# Patient Record
Sex: Male | Born: 1935 | Race: Black or African American | Hispanic: No | Marital: Married | Smoking: Former smoker
Health system: Southern US, Community
[De-identification: ages and names within clinical notes are randomized; demographics above are authoritative.]

## PROBLEM LIST (undated history)

## (undated) DIAGNOSIS — I10 Essential (primary) hypertension: Secondary | ICD-10-CM

## (undated) DIAGNOSIS — M1990 Unspecified osteoarthritis, unspecified site: Secondary | ICD-10-CM

## (undated) DIAGNOSIS — I2510 Atherosclerotic heart disease of native coronary artery without angina pectoris: Secondary | ICD-10-CM

## (undated) DIAGNOSIS — F32A Depression, unspecified: Secondary | ICD-10-CM

## (undated) HISTORY — PX: CARDIAC CATHETERIZATION: SHX172

## (undated) HISTORY — DX: Depression, unspecified: F32.A

---

## 2014-08-31 DIAGNOSIS — I10 Essential (primary) hypertension: Secondary | ICD-10-CM | POA: Insufficient documentation

## 2014-08-31 DIAGNOSIS — E785 Hyperlipidemia, unspecified: Secondary | ICD-10-CM | POA: Insufficient documentation

## 2015-01-14 DIAGNOSIS — H401112 Primary open-angle glaucoma, right eye, moderate stage: Secondary | ICD-10-CM | POA: Diagnosis not present

## 2015-01-14 DIAGNOSIS — H5203 Hypermetropia, bilateral: Secondary | ICD-10-CM | POA: Diagnosis not present

## 2015-02-25 DIAGNOSIS — I10 Essential (primary) hypertension: Secondary | ICD-10-CM | POA: Diagnosis not present

## 2015-02-25 DIAGNOSIS — I25119 Atherosclerotic heart disease of native coronary artery with unspecified angina pectoris: Secondary | ICD-10-CM | POA: Diagnosis not present

## 2015-02-25 DIAGNOSIS — E785 Hyperlipidemia, unspecified: Secondary | ICD-10-CM | POA: Diagnosis not present

## 2015-03-01 DIAGNOSIS — I2510 Atherosclerotic heart disease of native coronary artery without angina pectoris: Secondary | ICD-10-CM | POA: Diagnosis not present

## 2015-03-01 DIAGNOSIS — E785 Hyperlipidemia, unspecified: Secondary | ICD-10-CM | POA: Diagnosis not present

## 2015-03-01 DIAGNOSIS — I10 Essential (primary) hypertension: Secondary | ICD-10-CM | POA: Diagnosis not present

## 2015-03-15 DIAGNOSIS — I159 Secondary hypertension, unspecified: Secondary | ICD-10-CM | POA: Diagnosis not present

## 2015-04-20 DIAGNOSIS — H25019 Cortical age-related cataract, unspecified eye: Secondary | ICD-10-CM | POA: Diagnosis not present

## 2015-04-20 DIAGNOSIS — H401112 Primary open-angle glaucoma, right eye, moderate stage: Secondary | ICD-10-CM | POA: Diagnosis not present

## 2015-07-28 DIAGNOSIS — H401112 Primary open-angle glaucoma, right eye, moderate stage: Secondary | ICD-10-CM | POA: Diagnosis not present

## 2015-07-28 DIAGNOSIS — H2513 Age-related nuclear cataract, bilateral: Secondary | ICD-10-CM | POA: Diagnosis not present

## 2015-08-30 DIAGNOSIS — E785 Hyperlipidemia, unspecified: Secondary | ICD-10-CM | POA: Diagnosis not present

## 2015-08-30 DIAGNOSIS — I2510 Atherosclerotic heart disease of native coronary artery without angina pectoris: Secondary | ICD-10-CM | POA: Diagnosis not present

## 2015-08-30 DIAGNOSIS — I10 Essential (primary) hypertension: Secondary | ICD-10-CM | POA: Diagnosis not present

## 2015-09-28 DIAGNOSIS — Z23 Encounter for immunization: Secondary | ICD-10-CM | POA: Diagnosis not present

## 2015-09-28 DIAGNOSIS — Z86010 Personal history of colonic polyps: Secondary | ICD-10-CM | POA: Diagnosis not present

## 2015-09-28 DIAGNOSIS — I10 Essential (primary) hypertension: Secondary | ICD-10-CM | POA: Diagnosis not present

## 2015-09-28 DIAGNOSIS — K219 Gastro-esophageal reflux disease without esophagitis: Secondary | ICD-10-CM | POA: Diagnosis not present

## 2015-09-28 DIAGNOSIS — N400 Enlarged prostate without lower urinary tract symptoms: Secondary | ICD-10-CM | POA: Diagnosis not present

## 2015-11-09 DIAGNOSIS — H401112 Primary open-angle glaucoma, right eye, moderate stage: Secondary | ICD-10-CM | POA: Diagnosis not present

## 2015-11-09 DIAGNOSIS — H25019 Cortical age-related cataract, unspecified eye: Secondary | ICD-10-CM | POA: Diagnosis not present

## 2015-11-09 DIAGNOSIS — H2513 Age-related nuclear cataract, bilateral: Secondary | ICD-10-CM | POA: Diagnosis not present

## 2015-12-14 DIAGNOSIS — Z1211 Encounter for screening for malignant neoplasm of colon: Secondary | ICD-10-CM | POA: Diagnosis not present

## 2015-12-14 DIAGNOSIS — Z86010 Personal history of colonic polyps: Secondary | ICD-10-CM | POA: Diagnosis not present

## 2016-01-19 DIAGNOSIS — I2510 Atherosclerotic heart disease of native coronary artery without angina pectoris: Secondary | ICD-10-CM | POA: Diagnosis not present

## 2016-01-19 DIAGNOSIS — Z7902 Long term (current) use of antithrombotics/antiplatelets: Secondary | ICD-10-CM | POA: Diagnosis not present

## 2016-01-19 DIAGNOSIS — I10 Essential (primary) hypertension: Secondary | ICD-10-CM | POA: Diagnosis not present

## 2016-01-19 DIAGNOSIS — K5730 Diverticulosis of large intestine without perforation or abscess without bleeding: Secondary | ICD-10-CM | POA: Diagnosis not present

## 2016-01-19 DIAGNOSIS — H409 Unspecified glaucoma: Secondary | ICD-10-CM | POA: Diagnosis not present

## 2016-01-19 DIAGNOSIS — Z87891 Personal history of nicotine dependence: Secondary | ICD-10-CM | POA: Diagnosis not present

## 2016-01-19 DIAGNOSIS — Z79899 Other long term (current) drug therapy: Secondary | ICD-10-CM | POA: Diagnosis not present

## 2016-01-19 DIAGNOSIS — Z7982 Long term (current) use of aspirin: Secondary | ICD-10-CM | POA: Diagnosis not present

## 2016-01-19 DIAGNOSIS — Z1211 Encounter for screening for malignant neoplasm of colon: Secondary | ICD-10-CM | POA: Diagnosis not present

## 2016-01-19 DIAGNOSIS — Z86010 Personal history of colonic polyps: Secondary | ICD-10-CM | POA: Diagnosis not present

## 2016-01-19 DIAGNOSIS — K5790 Diverticulosis of intestine, part unspecified, without perforation or abscess without bleeding: Secondary | ICD-10-CM | POA: Diagnosis not present

## 2016-02-11 DIAGNOSIS — H2513 Age-related nuclear cataract, bilateral: Secondary | ICD-10-CM | POA: Diagnosis not present

## 2016-02-11 DIAGNOSIS — H5203 Hypermetropia, bilateral: Secondary | ICD-10-CM | POA: Diagnosis not present

## 2016-02-11 DIAGNOSIS — H401112 Primary open-angle glaucoma, right eye, moderate stage: Secondary | ICD-10-CM | POA: Diagnosis not present

## 2016-03-06 DIAGNOSIS — E785 Hyperlipidemia, unspecified: Secondary | ICD-10-CM | POA: Diagnosis not present

## 2016-03-06 DIAGNOSIS — I10 Essential (primary) hypertension: Secondary | ICD-10-CM | POA: Diagnosis not present

## 2016-03-06 DIAGNOSIS — I2510 Atherosclerotic heart disease of native coronary artery without angina pectoris: Secondary | ICD-10-CM | POA: Diagnosis not present

## 2016-04-28 DIAGNOSIS — Z1389 Encounter for screening for other disorder: Secondary | ICD-10-CM | POA: Diagnosis not present

## 2016-04-28 DIAGNOSIS — Z79899 Other long term (current) drug therapy: Secondary | ICD-10-CM | POA: Diagnosis not present

## 2016-04-28 DIAGNOSIS — N400 Enlarged prostate without lower urinary tract symptoms: Secondary | ICD-10-CM | POA: Diagnosis not present

## 2016-04-28 DIAGNOSIS — Z0000 Encounter for general adult medical examination without abnormal findings: Secondary | ICD-10-CM | POA: Diagnosis not present

## 2016-04-28 DIAGNOSIS — I10 Essential (primary) hypertension: Secondary | ICD-10-CM | POA: Diagnosis not present

## 2016-04-28 DIAGNOSIS — Z6826 Body mass index (BMI) 26.0-26.9, adult: Secondary | ICD-10-CM | POA: Diagnosis not present

## 2016-04-28 DIAGNOSIS — E785 Hyperlipidemia, unspecified: Secondary | ICD-10-CM | POA: Diagnosis not present

## 2016-04-28 DIAGNOSIS — M79605 Pain in left leg: Secondary | ICD-10-CM | POA: Diagnosis not present

## 2016-04-28 DIAGNOSIS — Z9181 History of falling: Secondary | ICD-10-CM | POA: Diagnosis not present

## 2016-05-30 DIAGNOSIS — M25552 Pain in left hip: Secondary | ICD-10-CM | POA: Diagnosis not present

## 2016-05-30 DIAGNOSIS — R3989 Other symptoms and signs involving the genitourinary system: Secondary | ICD-10-CM | POA: Diagnosis not present

## 2016-05-30 DIAGNOSIS — Z6826 Body mass index (BMI) 26.0-26.9, adult: Secondary | ICD-10-CM | POA: Diagnosis not present

## 2016-06-20 DIAGNOSIS — H25019 Cortical age-related cataract, unspecified eye: Secondary | ICD-10-CM | POA: Diagnosis not present

## 2016-06-20 DIAGNOSIS — H401112 Primary open-angle glaucoma, right eye, moderate stage: Secondary | ICD-10-CM | POA: Diagnosis not present

## 2016-06-20 DIAGNOSIS — H2513 Age-related nuclear cataract, bilateral: Secondary | ICD-10-CM | POA: Diagnosis not present

## 2016-08-31 DIAGNOSIS — I2510 Atherosclerotic heart disease of native coronary artery without angina pectoris: Secondary | ICD-10-CM | POA: Diagnosis not present

## 2016-08-31 DIAGNOSIS — I10 Essential (primary) hypertension: Secondary | ICD-10-CM | POA: Diagnosis not present

## 2016-08-31 DIAGNOSIS — M1610 Unilateral primary osteoarthritis, unspecified hip: Secondary | ICD-10-CM | POA: Diagnosis not present

## 2016-08-31 DIAGNOSIS — Z7982 Long term (current) use of aspirin: Secondary | ICD-10-CM | POA: Diagnosis not present

## 2016-08-31 DIAGNOSIS — K219 Gastro-esophageal reflux disease without esophagitis: Secondary | ICD-10-CM | POA: Diagnosis not present

## 2016-08-31 DIAGNOSIS — Z955 Presence of coronary angioplasty implant and graft: Secondary | ICD-10-CM | POA: Insufficient documentation

## 2016-08-31 DIAGNOSIS — E785 Hyperlipidemia, unspecified: Secondary | ICD-10-CM | POA: Diagnosis not present

## 2016-09-13 DIAGNOSIS — Z7982 Long term (current) use of aspirin: Secondary | ICD-10-CM | POA: Diagnosis not present

## 2016-09-13 DIAGNOSIS — Z955 Presence of coronary angioplasty implant and graft: Secondary | ICD-10-CM | POA: Diagnosis not present

## 2016-09-13 DIAGNOSIS — R110 Nausea: Secondary | ICD-10-CM | POA: Diagnosis not present

## 2016-09-13 DIAGNOSIS — R55 Syncope and collapse: Secondary | ICD-10-CM | POA: Diagnosis not present

## 2016-09-13 DIAGNOSIS — I10 Essential (primary) hypertension: Secondary | ICD-10-CM | POA: Diagnosis not present

## 2016-09-13 DIAGNOSIS — I2510 Atherosclerotic heart disease of native coronary artery without angina pectoris: Secondary | ICD-10-CM | POA: Diagnosis not present

## 2016-09-13 DIAGNOSIS — Z79899 Other long term (current) drug therapy: Secondary | ICD-10-CM | POA: Diagnosis not present

## 2016-09-13 DIAGNOSIS — Z87891 Personal history of nicotine dependence: Secondary | ICD-10-CM | POA: Diagnosis not present

## 2016-09-13 DIAGNOSIS — R404 Transient alteration of awareness: Secondary | ICD-10-CM | POA: Diagnosis not present

## 2016-09-19 ENCOUNTER — Emergency Department (HOSPITAL_COMMUNITY): Payer: PPO

## 2016-09-19 ENCOUNTER — Emergency Department (HOSPITAL_COMMUNITY)
Admission: EM | Admit: 2016-09-19 | Discharge: 2016-09-19 | Disposition: A | Discharge status: Home / Self Care | Payer: PPO | Attending: Emergency Medicine | Admitting: Emergency Medicine

## 2016-09-19 ENCOUNTER — Other Ambulatory Visit: Payer: Self-pay

## 2016-09-19 ENCOUNTER — Encounter (HOSPITAL_COMMUNITY): Payer: Self-pay

## 2016-09-19 DIAGNOSIS — Z87891 Personal history of nicotine dependence: Secondary | ICD-10-CM | POA: Diagnosis not present

## 2016-09-19 DIAGNOSIS — R55 Syncope and collapse: Secondary | ICD-10-CM

## 2016-09-19 DIAGNOSIS — I10 Essential (primary) hypertension: Secondary | ICD-10-CM | POA: Insufficient documentation

## 2016-09-19 DIAGNOSIS — R4020 Unspecified coma: Secondary | ICD-10-CM | POA: Diagnosis not present

## 2016-09-19 DIAGNOSIS — Z7982 Long term (current) use of aspirin: Secondary | ICD-10-CM | POA: Diagnosis not present

## 2016-09-19 DIAGNOSIS — Z79899 Other long term (current) drug therapy: Secondary | ICD-10-CM | POA: Insufficient documentation

## 2016-09-19 DIAGNOSIS — R404 Transient alteration of awareness: Secondary | ICD-10-CM | POA: Diagnosis not present

## 2016-09-19 DIAGNOSIS — R112 Nausea with vomiting, unspecified: Secondary | ICD-10-CM | POA: Diagnosis not present

## 2016-09-19 DIAGNOSIS — R42 Dizziness and giddiness: Secondary | ICD-10-CM | POA: Diagnosis not present

## 2016-09-19 DIAGNOSIS — I2510 Atherosclerotic heart disease of native coronary artery without angina pectoris: Secondary | ICD-10-CM | POA: Insufficient documentation

## 2016-09-19 HISTORY — DX: Unspecified osteoarthritis, unspecified site: M19.90

## 2016-09-19 HISTORY — DX: Atherosclerotic heart disease of native coronary artery without angina pectoris: I25.10

## 2016-09-19 HISTORY — DX: Essential (primary) hypertension: I10

## 2016-09-19 LAB — CBC
HCT: 43.7 % (ref 39.0–52.0)
Hemoglobin: 14.2 g/dL (ref 13.0–17.0)
MCH: 27 pg (ref 26.0–34.0)
MCHC: 32.5 g/dL (ref 30.0–36.0)
MCV: 83.2 fL (ref 78.0–100.0)
Platelets: 210 10*3/uL (ref 150–400)
RBC: 5.25 MIL/uL (ref 4.22–5.81)
RDW: 14.2 % (ref 11.5–15.5)
WBC: 10.5 10*3/uL (ref 4.0–10.5)

## 2016-09-19 LAB — URINALYSIS, ROUTINE W REFLEX MICROSCOPIC
BILIRUBIN URINE: NEGATIVE
Glucose, UA: NEGATIVE mg/dL
HGB URINE DIPSTICK: NEGATIVE
Ketones, ur: 5 mg/dL — AB
Leukocytes, UA: NEGATIVE
NITRITE: NEGATIVE
PROTEIN: NEGATIVE mg/dL
Specific Gravity, Urine: 1.012 (ref 1.005–1.030)
pH: 5 (ref 5.0–8.0)

## 2016-09-19 LAB — BASIC METABOLIC PANEL
Anion gap: 8 (ref 5–15)
BUN: 13 mg/dL (ref 6–20)
CALCIUM: 9.6 mg/dL (ref 8.9–10.3)
CO2: 27 mmol/L (ref 22–32)
Chloride: 104 mmol/L (ref 101–111)
Creatinine, Ser: 0.97 mg/dL (ref 0.61–1.24)
GFR calc Af Amer: >60 mL/min (ref >60)
GLUCOSE: 114 mg/dL — AB (ref 65–99)
Potassium: 4 mmol/L (ref 3.5–5.1)
Sodium: 139 mmol/L (ref 135–145)

## 2016-09-19 LAB — CBG MONITORING, ED: GLUCOSE-CAPILLARY: 109 mg/dL — AB (ref 65–99)

## 2016-09-19 LAB — TROPONIN I: Troponin I: <0.03 ng/mL (ref <0.03)

## 2016-09-19 NOTE — ED Notes (Signed)
Patient transported to X-ray 

## 2016-09-19 NOTE — ED Provider Notes (Signed)
Emergency Department Provider Note   I have reviewed the triage vital signs and the nursing notes.   HISTORY  Chief Complaint Loss of Consciousness   HPI Joel Peck is a 81 y.o. male with a past history of coronary artery disease, hypertension and arthritis presents to the emergency department today for a second episode of syncope. Patient states that last week on Tuesday he walked to the back of his yard and then came back up again felt a weird sensation in his body. He sat down and subsequently syncopized for approximately minute and did have some urinary incontinence with it. He woke up with Dr. normal quickly in about 5 minutes later he had a bowel movement. He went to [facility] where he reportedly has CT, EKG and labs done which were all unremarkable per the patient's report. Even taking it easy for the last few days with concern over what had happened and then today he was talking to a neighbor and he had a feeling in his head and the urge to defecate and went set on the porch and syncopized again once again this only lasted about a minute and he came back to normal quickly. He did urinate on himself again this time. No post ictal state with other one. No chest pain, short of breath, back pain, abdominal pain or headache with either one. Never had episodes like this before. Has been eating and drinking normally. No recent illnesses.   Past Medical History:  Diagnosis Date  . Arthritis   . Coronary artery disease   . Hypertension     There are no active problems to display for this patient.   History reviewed. No pertinent surgical history.  Current Outpatient Rx  . Order #: [id number] Class: Historical Med  . Order #: [id number] Class: Historical Med  . Order #: [id number] Class: Historical Med  . Order #: [id number] Class: Historical Med  . Order #: [id number] Class: Historical Med  . Order #: [id number] Class: Historical Med  . Order #: [id number] Class: Historical Med  . Order  #: [id number] Class: Historical Med  . Order #: [id number] Class: Historical Med  . Order #: [id number] Class: Historical Med  . Order #: [id number] Class: Historical Med    Allergies Patient has no known allergies.  No family history on file.  Social History Social History  Substance Use Topics  . Smoking status: Former Research scientist (life sciences)  . Smokeless tobacco: Never Used  . Alcohol use No    Review of Systems  All other systems negative except as documented in the HPI. All pertinent positives and negatives as reviewed in the HPI. ____________________________________________   PHYSICAL EXAM:  VITAL SIGNS: ED Triage Vitals  Enc Vitals Group     BP 09/19/16 1306 (!) 154/64     Pulse Rate 09/19/16 1306 64     Resp 09/19/16 1306 18     Temp 09/19/16 1306 97.8 F (36.6 C)     Temp Source 09/19/16 1306 Oral     SpO2 09/19/16 1306 99 %     Weight 09/19/16 1307 169 lb 5 oz (76.8 kg)     Height 09/19/16 1307 5\' 7"  (1.702 m)     Head Circumference --      Peak Flow --      Pain Score --      Pain Loc --      Pain Edu? --      Excl. in [place]? --     Constitutional: Alert and oriented. Well appearing and in no acute distress. Eyes:  Conjunctivae are normal. PERRL. EOMI. Head: Atraumatic. Nose: No congestion/rhinnorhea. Mouth/Throat: Mucous membranes are moist.  Oropharynx non-erythematous. Neck: No stridor.  No meningeal signs.   Cardiovascular: Normal rate, regular rhythm. Good peripheral circulation.  I/VI heart murmur Respiratory: Normal respiratory effort.  No retractions. Lungs CTAB. Gastrointestinal: Soft and nontender. No distention.  Musculoskeletal: No lower extremity tenderness nor edema. No gross deformities of extremities. Neurologic:  Normal speech and language. No gross focal neurologic deficits are appreciated. Ambulates normally Skin:  Skin is warm, dry and intact. No rash noted.   ____________________________________________   LABS (all labs ordered are listed, but only  abnormal results are displayed)  Labs Reviewed  BASIC METABOLIC PANEL - Abnormal; Notable for the following:       Result Value   Glucose, Bld 114 (*)    All other components within normal limits  URINALYSIS, ROUTINE W REFLEX MICROSCOPIC - Abnormal; Notable for the following:    Ketones, ur 5 (*)    All other components within normal limits  CBG MONITORING, ED - Abnormal; Notable for the following:    Glucose-Capillary 109 (*)    All other components within normal limits  CBC  TROPONIN I   ____________________________________________  EKG   EKG Interpretation  Date/Time:  Tuesday September 19 2016 13:10:45 EDT Ventricular Rate:  66 PR Interval:  158 QRS Duration: 88 QT Interval:  384 QTC Calculation: 402 R Axis:   37 Text Interpretation:  Poor data quality, interpretation may be adversely affected Normal sinus rhythm Cannot rule out Inferior infarct , age undetermined Cannot rule out Anterior infarct , age undetermined Abnormal ECG No old tracing to compare Confirmed by Merrily Pew [phone]) on 09/19/2016 4:24:55 PM       ____________________________________________  RADIOLOGY  Dg Chest 2 View  Result Date: 09/19/2016 CLINICAL DATA:  Lightheaded, nausea and vomiting. EXAM: CHEST  2 VIEW COMPARISON:  None. FINDINGS: Cardiomediastinal silhouette is normal in size and configuration. Lungs are clear. Lung volumes are normal. No evidence of pneumonia. No pleural effusion. No pneumothorax seen. Osseous and soft tissue structures about the chest are unremarkable. Mild degenerative spurring noted within the thoracic spine. IMPRESSION: No active cardiopulmonary disease. No evidence of pneumonia or pulmonary edema. Electronically Signed   By: Franki Cabot M.D.   On: 09/19/2016 19:06   Ct Head Wo Contrast  Result Date: 09/19/2016 CLINICAL DATA:  Altered level of consciousness. EXAM: CT HEAD WITHOUT CONTRAST TECHNIQUE: Contiguous axial images were obtained from the base of the skull  through the vertex without intravenous contrast. COMPARISON:  None. FINDINGS: Brain: Mild generalized age related parenchymal atrophy with commensurate dilatation of the ventricles and sulci. No mass, hemorrhage, edema or other evidence of acute parenchymal abnormality. No extra-axial hemorrhage. Vascular: There are chronic calcified atherosclerotic changes of the large vessels at the skull base. No unexpected hyperdense vessel. Skull: Normal. Negative for fracture or focal lesion. Sinuses/Orbits: No acute finding. Other: None. IMPRESSION: No acute findings.  No intracranial mass, hemorrhage or edema. Electronically Signed   By: Franki Cabot M.D.   On: 09/19/2016 18:39    ____________________________________________   PROCEDURES  Procedure(s) performed:   Procedures   ____________________________________________   INITIAL IMPRESSION / ASSESSMENT AND PLAN / ED COURSE  Pertinent labs & imaging results that were available during my care of the patient were reviewed by me and considered in my medical decision making (see chart for details).  81 yo M presented with syncope lasting approximately one minute and associated with feeling of  BM. Pertinent physical exam findings include none and pertinent labs and imaging negative. This is most consistent with vasovagal syncopal episode. Also on the differential diagnosis was seizure, TIA/Stroke, hypoglycemia, narcolepsy. Doubt the patient had a seizure without a history of seizure, no post-ictal state or incontinence. history not consistent with stroke or TIA. Hypoglycemia unlikely as patient improved without intervention and had normal glucose. For syncope, there are many possible causes however based on history and physical I feel like cardiac causes are unlikely with normal ECG (as read above), no new murmurs and no evidence of arrhythmias while on prolonged (8 hours) continuous monitoring here. Patient not hypotensive here and upon review of the  records there are no drugs/medical problems that would point toward this as a cause. Unsure of actual cause of syncope, however I think it is most consistent with a vasovagal type of syncope and emergent causes are unlikely at this time and they can continue workup as an outpatient with cardiologist.    ____________________________________________  FINAL CLINICAL IMPRESSION(S) / ED DIAGNOSES  Final diagnoses:  Syncope and collapse     MEDICATIONS GIVEN DURING THIS VISIT:  Medications - No data to display   NEW OUTPATIENT MEDICATIONS STARTED DURING THIS VISIT:  New Prescriptions   No medications on file    Note:  This document was prepared using Dragon voice recognition software and may include unintentional dictation errors.   Merrily Pew, MD 09/19/16 2059

## 2016-09-19 NOTE — ED Triage Notes (Signed)
Pt. Was in his yard talking with his neighbor and began to feel lightheaded.  He sat down on the porch and the next thing he remembers is his wife by his side.  He became nauseated and vomited. He arrived via [place] and denies any chest pain, sob, n/v.  He is alert and oriented X4.  Skin is warm and dry

## 2016-09-19 NOTE — ED Notes (Signed)
Pt. Reports that he had a syncopal epidsode 1 week ago and he went to [facility].  They did testing all him CT head, ECG and labs everything came back normal.

## 2016-09-21 DIAGNOSIS — R55 Syncope and collapse: Secondary | ICD-10-CM | POA: Diagnosis not present

## 2016-09-21 DIAGNOSIS — R42 Dizziness and giddiness: Secondary | ICD-10-CM | POA: Diagnosis not present

## 2016-09-21 DIAGNOSIS — I10 Essential (primary) hypertension: Secondary | ICD-10-CM | POA: Diagnosis not present

## 2016-09-21 DIAGNOSIS — I2510 Atherosclerotic heart disease of native coronary artery without angina pectoris: Secondary | ICD-10-CM | POA: Diagnosis not present

## 2016-09-22 DIAGNOSIS — R42 Dizziness and giddiness: Secondary | ICD-10-CM | POA: Diagnosis not present

## 2016-09-22 DIAGNOSIS — R55 Syncope and collapse: Secondary | ICD-10-CM | POA: Diagnosis not present

## 2016-09-25 DIAGNOSIS — Z6825 Body mass index (BMI) 25.0-25.9, adult: Secondary | ICD-10-CM | POA: Diagnosis not present

## 2016-09-25 DIAGNOSIS — R55 Syncope and collapse: Secondary | ICD-10-CM | POA: Diagnosis not present

## 2016-10-18 DIAGNOSIS — R42 Dizziness and giddiness: Secondary | ICD-10-CM | POA: Diagnosis not present

## 2016-10-18 DIAGNOSIS — R55 Syncope and collapse: Secondary | ICD-10-CM | POA: Diagnosis not present

## 2016-11-03 DIAGNOSIS — I6529 Occlusion and stenosis of unspecified carotid artery: Secondary | ICD-10-CM | POA: Insufficient documentation

## 2016-11-06 DIAGNOSIS — Z955 Presence of coronary angioplasty implant and graft: Secondary | ICD-10-CM | POA: Diagnosis not present

## 2016-11-06 DIAGNOSIS — I2510 Atherosclerotic heart disease of native coronary artery without angina pectoris: Secondary | ICD-10-CM | POA: Diagnosis not present

## 2016-11-06 DIAGNOSIS — I10 Essential (primary) hypertension: Secondary | ICD-10-CM | POA: Diagnosis not present

## 2016-11-06 DIAGNOSIS — I6529 Occlusion and stenosis of unspecified carotid artery: Secondary | ICD-10-CM | POA: Diagnosis not present

## 2016-11-15 DIAGNOSIS — H25019 Cortical age-related cataract, unspecified eye: Secondary | ICD-10-CM | POA: Diagnosis not present

## 2016-11-15 DIAGNOSIS — H2513 Age-related nuclear cataract, bilateral: Secondary | ICD-10-CM | POA: Diagnosis not present

## 2016-11-15 DIAGNOSIS — H401112 Primary open-angle glaucoma, right eye, moderate stage: Secondary | ICD-10-CM | POA: Diagnosis not present

## 2016-12-15 DIAGNOSIS — H3561 Retinal hemorrhage, right eye: Secondary | ICD-10-CM | POA: Diagnosis not present

## 2017-02-22 DIAGNOSIS — H1132 Conjunctival hemorrhage, left eye: Secondary | ICD-10-CM | POA: Diagnosis not present

## 2017-03-15 DIAGNOSIS — H401112 Primary open-angle glaucoma, right eye, moderate stage: Secondary | ICD-10-CM | POA: Diagnosis not present

## 2017-03-22 DIAGNOSIS — E785 Hyperlipidemia, unspecified: Secondary | ICD-10-CM | POA: Diagnosis not present

## 2017-03-22 DIAGNOSIS — M1990 Unspecified osteoarthritis, unspecified site: Secondary | ICD-10-CM | POA: Diagnosis not present

## 2017-03-22 DIAGNOSIS — Z79899 Other long term (current) drug therapy: Secondary | ICD-10-CM | POA: Diagnosis not present

## 2017-03-22 DIAGNOSIS — H6122 Impacted cerumen, left ear: Secondary | ICD-10-CM | POA: Diagnosis not present

## 2017-03-22 DIAGNOSIS — Z6826 Body mass index (BMI) 26.0-26.9, adult: Secondary | ICD-10-CM | POA: Diagnosis not present

## 2017-03-22 DIAGNOSIS — I10 Essential (primary) hypertension: Secondary | ICD-10-CM | POA: Diagnosis not present

## 2017-03-22 DIAGNOSIS — Z23 Encounter for immunization: Secondary | ICD-10-CM | POA: Diagnosis not present

## 2017-03-22 DIAGNOSIS — Z1339 Encounter for screening examination for other mental health and behavioral disorders: Secondary | ICD-10-CM | POA: Diagnosis not present

## 2017-03-22 DIAGNOSIS — N400 Enlarged prostate without lower urinary tract symptoms: Secondary | ICD-10-CM | POA: Diagnosis not present

## 2017-03-30 DIAGNOSIS — R972 Elevated prostate specific antigen [PSA]: Secondary | ICD-10-CM | POA: Diagnosis not present

## 2017-03-30 DIAGNOSIS — N401 Enlarged prostate with lower urinary tract symptoms: Secondary | ICD-10-CM | POA: Diagnosis not present

## 2017-04-27 DIAGNOSIS — R972 Elevated prostate specific antigen [PSA]: Secondary | ICD-10-CM | POA: Diagnosis not present

## 2017-04-27 DIAGNOSIS — N401 Enlarged prostate with lower urinary tract symptoms: Secondary | ICD-10-CM | POA: Diagnosis not present

## 2017-05-09 DIAGNOSIS — I2510 Atherosclerotic heart disease of native coronary artery without angina pectoris: Secondary | ICD-10-CM | POA: Diagnosis not present

## 2017-05-09 DIAGNOSIS — I6529 Occlusion and stenosis of unspecified carotid artery: Secondary | ICD-10-CM | POA: Diagnosis not present

## 2017-05-09 DIAGNOSIS — E785 Hyperlipidemia, unspecified: Secondary | ICD-10-CM | POA: Diagnosis not present

## 2017-05-09 DIAGNOSIS — I10 Essential (primary) hypertension: Secondary | ICD-10-CM | POA: Diagnosis not present

## 2017-06-20 DIAGNOSIS — M25552 Pain in left hip: Secondary | ICD-10-CM | POA: Diagnosis not present

## 2017-06-20 DIAGNOSIS — Z6826 Body mass index (BMI) 26.0-26.9, adult: Secondary | ICD-10-CM | POA: Diagnosis not present

## 2017-07-12 DIAGNOSIS — H401112 Primary open-angle glaucoma, right eye, moderate stage: Secondary | ICD-10-CM | POA: Diagnosis not present

## 2017-08-01 DIAGNOSIS — I10 Essential (primary) hypertension: Secondary | ICD-10-CM | POA: Diagnosis not present

## 2017-08-01 DIAGNOSIS — E785 Hyperlipidemia, unspecified: Secondary | ICD-10-CM | POA: Diagnosis not present

## 2017-08-07 DIAGNOSIS — N401 Enlarged prostate with lower urinary tract symptoms: Secondary | ICD-10-CM | POA: Diagnosis not present

## 2017-08-07 DIAGNOSIS — R351 Nocturia: Secondary | ICD-10-CM | POA: Diagnosis not present

## 2017-08-07 DIAGNOSIS — Z7689 Persons encountering health services in other specified circumstances: Secondary | ICD-10-CM | POA: Diagnosis not present

## 2017-09-12 DIAGNOSIS — H401112 Primary open-angle glaucoma, right eye, moderate stage: Secondary | ICD-10-CM | POA: Diagnosis not present

## 2017-09-12 DIAGNOSIS — H401122 Primary open-angle glaucoma, left eye, moderate stage: Secondary | ICD-10-CM | POA: Diagnosis not present

## 2017-10-03 DIAGNOSIS — H401112 Primary open-angle glaucoma, right eye, moderate stage: Secondary | ICD-10-CM | POA: Diagnosis not present

## 2017-10-03 DIAGNOSIS — H401122 Primary open-angle glaucoma, left eye, moderate stage: Secondary | ICD-10-CM | POA: Diagnosis not present

## 2017-11-07 DIAGNOSIS — R351 Nocturia: Secondary | ICD-10-CM | POA: Diagnosis not present

## 2017-11-07 DIAGNOSIS — N401 Enlarged prostate with lower urinary tract symptoms: Secondary | ICD-10-CM | POA: Diagnosis not present

## 2017-11-09 DIAGNOSIS — I10 Essential (primary) hypertension: Secondary | ICD-10-CM | POA: Diagnosis not present

## 2017-11-09 DIAGNOSIS — I2510 Atherosclerotic heart disease of native coronary artery without angina pectoris: Secondary | ICD-10-CM | POA: Diagnosis not present

## 2017-11-09 DIAGNOSIS — I6529 Occlusion and stenosis of unspecified carotid artery: Secondary | ICD-10-CM | POA: Diagnosis not present

## 2017-11-09 DIAGNOSIS — E785 Hyperlipidemia, unspecified: Secondary | ICD-10-CM | POA: Diagnosis not present

## 2017-11-13 DIAGNOSIS — Z1331 Encounter for screening for depression: Secondary | ICD-10-CM | POA: Diagnosis not present

## 2017-11-13 DIAGNOSIS — Z1339 Encounter for screening examination for other mental health and behavioral disorders: Secondary | ICD-10-CM | POA: Diagnosis not present

## 2017-11-13 DIAGNOSIS — I10 Essential (primary) hypertension: Secondary | ICD-10-CM | POA: Diagnosis not present

## 2017-11-13 DIAGNOSIS — E7800 Pure hypercholesterolemia, unspecified: Secondary | ICD-10-CM | POA: Diagnosis not present

## 2017-11-13 DIAGNOSIS — E785 Hyperlipidemia, unspecified: Secondary | ICD-10-CM | POA: Diagnosis not present

## 2017-11-13 DIAGNOSIS — M25552 Pain in left hip: Secondary | ICD-10-CM | POA: Diagnosis not present

## 2017-11-13 DIAGNOSIS — Z79899 Other long term (current) drug therapy: Secondary | ICD-10-CM | POA: Diagnosis not present

## 2017-11-13 DIAGNOSIS — Z6826 Body mass index (BMI) 26.0-26.9, adult: Secondary | ICD-10-CM | POA: Diagnosis not present

## 2017-11-13 DIAGNOSIS — Z0000 Encounter for general adult medical examination without abnormal findings: Secondary | ICD-10-CM | POA: Diagnosis not present

## 2017-11-13 DIAGNOSIS — R42 Dizziness and giddiness: Secondary | ICD-10-CM | POA: Diagnosis not present

## 2017-11-28 DIAGNOSIS — Z23 Encounter for immunization: Secondary | ICD-10-CM | POA: Diagnosis not present

## 2018-01-08 DIAGNOSIS — Z79899 Other long term (current) drug therapy: Secondary | ICD-10-CM | POA: Diagnosis not present

## 2018-01-08 DIAGNOSIS — E785 Hyperlipidemia, unspecified: Secondary | ICD-10-CM | POA: Diagnosis not present

## 2018-01-08 DIAGNOSIS — I6523 Occlusion and stenosis of bilateral carotid arteries: Secondary | ICD-10-CM | POA: Diagnosis not present

## 2018-01-08 DIAGNOSIS — I2510 Atherosclerotic heart disease of native coronary artery without angina pectoris: Secondary | ICD-10-CM | POA: Diagnosis not present

## 2018-01-08 DIAGNOSIS — Z7982 Long term (current) use of aspirin: Secondary | ICD-10-CM | POA: Diagnosis not present

## 2018-01-08 DIAGNOSIS — Z955 Presence of coronary angioplasty implant and graft: Secondary | ICD-10-CM | POA: Diagnosis not present

## 2018-01-08 DIAGNOSIS — I10 Essential (primary) hypertension: Secondary | ICD-10-CM | POA: Diagnosis not present

## 2018-01-28 DIAGNOSIS — J209 Acute bronchitis, unspecified: Secondary | ICD-10-CM | POA: Diagnosis not present

## 2018-03-05 DIAGNOSIS — H401112 Primary open-angle glaucoma, right eye, moderate stage: Secondary | ICD-10-CM | POA: Diagnosis not present

## 2018-03-05 DIAGNOSIS — H401122 Primary open-angle glaucoma, left eye, moderate stage: Secondary | ICD-10-CM | POA: Diagnosis not present

## 2018-03-19 DIAGNOSIS — N400 Enlarged prostate without lower urinary tract symptoms: Secondary | ICD-10-CM | POA: Diagnosis not present

## 2018-03-19 DIAGNOSIS — Z6826 Body mass index (BMI) 26.0-26.9, adult: Secondary | ICD-10-CM | POA: Diagnosis not present

## 2018-03-19 DIAGNOSIS — M25552 Pain in left hip: Secondary | ICD-10-CM | POA: Diagnosis not present

## 2018-03-19 DIAGNOSIS — K219 Gastro-esophageal reflux disease without esophagitis: Secondary | ICD-10-CM | POA: Diagnosis not present

## 2018-03-19 DIAGNOSIS — Z9181 History of falling: Secondary | ICD-10-CM | POA: Diagnosis not present

## 2018-04-03 DIAGNOSIS — M25552 Pain in left hip: Secondary | ICD-10-CM | POA: Insufficient documentation

## 2018-04-04 DIAGNOSIS — J301 Allergic rhinitis due to pollen: Secondary | ICD-10-CM | POA: Diagnosis not present

## 2018-04-04 DIAGNOSIS — F330 Major depressive disorder, recurrent, mild: Secondary | ICD-10-CM | POA: Diagnosis not present

## 2018-04-04 DIAGNOSIS — N529 Male erectile dysfunction, unspecified: Secondary | ICD-10-CM | POA: Diagnosis not present

## 2018-04-04 DIAGNOSIS — J3081 Allergic rhinitis due to animal (cat) (dog) hair and dander: Secondary | ICD-10-CM | POA: Diagnosis not present

## 2018-04-04 DIAGNOSIS — M1612 Unilateral primary osteoarthritis, left hip: Secondary | ICD-10-CM | POA: Diagnosis not present

## 2018-04-04 DIAGNOSIS — J3089 Other allergic rhinitis: Secondary | ICD-10-CM | POA: Diagnosis not present

## 2018-04-04 DIAGNOSIS — Z0000 Encounter for general adult medical examination without abnormal findings: Secondary | ICD-10-CM | POA: Diagnosis not present

## 2018-04-04 DIAGNOSIS — Z23 Encounter for immunization: Secondary | ICD-10-CM | POA: Diagnosis not present

## 2018-04-04 DIAGNOSIS — F988 Other specified behavioral and emotional disorders with onset usually occurring in childhood and adolescence: Secondary | ICD-10-CM | POA: Diagnosis not present

## 2018-04-04 DIAGNOSIS — M25552 Pain in left hip: Secondary | ICD-10-CM | POA: Diagnosis not present

## 2018-04-04 DIAGNOSIS — I10 Essential (primary) hypertension: Secondary | ICD-10-CM | POA: Diagnosis not present

## 2018-04-04 DIAGNOSIS — D649 Anemia, unspecified: Secondary | ICD-10-CM | POA: Diagnosis not present

## 2018-04-04 DIAGNOSIS — F17220 Nicotine dependence, chewing tobacco, uncomplicated: Secondary | ICD-10-CM | POA: Diagnosis not present

## 2018-04-10 ENCOUNTER — Telehealth: Payer: Self-pay | Admitting: *Deleted

## 2018-04-10 NOTE — Telephone Encounter (Signed)
   [facility] Pre-operative Risk Assessment    Request for surgical clearance:  1. What type of surgery is being performed? Left Total Hip Arthroplasty  2. When is this surgery scheduled? Date pending clearance  3. What type of clearance is required (medical clearance vs. Pharmacy clearance to hold med vs. Both)? Both Pending Provider Advisement   4. Are there any medications that need to be held prior to surgery and how long? Pending Provider Advisement   5. Practice name and name of physician performing surgery? [facility], Dr. Lyla Glassing  6. What is your office phone number [phone], [phone] Santiago Bur   7.   What is your office fax number [phone], Attn Santiago Bur  8.   Anesthesia type (None, local, MAC, general) ? Spinal Anesthesia    Marlis Edelson 04/10/2018, 10:38 AM  _________________________________________________________________   (provider comments below)

## 2018-04-10 NOTE — Telephone Encounter (Signed)
   Primary Cardiologist: No primary care provider on file.  Chart reviewed as part of pre-operative protocol coverage. Patient was contacted 04/10/2018.    This patient does NOT follow in our practice. He follows with Talbert Cage at [facility], who has already requested he undergo a stress test prior to surgical clearance.    Pre-op covering staff: - Please contact requesting surgeon's office via preferred method (i.e, phone, fax) to inform them of the above.    Tami Lin Duke, PA 04/10/2018, 12:27 PM

## 2018-04-16 NOTE — Telephone Encounter (Signed)
LM with surgical coordinator for [facility] at the number listed in clearance below. Informed that pt is followed by provider with [facility] and has already requested stress test prior to clearance. Asked to call our office with any questions.

## 2018-04-30 DIAGNOSIS — Z01818 Encounter for other preprocedural examination: Secondary | ICD-10-CM | POA: Diagnosis not present

## 2018-04-30 DIAGNOSIS — Z01810 Encounter for preprocedural cardiovascular examination: Secondary | ICD-10-CM | POA: Diagnosis not present

## 2018-05-10 DIAGNOSIS — I10 Essential (primary) hypertension: Secondary | ICD-10-CM | POA: Diagnosis not present

## 2018-05-10 DIAGNOSIS — I6529 Occlusion and stenosis of unspecified carotid artery: Secondary | ICD-10-CM | POA: Diagnosis not present

## 2018-05-10 DIAGNOSIS — E785 Hyperlipidemia, unspecified: Secondary | ICD-10-CM | POA: Diagnosis not present

## 2018-05-10 DIAGNOSIS — I2510 Atherosclerotic heart disease of native coronary artery without angina pectoris: Secondary | ICD-10-CM | POA: Diagnosis not present

## 2018-06-04 DIAGNOSIS — N401 Enlarged prostate with lower urinary tract symptoms: Secondary | ICD-10-CM | POA: Diagnosis not present

## 2018-06-04 DIAGNOSIS — R972 Elevated prostate specific antigen [PSA]: Secondary | ICD-10-CM | POA: Diagnosis not present

## 2018-06-24 ENCOUNTER — Ambulatory Visit: Payer: Self-pay | Admitting: Orthopedic Surgery

## 2018-06-24 NOTE — H&P (Signed)
TOTAL HIP ADMISSION H&P  Patient is admitted for left total hip arthroplasty.  Subjective:  Chief Complaint: left hip pain  HPI: Joel Peck, 83 y.o. male, has a history of pain and functional disability in the left hip(s) due to arthritis and patient has failed non-surgical conservative treatments for greater than 12 weeks to include NSAID's and/or analgesics, flexibility and strengthening excercises, use of assistive devices, weight reduction as appropriate and activity modification.  Onset of symptoms was gradual starting 4 years ago with gradually worsening course since that time.The patient noted no past surgery on the left hip(s).  Patient currently rates pain in the left hip at 10 out of 10 with activity. Patient has night pain, worsening of pain with activity and weight bearing, pain that interfers with activities of daily living and pain with passive range of motion. Patient has evidence of subchondral cysts, subchondral sclerosis, periarticular osteophytes and joint space narrowing by imaging studies. This condition presents safety issues increasing the risk of falls.There is no current active infection.  There are no active problems to display for this patient.  Past Medical History:  Diagnosis Date  . Arthritis   . Coronary artery disease   . Hypertension     No past surgical history on file.  Current Outpatient Medications  Medication Sig Dispense Refill Last Dose  . acetaminophen (TYLENOL) 325 MG tablet Take 325-650 mg by mouth every 6 (six) hours as needed (for muscle soreness or pain).   PRN at PRN  . aspirin (BAYER LOW DOSE) 81 MG EC tablet Take 81 mg by mouth daily. Swallow whole.   09/19/2016 at 0930  . benazepril (LOTENSIN) 20 MG tablet Take 40 mg by mouth daily.   09/18/2016 at am  . celecoxib (CELEBREX) 200 MG capsule Take 200 mg by mouth 2 (two) times daily.   09/18/2016 at pm  . hydrALAZINE (APRESOLINE) 25 MG tablet Take 25 mg by mouth 3 (three) times daily.    09/19/2016 at am  . latanoprost (XALATAN) 0.005 % ophthalmic solution Place 1 drop into both eyes at bedtime.   09/18/2016 at pm  . metoprolol tartrate (LOPRESSOR) 25 MG tablet Take 25 mg by mouth 2 (two) times daily.   09/18/2016 at 1800  . omeprazole (PRILOSEC) 20 MG capsule Take 20 mg by mouth at bedtime.   09/18/2016 at pm  . polycarbophil (FIBERCON) 625 MG tablet Take 625 mg by mouth daily as needed for mild constipation.   PRN at PRN  . rosuvastatin (CRESTOR) 20 MG tablet Take 20 mg by mouth at bedtime.   09/18/2016 at pm  . tamsulosin (FLOMAX) 0.4 MG CAPS capsule Take 0.4 mg by mouth daily.   09/18/2016 at pm   No current facility-administered medications for this visit.    No Known Allergies  Social History   Tobacco Use  . Smoking status: Former Research scientist (life sciences)  . Smokeless tobacco: Never Used  Substance Use Topics  . Alcohol use: No    No family history on file.   Review of Systems  Constitutional: Negative.   HENT: Negative.   Eyes: Negative.   Respiratory: Negative.   Cardiovascular: Negative.   Gastrointestinal: Negative.   Genitourinary: Negative.  Negative for dysuria.  Musculoskeletal: Positive for joint pain.  Skin: Negative.   Neurological: Positive for dizziness.  Endo/Heme/Allergies: Negative.   Psychiatric/Behavioral: Negative.     Objective:  Physical Exam  Vitals reviewed. Constitutional: He is oriented to person, place, and time. He appears well-developed and well-nourished.  HENT:  Head: Normocephalic and atraumatic.  Eyes: Pupils are equal, round, and reactive to light. Conjunctivae and EOM are normal.  Neck: Normal range of motion. Neck supple.  Cardiovascular: Normal rate, regular rhythm and intact distal pulses.  Respiratory: Effort normal. No respiratory distress.  GI: Soft. He exhibits no distension.  Genitourinary:    Genitourinary Comments: deferred   Musculoskeletal:     Left hip: He exhibits decreased range of motion and bony tenderness.   Neurological: He is alert and oriented to person, place, and time. He has normal reflexes.  Skin: Skin is warm and dry.  Psychiatric: He has a normal mood and affect. His behavior is normal. Judgment and thought content normal.    Vital signs in last 24 hours: @VSRANGES @  Labs:   Estimated body mass index is 26.52 kg/m as calculated from the following:   Height as of 09/19/16: 5\' 7"  (1.702 m).   Weight as of 09/19/16: 76.8 kg.   Imaging Review Plain radiographs demonstrate severe degenerative joint disease of the left hip(s). The bone quality appears to be adequate for age and reported activity level.      Assessment/Plan:  End stage arthritis, left hip(s)  The patient history, physical examination, clinical judgement of the provider and imaging studies are consistent with end stage degenerative joint disease of the left hip(s) and total hip arthroplasty is deemed medically necessary. The treatment options including medical management, injection therapy, arthroscopy and arthroplasty were discussed at length. The risks and benefits of total hip arthroplasty were presented and reviewed. The risks due to aseptic loosening, infection, stiffness, dislocation/subluxation,  thromboembolic complications and other imponderables were discussed.  The patient acknowledged the explanation, agreed to proceed with the plan and consent was signed. Patient is being admitted for inpatient treatment for surgery, pain control, PT, OT, prophylactic antibiotics, VTE prophylaxis, progressive ambulation and ADL's and discharge planning.The patient is planning to be discharged home with HEP   Anticipated LOS equal to or greater than 2 midnights due to - Age 69 and older with one or more of the following:  - Obesity  - Expected need for hospital services (PT, OT, Nursing) required for safe  discharge  - Anticipated need for postoperative skilled nursing care or inpatient rehab  - Active co-morbidities:  Coronary Artery Disease OR   - Unanticipated findings during/Post Surgery: None  - Patient is a high risk of re-admission due to: None

## 2018-06-24 NOTE — Patient Instructions (Addendum)
Joel Peck  06/24/2018   Your procedure is scheduled on: Wednesday 07/03/2018  Report to [facility] Main  Entrance             Report to admitting at  0855  AM               YOU NEED TO HAVE A COVID 19 TEST ON 06-29-18 @ 11:30 AM, THIS TEST MUST BE DONE BEFORE SURGERY, COME TO [place] ENTRANCE.    ONCE YOUR COVID TEST IS COMPLETED, PLEASE BEGIN THE QUARANTINE INSTRUCTIONS AS OUTLINED IN YOUR HANDOUT.   Call this number if you have problems the morning of surgery [phone]    Remember: Do not eat food or drink liquids :After Midnight.                Take these medicines the morning of surgery with A SIP OF WATER: Hydralazine (Apresoline), Metoprolol tartrate (Lopressor), Tamulosin (Flomax)    BRUSH YOUR TEETH MORNING OF SURGERY AND RINSE YOUR MOUTH OUT, NO CHEWING GUM CANDY OR MINTS.                                You may not have any metal on your body including hair pins and              piercings     Do not wear jewelry, cologen, lotions, powders or deodorant                          Men may shave face and neck.   Do not bring valuables to the hospital. [place].  Contacts, dentures or bridgework may not be worn into surgery.                   Please read over the following fact sheets you were given: _____________________________________________________________________             [facility] - Preparing for Surgery Before surgery, you can play an important role.  Because skin is not sterile, your skin needs to be as free of germs as possible.  You can reduce the number of germs on your skin by washing with CHG (chlorahexidine gluconate) soap before surgery.  CHG is an antiseptic cleaner which kills germs and bonds with the skin to continue killing germs even after washing. Please DO NOT use if you have an allergy to CHG or antibacterial soaps.  If your skin  becomes reddened/irritated stop using the CHG and inform your nurse when you arrive at Short Stay. Do not shave (including legs and underarms) for at least 48 hours prior to the first CHG shower.  You may shave your face/neck. Please follow these instructions carefully:  1.  Shower with CHG Soap the night before surgery and the  morning of Surgery.  2.  If you choose to wash your hair, wash your hair first as usual with your  normal  shampoo.  3.  After you shampoo, rinse your hair and body thoroughly to remove the  shampoo.  4.  Use CHG as you would any other liquid soap.  You can apply chg directly  to the skin and wash                       Gently with a scrungie or clean washcloth.  5.  Apply the CHG Soap to your body ONLY FROM THE NECK DOWN.   Do not use on face/ open                           Wound or open sores. Avoid contact with eyes, ears mouth and genitals (private parts).                       Wash face,  Genitals (private parts) with your normal soap.             6.  Wash thoroughly, paying special attention to the area where your surgery  will be performed.  7.  Thoroughly rinse your body with warm water from the neck down.  8.  DO NOT shower/wash with your normal soap after using and rinsing off  the CHG Soap.                9.  Pat yourself dry with a clean towel.            10.  Wear clean pajamas.            11.  Place clean sheets on your bed the night of your first shower and do not  sleep with pets. Day of Surgery : Do not apply any lotions/deodorants the morning of surgery.  Please wear clean clothes to the hospital/surgery center.  FAILURE TO FOLLOW THESE INSTRUCTIONS MAY RESULT IN THE CANCELLATION OF YOUR SURGERY PATIENT SIGNATURE_________________________________  NURSE SIGNATURE__________________________________

## 2018-06-24 NOTE — H&P (View-Only) (Signed)
TOTAL HIP ADMISSION H&P  Patient is admitted for left total hip arthroplasty.  Subjective:  Chief Complaint: left hip pain  HPI: Joel Peck, 83 y.o. male, has a history of pain and functional disability in the left hip(s) due to arthritis and patient has failed non-surgical conservative treatments for greater than 12 weeks to include NSAID's and/or analgesics, flexibility and strengthening excercises, use of assistive devices, weight reduction as appropriate and activity modification.  Onset of symptoms was gradual starting 4 years ago with gradually worsening course since that time.The patient noted no past surgery on the left hip(s).  Patient currently rates pain in the left hip at 10 out of 10 with activity. Patient has night pain, worsening of pain with activity and weight bearing, pain that interfers with activities of daily living and pain with passive range of motion. Patient has evidence of subchondral cysts, subchondral sclerosis, periarticular osteophytes and joint space narrowing by imaging studies. This condition presents safety issues increasing the risk of falls.There is no current active infection.  There are no active problems to display for this patient.  Past Medical History:  Diagnosis Date  . Arthritis   . Coronary artery disease   . Hypertension     No past surgical history on file.  Current Outpatient Medications  Medication Sig Dispense Refill Last Dose  . acetaminophen (TYLENOL) 325 MG tablet Take 325-650 mg by mouth every 6 (six) hours as needed (for muscle soreness or pain).   PRN at PRN  . aspirin (BAYER LOW DOSE) 81 MG EC tablet Take 81 mg by mouth daily. Swallow whole.   09/19/2016 at 0930  . benazepril (LOTENSIN) 20 MG tablet Take 40 mg by mouth daily.   09/18/2016 at am  . celecoxib (CELEBREX) 200 MG capsule Take 200 mg by mouth 2 (two) times daily.   09/18/2016 at pm  . hydrALAZINE (APRESOLINE) 25 MG tablet Take 25 mg by mouth 3 (three) times daily.    09/19/2016 at am  . latanoprost (XALATAN) 0.005 % ophthalmic solution Place 1 drop into both eyes at bedtime.   09/18/2016 at pm  . metoprolol tartrate (LOPRESSOR) 25 MG tablet Take 25 mg by mouth 2 (two) times daily.   09/18/2016 at 1800  . omeprazole (PRILOSEC) 20 MG capsule Take 20 mg by mouth at bedtime.   09/18/2016 at pm  . polycarbophil (FIBERCON) 625 MG tablet Take 625 mg by mouth daily as needed for mild constipation.   PRN at PRN  . rosuvastatin (CRESTOR) 20 MG tablet Take 20 mg by mouth at bedtime.   09/18/2016 at pm  . tamsulosin (FLOMAX) 0.4 MG CAPS capsule Take 0.4 mg by mouth daily.   09/18/2016 at pm   No current facility-administered medications for this visit.    No Known Allergies  Social History   Tobacco Use  . Smoking status: Former Research scientist (life sciences)  . Smokeless tobacco: Never Used  Substance Use Topics  . Alcohol use: No    No family history on file.   Review of Systems  Constitutional: Negative.   HENT: Negative.   Eyes: Negative.   Respiratory: Negative.   Cardiovascular: Negative.   Gastrointestinal: Negative.   Genitourinary: Negative.  Negative for dysuria.  Musculoskeletal: Positive for joint pain.  Skin: Negative.   Neurological: Positive for dizziness.  Endo/Heme/Allergies: Negative.   Psychiatric/Behavioral: Negative.     Objective:  Physical Exam  Vitals reviewed. Constitutional: He is oriented to person, place, and time. He appears well-developed and well-nourished.  HENT:  Head: Normocephalic and atraumatic.  Eyes: Pupils are equal, round, and reactive to light. Conjunctivae and EOM are normal.  Neck: Normal range of motion. Neck supple.  Cardiovascular: Normal rate, regular rhythm and intact distal pulses.  Respiratory: Effort normal. No respiratory distress.  GI: Soft. He exhibits no distension.  Genitourinary:    Genitourinary Comments: deferred   Musculoskeletal:     Left hip: He exhibits decreased range of motion and bony tenderness.   Neurological: He is alert and oriented to person, place, and time. He has normal reflexes.  Skin: Skin is warm and dry.  Psychiatric: He has a normal mood and affect. His behavior is normal. Judgment and thought content normal.    Vital signs in last 24 hours: @VSRANGES @  Labs:   Estimated body mass index is 26.52 kg/m as calculated from the following:   Height as of 09/19/16: 5\' 7"  (1.702 m).   Weight as of 09/19/16: 76.8 kg.   Imaging Review Plain radiographs demonstrate severe degenerative joint disease of the left hip(s). The bone quality appears to be adequate for age and reported activity level.      Assessment/Plan:  End stage arthritis, left hip(s)  The patient history, physical examination, clinical judgement of the provider and imaging studies are consistent with end stage degenerative joint disease of the left hip(s) and total hip arthroplasty is deemed medically necessary. The treatment options including medical management, injection therapy, arthroscopy and arthroplasty were discussed at length. The risks and benefits of total hip arthroplasty were presented and reviewed. The risks due to aseptic loosening, infection, stiffness, dislocation/subluxation,  thromboembolic complications and other imponderables were discussed.  The patient acknowledged the explanation, agreed to proceed with the plan and consent was signed. Patient is being admitted for inpatient treatment for surgery, pain control, PT, OT, prophylactic antibiotics, VTE prophylaxis, progressive ambulation and ADL's and discharge planning.The patient is planning to be discharged home with HEP   Anticipated LOS equal to or greater than 2 midnights due to - Age 41 and older with one or more of the following:  - Obesity  - Expected need for hospital services (PT, OT, Nursing) required for safe  discharge  - Anticipated need for postoperative skilled nursing care or inpatient rehab  - Active co-morbidities:  Coronary Artery Disease OR   - Unanticipated findings during/Post Surgery: None  - Patient is a high risk of re-admission due to: None

## 2018-06-25 ENCOUNTER — Encounter (HOSPITAL_COMMUNITY)
Admission: RE | Admit: 2018-06-25 | Discharge: 2018-06-25 | Disposition: A | Discharge status: Home / Self Care | Payer: PPO | Source: Ambulatory Visit | Attending: Orthopedic Surgery | Admitting: Orthopedic Surgery

## 2018-06-25 ENCOUNTER — Other Ambulatory Visit: Payer: Self-pay

## 2018-06-25 ENCOUNTER — Encounter (HOSPITAL_COMMUNITY): Payer: Self-pay

## 2018-06-25 ENCOUNTER — Ambulatory Visit: Payer: Self-pay | Admitting: Orthopedic Surgery

## 2018-06-25 DIAGNOSIS — Z01818 Encounter for other preprocedural examination: Secondary | ICD-10-CM | POA: Diagnosis not present

## 2018-06-25 LAB — CBC
HCT: 43.2 % (ref 39.0–52.0)
Hemoglobin: 13.3 g/dL (ref 13.0–17.0)
MCH: 25.9 pg — ABNORMAL LOW (ref 26.0–34.0)
MCHC: 30.8 g/dL (ref 30.0–36.0)
MCV: 84.2 fL (ref 80.0–100.0)
Platelets: 234 10*3/uL (ref 150–400)
RBC: 5.13 MIL/uL (ref 4.22–5.81)
RDW: 14 % (ref 11.5–15.5)
WBC: 6.2 10*3/uL (ref 4.0–10.5)
nRBC: 0 % (ref 0.0–0.2)

## 2018-06-25 LAB — BASIC METABOLIC PANEL
Anion gap: 12 (ref 5–15)
BUN: 11 mg/dL (ref 8–23)
CO2: 23 mmol/L (ref 22–32)
Calcium: 9.2 mg/dL (ref 8.9–10.3)
Chloride: 105 mmol/L (ref 98–111)
Creatinine, Ser: 0.88 mg/dL (ref 0.61–1.24)
GFR calc Af Amer: >60 mL/min (ref >60)
GFR calc non Af Amer: >60 mL/min (ref >60)
Glucose, Bld: 111 mg/dL — ABNORMAL HIGH (ref 70–99)
Potassium: 3.9 mmol/L (ref 3.5–5.1)
Sodium: 140 mmol/L (ref 135–145)

## 2018-06-25 LAB — SURGICAL PCR SCREEN
MRSA, PCR: NEGATIVE
Staphylococcus aureus: NEGATIVE

## 2018-06-25 NOTE — Progress Notes (Signed)
05-10-18 (Epic) LOV w/Cardiology. F/u 8 months

## 2018-06-27 NOTE — Progress Notes (Signed)
Anesthesia Chart Review   Case: [id number] Date/Time: 07/03/18 1050   Procedure: TOTAL HIP ARTHROPLASTY ANTERIOR APPROACH (Left )   Anesthesia type: Spinal   Pre-op diagnosis: Degenerative joint disease left hip   Location: [facility] ROOM 08 / [facility]   Surgeon: Rod Can, MD      DISCUSSION:83 y.o. former smoker with h/o CAD (stent to RCA 2014), carotid artery stenosis (asymptomatic followed by vascular surgeon, last seen 01/08/2018), HTN, DJD left hip scheduled for above procedure 07/03/2018 with Dr. Rod Can.   Last seen by cardiology 05/10/2018.  Seen by Talbert Cage, NP.  Per OV note stable. Lexiscan 04/30/2018 negative for ischemia.  8 month follow up recommended.   Anticipate pt can proceed with planned procedure barring acute status change.   VS: BP (!) 187/76 (BP Location: Left Arm) Comment: Notified RN  Pulse 62   Temp 37.1 C (Oral)   Resp 18   Ht 5\' 7"  (1.702 m)   Wt 75.5 kg   SpO2 99%   BMI 26.06 kg/m   PROVIDERS: Ronita Hipps, MD is PCP   Beatris Ship, MD is Cardiologist  LABS: Labs reviewed: Acceptable for surgery. (all labs ordered are listed, but only abnormal results are displayed)  Labs Reviewed  BASIC METABOLIC PANEL - Abnormal; Notable for the following components:      Result Value   Glucose, Bld 111 (*)    All other components within normal limits  CBC - Abnormal; Notable for the following components:   MCH 25.9 (*)    All other components within normal limits  SURGICAL PCR SCREEN     IMAGES:   EKG: 06/25/2018 Rate 65 bpm Normal sinus rhythm   CV:  Past Medical History:  Diagnosis Date  . Arthritis   . Coronary artery disease   . Hypertension     Past Surgical History:  Procedure Laterality Date  . CARDIAC CATHETERIZATION      MEDICATIONS: . acetaminophen (TYLENOL) 325 MG tablet  . aspirin (BAYER LOW DOSE) 81 MG EC tablet  . benazepril (LOTENSIN) 20 MG tablet  . bimatoprost (LUMIGAN) 0.01 % SOLN  . celecoxib (CELEBREX)  200 MG capsule  . hydrALAZINE (APRESOLINE) 25 MG tablet  . hydrochlorothiazide (MICROZIDE) 12.5 MG capsule  . metoprolol tartrate (LOPRESSOR) 25 MG tablet  . omeprazole (PRILOSEC) 20 MG capsule  . polycarbophil (FIBERCON) 625 MG tablet  . rosuvastatin (CRESTOR) 20 MG tablet  . tamsulosin (FLOMAX) 0.4 MG CAPS capsule   No current facility-administered medications for this encounter.      Maia Plan [facility] Pre-Surgical Testing [phone] 06/27/18  4:07 PM

## 2018-06-27 NOTE — Anesthesia Preprocedure Evaluation (Addendum)
Anesthesia Evaluation  Patient identified by MRN, date of birth, ID band Patient awake    Reviewed: Allergy & Precautions, NPO status , Patient's Chart, lab work & pertinent test results  Airway Mallampati: II  TM Distance: >3 FB Neck ROM: Full    Dental no notable dental hx.    Pulmonary neg pulmonary ROS, former smoker,    Pulmonary exam normal breath sounds clear to auscultation       Cardiovascular hypertension, + CAD and + Cardiac Stents  Normal cardiovascular exam Rhythm:Regular Rate:Normal     Neuro/Psych negative neurological ROS  negative psych ROS   GI/Hepatic negative GI ROS, Neg liver ROS,   Endo/Other  negative endocrine ROS  Renal/GU negative Renal ROS  negative genitourinary   Musculoskeletal negative musculoskeletal ROS (+)   Abdominal   Peds negative pediatric ROS (+)  Hematology negative hematology ROS (+)   Anesthesia Other Findings   Reproductive/Obstetrics negative OB ROS                            Anesthesia Physical Anesthesia Plan  ASA: II  Anesthesia Plan: Spinal   Post-op Pain Management:    Induction:   PONV Risk Score and Plan: 1 and Treatment may vary due to age or medical condition  Airway Management Planned: Simple Face Mask  Additional Equipment:   Intra-op Plan:   Post-operative Plan:   Informed Consent: I have reviewed the patients History and Physical, chart, labs and discussed the procedure including the risks, benefits and alternatives for the proposed anesthesia with the patient or authorized representative who has indicated his/her understanding and acceptance.     Dental advisory given  Plan Discussed with:   Anesthesia Plan Comments: (See PAT note 06/25/2018, Konrad Felix, PA-C)       Anesthesia Quick Evaluation

## 2018-06-29 ENCOUNTER — Other Ambulatory Visit (HOSPITAL_COMMUNITY)
Admission: RE | Admit: 2018-06-29 | Discharge: 2018-06-29 | Disposition: A | Discharge status: Home / Self Care | Payer: PPO | Source: Ambulatory Visit | Attending: Orthopedic Surgery | Admitting: Orthopedic Surgery

## 2018-06-29 DIAGNOSIS — Z1159 Encounter for screening for other viral diseases: Secondary | ICD-10-CM | POA: Insufficient documentation

## 2018-06-29 LAB — SARS CORONAVIRUS 2 (TAT 6-24 HRS): SARS Coronavirus 2: NEGATIVE

## 2018-07-03 ENCOUNTER — Encounter (HOSPITAL_COMMUNITY): Payer: Self-pay

## 2018-07-03 ENCOUNTER — Inpatient Hospital Stay (HOSPITAL_COMMUNITY): Payer: PPO

## 2018-07-03 ENCOUNTER — Other Ambulatory Visit: Payer: Self-pay

## 2018-07-03 ENCOUNTER — Inpatient Hospital Stay (HOSPITAL_COMMUNITY)
Admission: RE | Admit: 2018-07-03 | Discharge: 2018-07-04 | DRG: 470 | Disposition: A | Discharge status: Home / Self Care | Payer: PPO | Attending: Orthopedic Surgery | Admitting: Orthopedic Surgery

## 2018-07-03 ENCOUNTER — Inpatient Hospital Stay (HOSPITAL_COMMUNITY): Payer: PPO | Admitting: Certified Registered Nurse Anesthetist

## 2018-07-03 ENCOUNTER — Encounter (HOSPITAL_COMMUNITY)
Admission: RE | Disposition: A | Discharge status: Home / Self Care | Payer: Self-pay | Source: Home / Self Care | Attending: Orthopedic Surgery

## 2018-07-03 ENCOUNTER — Inpatient Hospital Stay (HOSPITAL_COMMUNITY): Payer: PPO | Admitting: Physician Assistant

## 2018-07-03 DIAGNOSIS — I2510 Atherosclerotic heart disease of native coronary artery without angina pectoris: Secondary | ICD-10-CM | POA: Diagnosis present

## 2018-07-03 DIAGNOSIS — Z419 Encounter for procedure for purposes other than remedying health state, unspecified: Secondary | ICD-10-CM

## 2018-07-03 DIAGNOSIS — Z6826 Body mass index (BMI) 26.0-26.9, adult: Secondary | ICD-10-CM | POA: Diagnosis not present

## 2018-07-03 DIAGNOSIS — I10 Essential (primary) hypertension: Secondary | ICD-10-CM | POA: Diagnosis present

## 2018-07-03 DIAGNOSIS — E669 Obesity, unspecified: Secondary | ICD-10-CM | POA: Diagnosis present

## 2018-07-03 DIAGNOSIS — M1612 Unilateral primary osteoarthritis, left hip: Secondary | ICD-10-CM | POA: Diagnosis not present

## 2018-07-03 DIAGNOSIS — Z79899 Other long term (current) drug therapy: Secondary | ICD-10-CM | POA: Diagnosis not present

## 2018-07-03 DIAGNOSIS — Z7982 Long term (current) use of aspirin: Secondary | ICD-10-CM

## 2018-07-03 DIAGNOSIS — Z96642 Presence of left artificial hip joint: Secondary | ICD-10-CM | POA: Diagnosis not present

## 2018-07-03 DIAGNOSIS — Z471 Aftercare following joint replacement surgery: Secondary | ICD-10-CM | POA: Diagnosis not present

## 2018-07-03 DIAGNOSIS — Z87891 Personal history of nicotine dependence: Secondary | ICD-10-CM | POA: Diagnosis not present

## 2018-07-03 DIAGNOSIS — Z09 Encounter for follow-up examination after completed treatment for conditions other than malignant neoplasm: Secondary | ICD-10-CM

## 2018-07-03 DIAGNOSIS — M25752 Osteophyte, left hip: Secondary | ICD-10-CM | POA: Diagnosis present

## 2018-07-03 HISTORY — PX: TOTAL HIP ARTHROPLASTY: SHX124

## 2018-07-03 LAB — TYPE AND SCREEN
ABO/RH(D): A POS
Antibody Screen: NEGATIVE

## 2018-07-03 LAB — ABO/RH: ABO/RH(D): A POS

## 2018-07-03 SURGERY — ARTHROPLASTY, HIP, TOTAL, ANTERIOR APPROACH
Anesthesia: Spinal | Laterality: Left

## 2018-07-03 MED ORDER — ONDANSETRON HCL 4 MG/2ML IJ SOLN: 4.0000 mg | Freq: Four times a day (QID) | INTRAMUSCULAR | Status: DC | PRN

## 2018-07-03 MED ORDER — DEXAMETHASONE SODIUM PHOSPHATE 10 MG/ML IJ SOLN
INTRAMUSCULAR | Status: AC
  Filled 2018-07-03: qty 1

## 2018-07-03 MED ORDER — METOPROLOL TARTRATE 25 MG PO TABS
25.0000 mg | ORAL_TABLET | Freq: Two times a day (BID) | ORAL | Status: DC
  Administered 2018-07-03 – 2018-07-04 (×2): 25 mg via ORAL
  Filled 2018-07-03 (×2): qty 1

## 2018-07-03 MED ORDER — SENNA 8.6 MG PO TABS
1.0000 | ORAL_TABLET | Freq: Two times a day (BID) | ORAL | Status: DC
  Administered 2018-07-03 – 2018-07-04 (×2): 8.6 mg via ORAL
  Filled 2018-07-03 (×2): qty 1

## 2018-07-03 MED ORDER — HYDRALAZINE HCL 25 MG PO TABS
25.0000 mg | ORAL_TABLET | Freq: Three times a day (TID) | ORAL | Status: DC
  Administered 2018-07-03 – 2018-07-04 (×3): 25 mg via ORAL
  Filled 2018-07-03 (×3): qty 1

## 2018-07-03 MED ORDER — BUPIVACAINE-EPINEPHRINE 0.25% -1:200000 IJ SOLN
INTRAMUSCULAR | Status: DC | PRN
  Administered 2018-07-03: 30 mL

## 2018-07-03 MED ORDER — HYDROCODONE-ACETAMINOPHEN 7.5-325 MG PO TABS
1.0000 | ORAL_TABLET | ORAL | Status: DC | PRN
  Filled 2018-07-03: qty 2

## 2018-07-03 MED ORDER — ALBUMIN HUMAN 5 % IV SOLN
INTRAVENOUS | Status: DC | PRN
  Administered 2018-07-03: 13:00:00 via INTRAVENOUS

## 2018-07-03 MED ORDER — ALBUMIN HUMAN 5 % IV SOLN
INTRAVENOUS | Status: AC
  Filled 2018-07-03: qty 500

## 2018-07-03 MED ORDER — FENTANYL CITRATE (PF) 100 MCG/2ML IJ SOLN
INTRAMUSCULAR | Status: DC | PRN
  Administered 2018-07-03 (×2): 12.5 ug via INTRAVENOUS

## 2018-07-03 MED ORDER — ONDANSETRON HCL 4 MG PO TABS: 4.0000 mg | ORAL_TABLET | Freq: Four times a day (QID) | ORAL | Status: DC | PRN

## 2018-07-03 MED ORDER — EPHEDRINE 5 MG/ML INJ
INTRAVENOUS | Status: AC
  Filled 2018-07-03: qty 10

## 2018-07-03 MED ORDER — PROPOFOL 10 MG/ML IV BOLUS
INTRAVENOUS | Status: AC
  Filled 2018-07-03: qty 40

## 2018-07-03 MED ORDER — MORPHINE SULFATE (PF) 2 MG/ML IV SOLN: 0.5000 mg | INTRAVENOUS | Status: DC | PRN

## 2018-07-03 MED ORDER — CEFAZOLIN SODIUM-DEXTROSE 2-4 GM/100ML-% IV SOLN
2.0000 g | INTRAVENOUS | Status: AC
  Administered 2018-07-03: 11:00:00 2 g via INTRAVENOUS
  Filled 2018-07-03: qty 100

## 2018-07-03 MED ORDER — SODIUM CHLORIDE (PF) 0.9 % IJ SOLN
INTRAMUSCULAR | Status: AC
  Filled 2018-07-03: qty 50

## 2018-07-03 MED ORDER — HYDROCODONE-ACETAMINOPHEN 5-325 MG PO TABS: 1.0000 | ORAL_TABLET | ORAL | Status: DC | PRN

## 2018-07-03 MED ORDER — SODIUM CHLORIDE (PF) 0.9 % IJ SOLN
INTRAMUSCULAR | Status: DC | PRN
  Administered 2018-07-03: 30 mL

## 2018-07-03 MED ORDER — DOCUSATE SODIUM 100 MG PO CAPS
100.0000 mg | ORAL_CAPSULE | Freq: Two times a day (BID) | ORAL | Status: DC
  Administered 2018-07-03 – 2018-07-04 (×2): 100 mg via ORAL
  Filled 2018-07-03 (×2): qty 1

## 2018-07-03 MED ORDER — ACETAMINOPHEN 325 MG PO TABS: 325.0000 mg | ORAL_TABLET | Freq: Four times a day (QID) | ORAL | Status: DC | PRN

## 2018-07-03 MED ORDER — ISOPROPYL ALCOHOL 70 % SOLN
Status: DC | PRN
  Administered 2018-07-03: 1 via TOPICAL

## 2018-07-03 MED ORDER — PHENYLEPHRINE 40 MCG/ML (10ML) SYRINGE FOR IV PUSH (FOR BLOOD PRESSURE SUPPORT)
PREFILLED_SYRINGE | INTRAVENOUS | Status: DC | PRN
  Administered 2018-07-03 (×5): 80 ug via INTRAVENOUS

## 2018-07-03 MED ORDER — SODIUM CHLORIDE 0.9 % IR SOLN
Status: DC | PRN
  Administered 2018-07-03: 1000 mL

## 2018-07-03 MED ORDER — SODIUM CHLORIDE 0.9 % IV SOLN
INTRAVENOUS | Status: DC
  Administered 2018-07-03: 16:00:00 via INTRAVENOUS

## 2018-07-03 MED ORDER — ASPIRIN 81 MG PO CHEW
81.0000 mg | CHEWABLE_TABLET | Freq: Two times a day (BID) | ORAL | Status: DC
  Administered 2018-07-03 – 2018-07-04 (×2): 81 mg via ORAL
  Filled 2018-07-03 (×2): qty 1

## 2018-07-03 MED ORDER — CHLORHEXIDINE GLUCONATE 4 % EX LIQD: 60.0000 mL | Freq: Once | CUTANEOUS | Status: DC

## 2018-07-03 MED ORDER — SODIUM CHLORIDE 0.9 % IR SOLN
Status: DC | PRN
  Administered 2018-07-03: 1000 mL
  Administered 2018-07-03: 3000 mL

## 2018-07-03 MED ORDER — BENAZEPRIL HCL 20 MG PO TABS
40.0000 mg | ORAL_TABLET | Freq: Every day | ORAL | Status: DC
  Administered 2018-07-04: 40 mg via ORAL
  Filled 2018-07-03: qty 2

## 2018-07-03 MED ORDER — CEFAZOLIN SODIUM-DEXTROSE 2-4 GM/100ML-% IV SOLN
2.0000 g | Freq: Four times a day (QID) | INTRAVENOUS | Status: AC
  Administered 2018-07-03 (×2): 2 g via INTRAVENOUS
  Filled 2018-07-03 (×2): qty 100

## 2018-07-03 MED ORDER — MEPERIDINE HCL 50 MG/ML IJ SOLN: 6.2500 mg | INTRAMUSCULAR | Status: DC | PRN

## 2018-07-03 MED ORDER — WATER FOR IRRIGATION, STERILE IR SOLN
Status: DC | PRN
  Administered 2018-07-03: 3000 mL

## 2018-07-03 MED ORDER — BUPIVACAINE-EPINEPHRINE (PF) 0.25% -1:200000 IJ SOLN
INTRAMUSCULAR | Status: AC
  Filled 2018-07-03: qty 30

## 2018-07-03 MED ORDER — ONDANSETRON HCL 4 MG/2ML IJ SOLN
INTRAMUSCULAR | Status: AC
  Filled 2018-07-03: qty 2

## 2018-07-03 MED ORDER — PROPOFOL 10 MG/ML IV BOLUS
INTRAVENOUS | Status: AC
  Filled 2018-07-03: qty 20

## 2018-07-03 MED ORDER — DEXAMETHASONE SODIUM PHOSPHATE 10 MG/ML IJ SOLN
10.0000 mg | Freq: Once | INTRAMUSCULAR | Status: AC
  Administered 2018-07-04: 10 mg via INTRAVENOUS
  Filled 2018-07-03: qty 1

## 2018-07-03 MED ORDER — FENTANYL CITRATE (PF) 100 MCG/2ML IJ SOLN
INTRAMUSCULAR | Status: AC
  Filled 2018-07-03: qty 2

## 2018-07-03 MED ORDER — CALCIUM POLYCARBOPHIL 625 MG PO TABS
625.0000 mg | ORAL_TABLET | Freq: Every day | ORAL | Status: DC | PRN
  Filled 2018-07-03: qty 1

## 2018-07-03 MED ORDER — PHENYLEPHRINE 40 MCG/ML (10ML) SYRINGE FOR IV PUSH (FOR BLOOD PRESSURE SUPPORT)
PREFILLED_SYRINGE | INTRAVENOUS | Status: AC
  Filled 2018-07-03: qty 20

## 2018-07-03 MED ORDER — METOCLOPRAMIDE HCL 5 MG/ML IJ SOLN: 5.0000 mg | Freq: Three times a day (TID) | INTRAMUSCULAR | Status: DC | PRN

## 2018-07-03 MED ORDER — METOCLOPRAMIDE HCL 5 MG/ML IJ SOLN: 10.0000 mg | Freq: Once | INTRAMUSCULAR | Status: DC | PRN

## 2018-07-03 MED ORDER — POVIDONE-IODINE 10 % EX SWAB: 2.0000 "application " | Freq: Once | CUTANEOUS | Status: DC

## 2018-07-03 MED ORDER — SODIUM CHLORIDE 0.9 % IV SOLN
INTRAVENOUS | Status: DC | PRN
  Administered 2018-07-03: 11:00:00 80 ug/min via INTRAVENOUS

## 2018-07-03 MED ORDER — POVIDONE-IODINE 10 % EX SWAB
2.0000 "application " | Freq: Once | CUTANEOUS | Status: AC
  Administered 2018-07-03: 2 via TOPICAL

## 2018-07-03 MED ORDER — METHOCARBAMOL 500 MG IVPB - SIMPLE MED
500.0000 mg | Freq: Four times a day (QID) | INTRAVENOUS | Status: DC | PRN
  Filled 2018-07-03: qty 50

## 2018-07-03 MED ORDER — BUPIVACAINE IN DEXTROSE 0.75-8.25 % IT SOLN
INTRATHECAL | Status: DC | PRN
  Administered 2018-07-03: 2 mL via INTRATHECAL

## 2018-07-03 MED ORDER — ISOPROPYL ALCOHOL 70 % SOLN
Status: AC
  Filled 2018-07-03: qty 480

## 2018-07-03 MED ORDER — METOCLOPRAMIDE HCL 5 MG PO TABS: 5.0000 mg | ORAL_TABLET | Freq: Three times a day (TID) | ORAL | Status: DC | PRN

## 2018-07-03 MED ORDER — SODIUM CHLORIDE 0.9 % IV SOLN
INTRAVENOUS | Status: DC
  Administered 2018-07-03: 09:00:00 via INTRAVENOUS

## 2018-07-03 MED ORDER — TRANEXAMIC ACID-NACL 1000-0.7 MG/100ML-% IV SOLN
1000.0000 mg | INTRAVENOUS | Status: AC
  Administered 2018-07-03: 1000 mg via INTRAVENOUS
  Filled 2018-07-03: qty 100

## 2018-07-03 MED ORDER — POLYETHYLENE GLYCOL 3350 17 G PO PACK: 17.0000 g | PACK | Freq: Every day | ORAL | Status: DC | PRN

## 2018-07-03 MED ORDER — PHENOL 1.4 % MT LIQD: 1.0000 | OROMUCOSAL | Status: DC | PRN

## 2018-07-03 MED ORDER — ACETAMINOPHEN 10 MG/ML IV SOLN
1000.0000 mg | INTRAVENOUS | Status: AC
  Administered 2018-07-03: 13:00:00 1000 mg via INTRAVENOUS
  Filled 2018-07-03: qty 100

## 2018-07-03 MED ORDER — MENTHOL 3 MG MT LOZG: 1.0000 | LOZENGE | OROMUCOSAL | Status: DC | PRN

## 2018-07-03 MED ORDER — DIPHENHYDRAMINE HCL 12.5 MG/5ML PO ELIX: 12.5000 mg | ORAL_SOLUTION | ORAL | Status: DC | PRN

## 2018-07-03 MED ORDER — ROSUVASTATIN CALCIUM 20 MG PO TABS
20.0000 mg | ORAL_TABLET | Freq: Every day | ORAL | Status: DC
  Administered 2018-07-03: 20 mg via ORAL
  Filled 2018-07-03: qty 1

## 2018-07-03 MED ORDER — ALUM & MAG HYDROXIDE-SIMETH 200-200-20 MG/5ML PO SUSP: 30.0000 mL | ORAL | Status: DC | PRN

## 2018-07-03 MED ORDER — HYDROCHLOROTHIAZIDE 12.5 MG PO CAPS
12.5000 mg | ORAL_CAPSULE | Freq: Every day | ORAL | Status: DC
  Administered 2018-07-03 – 2018-07-04 (×2): 12.5 mg via ORAL
  Filled 2018-07-03 (×2): qty 1

## 2018-07-03 MED ORDER — LATANOPROST 0.005 % OP SOLN
1.0000 [drp] | Freq: Every day | OPHTHALMIC | Status: DC
  Filled 2018-07-03: qty 2.5

## 2018-07-03 MED ORDER — KETOROLAC TROMETHAMINE 30 MG/ML IJ SOLN
INTRAMUSCULAR | Status: AC
  Filled 2018-07-03: qty 1

## 2018-07-03 MED ORDER — FENTANYL CITRATE (PF) 100 MCG/2ML IJ SOLN: 25.0000 ug | INTRAMUSCULAR | Status: DC | PRN

## 2018-07-03 MED ORDER — EPHEDRINE SULFATE-NACL 50-0.9 MG/10ML-% IV SOSY
PREFILLED_SYRINGE | INTRAVENOUS | Status: DC | PRN
  Administered 2018-07-03 (×5): 10 mg via INTRAVENOUS

## 2018-07-03 MED ORDER — PROPOFOL 500 MG/50ML IV EMUL
INTRAVENOUS | Status: DC | PRN
  Administered 2018-07-03: 40 ug/kg/min via INTRAVENOUS

## 2018-07-03 MED ORDER — ONDANSETRON HCL 4 MG/2ML IJ SOLN
INTRAMUSCULAR | Status: DC | PRN
  Administered 2018-07-03: 4 mg via INTRAVENOUS

## 2018-07-03 MED ORDER — KETOROLAC TROMETHAMINE 30 MG/ML IJ SOLN
INTRAMUSCULAR | Status: DC | PRN
  Administered 2018-07-03: 30 mg

## 2018-07-03 MED ORDER — METHOCARBAMOL 500 MG PO TABS
500.0000 mg | ORAL_TABLET | Freq: Four times a day (QID) | ORAL | Status: DC | PRN
  Filled 2018-07-03: qty 1

## 2018-07-03 MED ORDER — LACTATED RINGERS IV SOLN
INTRAVENOUS | Status: DC | PRN
  Administered 2018-07-03 (×2): via INTRAVENOUS

## 2018-07-03 MED ORDER — PANTOPRAZOLE SODIUM 40 MG PO TBEC
40.0000 mg | DELAYED_RELEASE_TABLET | Freq: Every day | ORAL | Status: DC
  Administered 2018-07-03 – 2018-07-04 (×2): 40 mg via ORAL
  Filled 2018-07-03 (×2): qty 1

## 2018-07-03 MED ORDER — DEXAMETHASONE SODIUM PHOSPHATE 10 MG/ML IJ SOLN
INTRAMUSCULAR | Status: DC | PRN
  Administered 2018-07-03: 10 mg via INTRAVENOUS

## 2018-07-03 MED ORDER — TAMSULOSIN HCL 0.4 MG PO CAPS
0.4000 mg | ORAL_CAPSULE | Freq: Two times a day (BID) | ORAL | Status: DC
  Administered 2018-07-03 – 2018-07-04 (×2): 0.4 mg via ORAL
  Filled 2018-07-03 (×2): qty 1

## 2018-07-03 MED ORDER — CELECOXIB 200 MG PO CAPS
200.0000 mg | ORAL_CAPSULE | Freq: Two times a day (BID) | ORAL | Status: DC
  Administered 2018-07-03 – 2018-07-04 (×2): 200 mg via ORAL
  Filled 2018-07-03 (×2): qty 1

## 2018-07-03 SURGICAL SUPPLY — 67 items
ACETAB CUP W GRIPTION 54MM (Plate) ×1 IMPLANT
ACETAB CUP W/GRIPTION 54 (Plate) ×2 IMPLANT
ADH SKN CLS APL DERMABOND .7 (GAUZE/BANDAGES/DRESSINGS) ×2
APL PRP STRL LF DISP 70% ISPRP (MISCELLANEOUS) ×1
BAG DECANTER FOR FLEXI CONT (MISCELLANEOUS) ×4 IMPLANT
BAG SPEC THK2 15X12 ZIP CLS (MISCELLANEOUS)
BAG ZIPLOCK 12X15 (MISCELLANEOUS) IMPLANT
BLADE SURG SZ10 CARB STEEL (BLADE) ×6 IMPLANT
CHLORAPREP W/TINT 26 (MISCELLANEOUS) ×3 IMPLANT
CLOTH BEACON ORANGE TIMEOUT ST (SAFETY) ×3 IMPLANT
COVER PERINEAL POST (MISCELLANEOUS) ×3 IMPLANT
COVER SURGICAL LIGHT HANDLE (MISCELLANEOUS) ×3 IMPLANT
COVER WAND RF STERILE (DRAPES) IMPLANT
CUP ACETAB W/GRIPTION 54 (Plate) IMPLANT
DECANTER SPIKE VIAL GLASS SM (MISCELLANEOUS) ×3 IMPLANT
DERMABOND ADVANCED (GAUZE/BANDAGES/DRESSINGS) ×4
DERMABOND ADVANCED .7 DNX12 (GAUZE/BANDAGES/DRESSINGS) ×2 IMPLANT
DRAPE IMP U-DRAPE 54X76 (DRAPES) ×3 IMPLANT
DRAPE SHEET LG 3/4 BI-LAMINATE (DRAPES) ×9 IMPLANT
DRAPE STERI IOBAN 125X83 (DRAPES) ×3 IMPLANT
DRAPE U-SHAPE 47X51 STRL (DRAPES) ×6 IMPLANT
DRESSING AQUACEL AG SP 3.5X10 (GAUZE/BANDAGES/DRESSINGS) IMPLANT
DRSG AQUACEL AG ADV 3.5X10 (GAUZE/BANDAGES/DRESSINGS) ×3 IMPLANT
DRSG AQUACEL AG SP 3.5X10 (GAUZE/BANDAGES/DRESSINGS) ×3
ELECT BLADE TIP CTD 4 INCH (ELECTRODE) ×3 IMPLANT
ELECT PENCIL ROCKER SW 15FT (MISCELLANEOUS) ×3 IMPLANT
ELECT REM PT RETURN 15FT ADLT (MISCELLANEOUS) ×3 IMPLANT
GAUZE SPONGE 4X4 12PLY STRL (GAUZE/BANDAGES/DRESSINGS) ×3 IMPLANT
GLOVE BIO SURGEON STRL SZ8.5 (GLOVE) ×6 IMPLANT
GLOVE BIOGEL PI IND STRL 8.5 (GLOVE) ×1 IMPLANT
GLOVE BIOGEL PI INDICATOR 8.5 (GLOVE) ×2
GOWN SPEC L3 XXLG W/TWL (GOWN DISPOSABLE) ×3 IMPLANT
HANDPIECE INTERPULSE COAX TIP (DISPOSABLE) ×3
HEAD M SROM 36MM PLUS 1.5 (Hips) IMPLANT
HOLDER FOLEY CATH W/STRAP (MISCELLANEOUS) ×3 IMPLANT
HOOD PEEL AWAY FLYTE STAYCOOL (MISCELLANEOUS) ×12 IMPLANT
JET LAVAGE IRRISEPT WOUND (IRRIGATION / IRRIGATOR) ×3
KIT TURNOVER KIT A (KITS) IMPLANT
LAVAGE JET IRRISEPT WOUND (IRRIGATION / IRRIGATOR) ×1 IMPLANT
LINER NEUTRAL 36ID 54OD (Liner) ×2 IMPLANT
MANIFOLD NEPTUNE II (INSTRUMENTS) ×3 IMPLANT
MARKER SKIN DUAL TIP RULER LAB (MISCELLANEOUS) ×3 IMPLANT
NDL SAFETY ECLIPSE 18X1.5 (NEEDLE) ×1 IMPLANT
NDL SPNL 18GX3.5 QUINCKE PK (NEEDLE) ×1 IMPLANT
NEEDLE HYPO 18GX1.5 SHARP (NEEDLE) ×3
NEEDLE SPNL 18GX3.5 QUINCKE PK (NEEDLE) ×3 IMPLANT
PACK ANTERIOR HIP CUSTOM (KITS) ×3 IMPLANT
SAW OSC TIP CART 19.5X105X1.3 (SAW) ×3 IMPLANT
SEALER BIPOLAR AQUA 6.0 (INSTRUMENTS) ×3 IMPLANT
SET HNDPC FAN SPRY TIP SCT (DISPOSABLE) ×1 IMPLANT
SROM M HEAD 36MM PLUS 1.5 (Hips) ×3 IMPLANT
STEM TRI LOC BPS SZ3 W GRIPTON IMPLANT
SUT ETHIBOND NAB CT1 #1 30IN (SUTURE) ×6 IMPLANT
SUT MNCRL AB 3-0 PS2 18 (SUTURE) ×3 IMPLANT
SUT MNCRL AB 4-0 PS2 18 (SUTURE) ×3 IMPLANT
SUT MON AB 2-0 CT1 36 (SUTURE) ×6 IMPLANT
SUT STRATAFIX PDO 1 14 VIOLET (SUTURE) ×3
SUT STRATFX PDO 1 14 VIOLET (SUTURE) ×1
SUT VIC AB 2-0 CT1 27 (SUTURE) ×3
SUT VIC AB 2-0 CT1 TAPERPNT 27 (SUTURE) ×1 IMPLANT
SUTURE STRATFX PDO 1 14 VIOLET (SUTURE) ×1 IMPLANT
SYR 3ML LL SCALE MARK (SYRINGE) ×4 IMPLANT
SYR 50ML LL SCALE MARK (SYRINGE) ×3 IMPLANT
TRAY FOLEY MTR SLVR 16FR STAT (SET/KITS/TRAYS/PACK) IMPLANT
TRI LOC BPS SZ 3 W GRIPTON ×3 IMPLANT
WATER STERILE IRR 1000ML POUR (IV SOLUTION) ×3 IMPLANT
YANKAUER SUCT BULB TIP 10FT TU (MISCELLANEOUS) ×3 IMPLANT

## 2018-07-03 NOTE — Anesthesia Procedure Notes (Signed)
Procedure Name: MAC Date/Time: 07/03/2018 11:01 AM Performed by: West Pugh, CRNA Pre-anesthesia Checklist: Patient identified, Emergency Drugs available, Suction available, Patient being monitored and Timeout performed Patient Re-evaluated:Patient Re-evaluated prior to induction Oxygen Delivery Method: Simple face mask Preoxygenation: Pre-oxygenation with 100% oxygen Induction Type: IV induction Placement Confirmation: positive ETCO2 Dental Injury: Teeth and Oropharynx as per pre-operative assessment

## 2018-07-03 NOTE — Transfer of Care (Signed)
Immediate Anesthesia Transfer of Care Note  Patient: Joel Peck  Procedure(s) Performed: TOTAL HIP ARTHROPLASTY ANTERIOR APPROACH (Left )  Patient Location: PACU  Anesthesia Type:MAC and Spinal  Level of Consciousness: awake, alert , oriented and patient cooperative  Airway & Oxygen Therapy: Patient Spontanous Breathing and Patient connected to face mask oxygen  Post-op Assessment: Report given to RN and Post -op Vital signs reviewed and stable  Post vital signs: Reviewed and stable  Last Vitals:  Vitals Value Taken Time  BP    Temp    Pulse    Resp    SpO2      Last Pain:  Vitals:   07/03/18 0930  TempSrc:   PainSc: 0-No pain         Complications: No apparent anesthesia complications

## 2018-07-03 NOTE — Op Note (Signed)
OPERATIVE REPORT  SURGEON: Rod Can, MD   ASSISTANT: Nehemiah Massed, PA-C.  PREOPERATIVE DIAGNOSIS: Left hip arthritis.   POSTOPERATIVE DIAGNOSIS: Left hip arthritis.   PROCEDURE: Left total hip arthroplasty, anterior approach.   IMPLANTS: DePuy Tri Lock stem, size 3, hi offset. DePuy Pinnacle Cup, size 54 mm. DePuy Altrx liner, size 36 by 54 mm, neutral. DePuy metal head ball, size 36 + 1.5 mm.  ANESTHESIA:  Spinal  ESTIMATED BLOOD LOSS:-250 mL    ANTIBIOTICS: 2 g Ancef.  DRAINS: None.  COMPLICATIONS: None.   CONDITION: PACU - hemodynamically stable.   BRIEF CLINICAL NOTE: Joel Peck is a 83 y.o. male with a long-standing history of Left hip arthritis. After failing conservative management, the patient was indicated for total hip arthroplasty. The risks, benefits, and alternatives to the procedure were explained, and the patient elected to proceed.  PROCEDURE IN DETAIL: Surgical site was marked by myself in the pre-op holding area. Once inside the operating room, spinal anesthesia was obtained, and a foley catheter was inserted. The patient was then positioned on the Hana table. All bony prominences were well padded. The hip was prepped and draped in the normal sterile surgical fashion. A time-out was called verifying side and site of surgery. The patient received IV antibiotics within 60 minutes of beginning the procedure.  The direct anterior approach to the hip was performed through the Hueter interval. Lateral femoral circumflex vessels were treated with the Auqumantys. The anterior capsule was exposed and an inverted T capsulotomy was made.The femoral neck cut was made to the level of the templated cut. A corkscrew was placed into the head and the head was removed. The femoral head was found to have eburnated bone. The head was passed to the back table and was measured.  Acetabular exposure was achieved, and the pulvinar and labrum were excised.  Sequential reaming of the acetabulum was then performed up to a size 53 mm reamer. A 54 mm cup was then opened and impacted into place at approximately 40 degrees of abduction and 20 degrees of anteversion. The final polyethylene liner was impacted into place and acetabular osteophytes were removed.   I then gained femoral exposure taking care to protect the abductors and greater trochanter. This was performed using standard external rotation, extension, and adduction. The capsule was peeled off the inner aspect of the greater trochanter, taking care to preserve the short external rotators. A cookie cutter was used to enter the femoral canal, and then the femoral canal finder was placed. Sequential broaching was performed up to a size 3. Calcar planer was used on the femoral neck remnant. I placed a hi offset neck and a trial head ball. The hip was reduced. Leg lengths and offset were checked fluoroscopically. The hip was dislocated and trial components were removed. The final implants were placed, and the hip was reduced.  Fluoroscopy was used to confirm component position and leg lengths. At 90 degrees of external rotation and full extension, the hip was stable to an anterior directed force.  The wound was copiously irrigated with Irrisept solution and normal saline using pulse lavage. Marcaine solution was injected into the periarticular soft tissue. The wound was closed in layers using #1 Vicryl and V-Loc for the fascia, 2-0 Vicryl for the subcutaneous fat, 2-0 Monocryl for the deep dermal layer, 3-0 running Monocryl subcuticular stitch, and Dermabond for the skin. Once the glue was fully dried, an Aquacell Ag dressing was applied. The patient was transported to the  recovery room in stable condition. Sponge, needle, and instrument counts were correct at the end of the case x2. The patient tolerated the procedure well and there were no known complications.  Please note that a surgical  assistant was a medical necessity for this procedure to perform it in a safe and expeditious manner. Assistant was necessary to provide appropriate retraction of vital neurovascular structures, to prevent femoral fracture, and to allow for anatomic placement of the prosthesis.

## 2018-07-03 NOTE — Discharge Instructions (Signed)
°Dr. Lissa Rowles °Joint Replacement Specialist °[facility] °[address] °[place], [place] [address] °[phone] ° ° °TOTAL HIP REPLACEMENT POSTOPERATIVE DIRECTIONS ° ° ° °Hip Rehabilitation, Guidelines Following Surgery  ° °WEIGHT BEARING °Weight bearing as tolerated with assist device (walker, cane, etc) as directed, use it as long as suggested by your surgeon or therapist, typically at least 4-6 weeks. ° °The results of a hip operation are greatly improved after range of motion and muscle strengthening exercises. Follow all safety measures which are given to protect your hip. If any of these exercises cause increased pain or swelling in your joint, decrease the amount until you are comfortable again. Then slowly increase the exercises. Call your caregiver if you have problems or questions.  ° °HOME CARE INSTRUCTIONS  °Most of the following instructions are designed to prevent the dislocation of your new hip.  °Remove items at home which could result in a fall. This includes throw rugs or furniture in walking pathways.  °Continue medications as instructed at time of discharge. °· You may have some home medications which will be placed on hold until you complete the course of blood thinner medication. °· You may start showering once you are discharged home. Do not remove your dressing. °Do not put on socks or shoes without following the instructions of your caregivers.   °Sit on chairs with arms. Use the chair arms to help push yourself up when arising.  °Arrange for the use of a toilet seat elevator so you are not sitting low.  °· Walk with walker as instructed.  °You may resume a sexual relationship in one month or when given the OK by your caregiver.  °Use walker as long as suggested by your caregivers.  °You may put full weight on your legs and walk as much as is comfortable. °Avoid periods of inactivity such as sitting longer than an hour when not asleep. This helps prevent  blood clots.  °You may return to work once you are cleared by your surgeon.  °Do not drive a car for 6 weeks or until released by your surgeon.  °Do not drive while taking narcotics.  °Wear elastic stockings for two weeks following surgery during the day but you may remove then at night.  °Make sure you keep all of your appointments after your operation with all of your doctors and caregivers. You should call the office at the above phone number and make an appointment for approximately two weeks after the date of your surgery. °Please pick up a stool softener and laxative for home use as long as you are requiring pain medications. °· ICE to the affected hip every three hours for 30 minutes at a time and then as needed for pain and swelling. Continue to use ice on the hip for pain and swelling from surgery. You may notice swelling that will progress down to the foot and ankle.  This is normal after surgery.  Elevate the leg when you are not up walking on it.   °It is important for you to complete the blood thinner medication as prescribed by your doctor. °· Continue to use the breathing machine which will help keep your temperature down.  It is common for your temperature to cycle up and down following surgery, especially at night when you are not up moving around and exerting yourself.  The breathing machine keeps your lungs expanded and your temperature down. ° °RANGE OF MOTION AND STRENGTHENING EXERCISES  °These exercises are   designed to help you keep full movement of your hip joint. Follow your caregiver's or physical therapist's instructions. Perform all exercises about fifteen times, three times per day or as directed. Exercise both hips, even if you have had only one joint replacement. These exercises can be done on a training (exercise) mat, on the floor, on a table or on a bed. Use whatever works the best and is most comfortable for you. Use music or television while you are exercising so that the exercises  are a pleasant break in your day. This will make your life better with the exercises acting as a break in routine you can look forward to.  °Lying on your back, slowly slide your foot toward your buttocks, raising your knee up off the floor. Then slowly slide your foot back down until your leg is straight again.  °Lying on your back spread your legs as far apart as you can without causing discomfort.  °Lying on your side, raise your upper leg and foot straight up from the floor as far as is comfortable. Slowly lower the leg and repeat.  °Lying on your back, tighten up the muscle in the front of your thigh (quadriceps muscles). You can do this by keeping your leg straight and trying to raise your heel off the floor. This helps strengthen the largest muscle supporting your knee.  °Lying on your back, tighten up the muscles of your buttocks both with the legs straight and with the knee bent at a comfortable angle while keeping your heel on the floor.  ° °SKILLED REHAB INSTRUCTIONS: °If the patient is transferred to a skilled rehab facility following release from the hospital, a list of the current medications will be sent to the facility for the patient to continue.  When discharged from the skilled rehab facility, please have the facility set up the patient's Home Health Physical Therapy prior to being released. Also, the skilled facility will be responsible for providing the patient with their medications at time of release from the facility to include their pain medication and their blood thinner medication. If the patient is still at the rehab facility at time of the two week follow up appointment, the skilled rehab facility will also need to assist the patient in arranging follow up appointment in our office and any transportation needs. ° °MAKE SURE YOU:  °Understand these instructions.  °Will watch your condition.  °Will get help right away if you are not doing well or get worse. ° °Pick up stool softner and  laxative for home use following surgery while on pain medications. °Do not remove your dressing. °The dressing is waterproof--it is OK to take showers. °Continue to use ice for pain and swelling after surgery. °Do not use any lotions or creams on the incision until instructed by your surgeon. °Total Hip Protocol. ° ° °

## 2018-07-03 NOTE — Evaluation (Signed)
Physical Therapy Evaluation Patient Details Name: Joel Peck MRN: [id number] DOB: 06-Jun-1935 Today's Date: 07/03/2018   History of Present Illness  83 yo male s/p L DA-THA on 07/03/18. PMH includes OA, CAD, HTN.  Clinical Impression  Pt presents with very mild L hip pain, post-operative hip weakness, increased time and effort to perform mobility tasks, and decreased activity tolerance. Pt to benefit from acute PT to address deficits. Pt ambulated 90 ft with RW with min guard assist, verbal cuing for form and safety provided. Pt educated on ankle pumps (20/hour) to perform this afternoon/evening to increase circulation, to pt's tolerance and limited by pain. PT to progress mobility as tolerated, and will continue to follow acutely.        Follow Up Recommendations Follow surgeon's recommendation for DC plan and follow-up therapies;Supervision for mobility/OOB(HEP)    Equipment Recommendations  None recommended by PT    Recommendations for Other Services       Precautions / Restrictions Precautions Precautions: Fall Restrictions Weight Bearing Restrictions: No LLE Weight Bearing: Weight bearing as tolerated      Mobility  Bed Mobility Overal bed mobility: Needs Assistance Bed Mobility: Supine to Sit     Supine to sit: Min guard;HOB elevated     General bed mobility comments: Min guard for safety, verbal cuing for sequencing. Increased time and effort.  Transfers Overall transfer level: Needs assistance Equipment used: Rolling walker (2 wheeled) Transfers: Sit to/from Stand Sit to Stand: Min guard;From elevated surface         General transfer comment: Min guard for safety. Verbal cuing for hand placement when rising.  Ambulation/Gait Ambulation/Gait assistance: Min guard Gait Distance (Feet): 90 Feet Assistive device: Rolling walker (2 wheeled) Gait Pattern/deviations: Step-through pattern;Step-to pattern;Decreased stride length;Trunk flexed Gait velocity:  decr   General Gait Details: Min guard for safety. Verbal cuing for upright posture, sequencing although pt quickly progressing to step-through gait, and placement in RW.  Stairs            Wheelchair Mobility    Modified Rankin (Stroke Patients Only)       Balance Overall balance assessment: Mild deficits observed, not formally tested                                           Pertinent Vitals/Pain Pain Assessment: 0-10 Pain Score: 1  Pain Location: L hip Pain Descriptors / Indicators: Sore Pain Intervention(s): Limited activity within patient's tolerance;Monitored during session;Premedicated before session;Repositioned    Home Living Family/patient expects to be discharged to:: Private residence Living Arrangements: Spouse/significant other;Children Available Help at Discharge: Family Type of Home: House Home Access: Stairs to enter   Technical brewer of Steps: 1 Home Layout: One level(with one step into den) Home Equipment: Environmental consultant - 2 wheels;Cane - single point;Bedside commode;Hand held shower head      Prior Function Level of Independence: Independent         Comments: Pt reports being active with golf, gardening PTA. Has a very supportive and loving family from pt report.     Hand Dominance   Dominant Hand: Right    Extremity/Trunk Assessment   Upper Extremity Assessment Upper Extremity Assessment: Overall WFL for tasks assessed    Lower Extremity Assessment Lower Extremity Assessment: Overall WFL for tasks assessed;LLE deficits/detail LLE Deficits / Details: suspected post-operative hip weakness; able to perform ankle pumps, quad set, heel  slide, SLR with bed mobility without lift assist LLE Sensation: WNL    Cervical / Trunk Assessment Cervical / Trunk Assessment: Normal  Communication   Communication: No difficulties  Cognition Arousal/Alertness: Awake/alert Behavior During Therapy: WFL for tasks  assessed/performed Overall Cognitive Status: Within Functional Limits for tasks assessed                                        General Comments      Exercises     Assessment/Plan    PT Assessment Patient needs continued PT services  PT Problem List Decreased strength;Decreased mobility;Decreased range of motion;Decreased activity tolerance;Decreased balance;Decreased knowledge of use of DME;Pain       PT Treatment Interventions DME instruction;Therapeutic activities;Gait training;Therapeutic exercise;Patient/family education;Balance training;Stair training;Functional mobility training    PT Goals (Current goals can be found in the Care Plan section)  Acute Rehab PT Goals Patient Stated Goal: return to PLOF PT Goal Formulation: With patient Time For Goal Achievement: 07/10/18 Potential to Achieve Goals: Good    Frequency 7X/week   Barriers to discharge        Co-evaluation               AM-PAC PT "6 Clicks" Mobility  Outcome Measure Help needed turning from your back to your side while in a flat bed without using bedrails?: A Little Help needed moving from lying on your back to sitting on the side of a flat bed without using bedrails?: A Little Help needed moving to and from a bed to a chair (including a wheelchair)?: A Little Help needed standing up from a chair using your arms (e.g., wheelchair or bedside chair)?: A Little Help needed to walk in hospital room?: A Little Help needed climbing 3-5 steps with a railing? : A Little 6 Click Score: 18    End of Session Equipment Utilized During Treatment: Gait belt Activity Tolerance: Patient tolerated treatment well Patient left: in chair;with chair alarm set;with call bell/phone within reach;with SCD's reapplied Nurse Communication: Mobility status PT Visit Diagnosis: Other abnormalities of gait and mobility (R26.89);Difficulty in walking, not elsewhere classified (R26.2)    Time: 6301-6010 PT  Time Calculation (min) (ACUTE ONLY): 18 min   Charges:   PT Evaluation $PT Eval Low Complexity: 1 Low          Shatora Weatherbee Conception Chancy, PT Acute Rehabilitation Services Pager [phone]  Office [phone]   Xiao Graul D Elonda Husky 07/03/2018, 6:40 PM

## 2018-07-03 NOTE — Anesthesia Postprocedure Evaluation (Signed)
Anesthesia Post Note  Patient: Joel Peck  Procedure(s) Performed: TOTAL HIP ARTHROPLASTY ANTERIOR APPROACH (Left )     Patient location during evaluation: PACU Anesthesia Type: Spinal Level of consciousness: awake and alert Pain management: pain level controlled Vital Signs Assessment: post-procedure vital signs reviewed and stable Respiratory status: spontaneous breathing and respiratory function stable Cardiovascular status: blood pressure returned to baseline and stable Postop Assessment: no headache, no backache, spinal receding and no apparent nausea or vomiting Anesthetic complications: no    Last Vitals:  Vitals:   07/03/18 1420 07/03/18 1517  BP: (!) 148/73 (!) 147/71  Pulse: 78 80  Resp: 16 16  Temp:  (!) 36.3 C  SpO2: 98% 100%    Last Pain:  Vitals:   07/03/18 1517  TempSrc: Oral  PainSc:                  Montez Hageman

## 2018-07-03 NOTE — Interval H&P Note (Signed)
History and Physical Interval Note:  07/03/2018 10:34 AM  Joel Peck  has presented today for surgery, with the diagnosis of Degenerative joint disease left hip.  The various methods of treatment have been discussed with the patient and family. After consideration of risks, benefits and other options for treatment, the patient has consented to  Procedure(s): TOTAL HIP ARTHROPLASTY ANTERIOR APPROACH (Left) as a surgical intervention.  The patient's history has been reviewed, patient examined, no change in status, stable for surgery.  I have reviewed the patient's chart and labs.  Questions were answered to the patient's satisfaction.     Joel Peck

## 2018-07-03 NOTE — Plan of Care (Signed)

## 2018-07-03 NOTE — Anesthesia Procedure Notes (Signed)
Spinal  Patient location during procedure: OR Start time: 07/03/2018 11:00 AM End time: 07/03/2018 11:14 AM Reason for block: at surgeon's request Staffing Anesthesiologist: Montez Hageman, MD Resident/CRNA: West Pugh, CRNA Performed: anesthesiologist  Preanesthetic Checklist Completed: patient identified, site marked, surgical consent, pre-op evaluation, timeout performed, IV checked, risks and benefits discussed and monitors and equipment checked Spinal Block Patient position: sitting Prep: DuraPrep Patient monitoring: heart rate, continuous pulse ox and blood pressure Approach: right paramedian Location: L3-4 Injection technique: single-shot Needle Needle type: Quincke  Needle gauge: 22 G Needle length: 9 cm Assessment Sensory level: T4 Additional Notes Expiration of kit checked and confirmed. Patient tolerated procedure well,without complications with noted clear CSF. Loss of motor and sensory on exam post injection. CRNA attempted without success. Dr Marcell Barlow attempted and successful with Quincke 22g

## 2018-07-04 ENCOUNTER — Encounter (HOSPITAL_COMMUNITY): Payer: Self-pay | Admitting: Orthopedic Surgery

## 2018-07-04 LAB — BASIC METABOLIC PANEL
Anion gap: 8 (ref 5–15)
BUN: 16 mg/dL (ref 8–23)
CO2: 23 mmol/L (ref 22–32)
Calcium: 8.3 mg/dL — ABNORMAL LOW (ref 8.9–10.3)
Chloride: 108 mmol/L (ref 98–111)
Creatinine, Ser: 0.93 mg/dL (ref 0.61–1.24)
GFR calc Af Amer: >60 mL/min (ref >60)
GFR calc non Af Amer: >60 mL/min (ref >60)
Glucose, Bld: 126 mg/dL — ABNORMAL HIGH (ref 70–99)
Potassium: 3.9 mmol/L (ref 3.5–5.1)
Sodium: 139 mmol/L (ref 135–145)

## 2018-07-04 LAB — CBC
HCT: 31.2 % — ABNORMAL LOW (ref 39.0–52.0)
Hemoglobin: 10.1 g/dL — ABNORMAL LOW (ref 13.0–17.0)
MCH: 26.9 pg (ref 26.0–34.0)
MCHC: 32.4 g/dL (ref 30.0–36.0)
MCV: 83.2 fL (ref 80.0–100.0)
Platelets: 195 10*3/uL (ref 150–400)
RBC: 3.75 MIL/uL — ABNORMAL LOW (ref 4.22–5.81)
RDW: 14.1 % (ref 11.5–15.5)
WBC: 12.5 10*3/uL — ABNORMAL HIGH (ref 4.0–10.5)
nRBC: 0 % (ref 0.0–0.2)

## 2018-07-04 MED ORDER — ASPIRIN 81 MG PO CHEW: 81.0000 mg | CHEWABLE_TABLET | Freq: Two times a day (BID) | ORAL | 1 refills | Status: DC

## 2018-07-04 MED ORDER — DOCUSATE SODIUM 100 MG PO CAPS: 100.0000 mg | ORAL_CAPSULE | Freq: Two times a day (BID) | ORAL | 1 refills | Status: DC

## 2018-07-04 MED ORDER — SENNA 8.6 MG PO TABS: 1.0000 | ORAL_TABLET | Freq: Two times a day (BID) | ORAL | 0 refills | Status: DC

## 2018-07-04 MED ORDER — HYDROCODONE-ACETAMINOPHEN 5-325 MG PO TABS: 1.0000 | ORAL_TABLET | ORAL | 0 refills | Status: DC | PRN

## 2018-07-04 MED ORDER — ONDANSETRON HCL 4 MG PO TABS: 4.0000 mg | ORAL_TABLET | Freq: Four times a day (QID) | ORAL | 0 refills | Status: DC | PRN

## 2018-07-04 NOTE — Discharge Summary (Signed)
Physician Discharge Summary  Patient ID: Joel Peck MRN: [id number] DOB/AGE: 81-Aug-1937 81 y.o.  Admit date: 07/03/2018 Discharge date: 07/04/2018  Admission Diagnoses:  Osteoarthritis of left hip  Discharge Diagnoses:  Principal Problem:   Osteoarthritis of left hip   Past Medical History:  Diagnosis Date  . Arthritis   . Coronary artery disease   . Hypertension     Surgeries: Procedure(s): TOTAL HIP ARTHROPLASTY ANTERIOR APPROACH on 07/03/2018   Consultants (if any):   Discharged Condition: Improved  Hospital Course: Joel Peck is an 83 y.o. male who was admitted 07/03/2018 with a diagnosis of Osteoarthritis of left hip and went to the operating room on 07/03/2018 and underwent the above named procedures.    He was given perioperative antibiotics:  Anti-infectives (From admission, onward)   Start     Dose/Rate Route Frequency Ordered Stop   07/03/18 1700  ceFAZolin (ANCEF) IVPB 2g/100 mL premix     2 g 200 mL/hr over 30 Minutes Intravenous Every 6 hours 07/03/18 1430 07/03/18 2259   07/03/18 0900  ceFAZolin (ANCEF) IVPB 2g/100 mL premix     2 g 200 mL/hr over 30 Minutes Intravenous On call to O.R. 07/03/18 3762 07/03/18 1145    .  He was given sequential compression devices, early ambulation, and ASA for DVT prophylaxis.  He benefited maximally from the hospital stay and there were no complications.    Recent vital signs:  Vitals:   07/04/18 0515 07/04/18 0935  BP: (!) 162/82 (!) 166/69  Pulse: 87 (!) 102  Resp: 16   Temp: 98.3 F (36.8 C)   SpO2: 100%     Recent laboratory studies:  Lab Results  Component Value Date   HGB 10.1 (L) 07/04/2018   HGB 13.3 06/25/2018   HGB 14.2 09/19/2016   Lab Results  Component Value Date   WBC 12.5 (H) 07/04/2018   PLT 195 07/04/2018   No results found for: INR Lab Results  Component Value Date   NA 139 07/04/2018   K 3.9 07/04/2018   CL 108 07/04/2018   CO2 23 07/04/2018   BUN 16 07/04/2018   CREATININE  0.93 07/04/2018   GLUCOSE 126 (H) 07/04/2018    Discharge Medications:   Allergies as of 07/04/2018   No Known Allergies     Medication List    STOP taking these medications   acetaminophen 325 MG tablet Commonly known as: TYLENOL   Bayer Low Dose 81 MG EC tablet Generic drug: aspirin Replaced by: aspirin 81 MG chewable tablet     TAKE these medications   aspirin 81 MG chewable tablet Chew 1 tablet (81 mg total) by mouth 2 (two) times daily. Replaces: Bayer Low Dose 81 MG EC tablet   benazepril 20 MG tablet Commonly known as: LOTENSIN Take 40 mg by mouth daily.   bimatoprost 0.01 % Soln Commonly known as: LUMIGAN Place 1 drop into both eyes at bedtime.   celecoxib 200 MG capsule Commonly known as: CELEBREX Take 200 mg by mouth at bedtime.   docusate sodium 100 MG capsule Commonly known as: COLACE Take 1 capsule (100 mg total) by mouth 2 (two) times daily.   FiberCon 625 MG tablet Generic drug: polycarbophil Take 625 mg by mouth daily as needed for mild constipation.   hydrALAZINE 25 MG tablet Commonly known as: APRESOLINE Take 25 mg by mouth 3 (three) times daily.   hydrochlorothiazide 12.5 MG capsule Commonly known as: MICROZIDE Take 12.5 mg by mouth daily.  HYDROcodone-acetaminophen 5-325 MG tablet Commonly known as: NORCO/VICODIN Take 1 tablet by mouth every 4 (four) hours as needed for moderate pain (pain score 4-6).   metoprolol tartrate 25 MG tablet Commonly known as: LOPRESSOR Take 25 mg by mouth 2 (two) times daily.   omeprazole 20 MG capsule Commonly known as: PRILOSEC Take 20 mg by mouth at bedtime.   ondansetron 4 MG tablet Commonly known as: ZOFRAN Take 1 tablet (4 mg total) by mouth every 6 (six) hours as needed for nausea.   rosuvastatin 20 MG tablet Commonly known as: CRESTOR Take 20 mg by mouth at bedtime.   senna 8.6 MG Tabs tablet Commonly known as: SENOKOT Take 1 tablet (8.6 mg total) by mouth 2 (two) times daily.    tamsulosin 0.4 MG Caps capsule Commonly known as: FLOMAX Take 0.4 mg by mouth 2 (two) times a day.       Diagnostic Studies: Dg Pelvis Portable  Result Date: 07/03/2018 CLINICAL DATA:  Status post left hip replacement EXAM: PORTABLE PELVIS 1-2 VIEWS COMPARISON:  None. FINDINGS: Left hip prosthesis is noted in satisfactory position. No acute bony or soft tissue abnormality is noted. IMPRESSION: Status post left hip replacement. Electronically Signed   By: Inez Catalina M.D.   On: 07/03/2018 13:58   Dg C-arm 1-60 Min-no Report  Result Date: 07/03/2018 Fluoroscopy was utilized by the requesting physician.  No radiographic interpretation.   Dg Hip Operative Unilat W Or W/o Pelvis Left  Result Date: 07/03/2018 CLINICAL DATA:  Left hip replacement EXAM: OPERATIVE LEFT HIP WITH PELVIS COMPARISON:  None. FLUOROSCOPY TIME:  Radiation Exposure Index (as provided by the fluoroscopic device): 1.46 mGy If the device does not provide the exposure index: Fluoroscopy Time:  10 seconds Number of Acquired Images:  6 FINDINGS: Initial images demonstrate degenerative change of the left hip joint. Subsequent left hip prosthesis is noted in satisfactory position. IMPRESSION: Left hip replacement Electronically Signed   By: Inez Catalina M.D.   On: 07/03/2018 13:59    Disposition: Discharge disposition: 01-Home or Self Care       Discharge Instructions    Call MD / Call 911   Complete by: As directed    If you experience chest pain or shortness of breath, CALL 911 and be transported to the hospital emergency room.  If you develope a fever above 101 F, pus (white drainage) or increased drainage or redness at the wound, or calf pain, call your surgeon's office.   Constipation Prevention   Complete by: As directed    Drink plenty of fluids.  Prune juice may be helpful.  You may use a stool softener, such as Colace (over the counter) 100 mg twice a day.  Use MiraLax (over the counter) for constipation as needed.    Diet - low sodium heart healthy   Complete by: As directed    Driving restrictions   Complete by: As directed    No driving for 2 weeks   Increase activity slowly as tolerated   Complete by: As directed    Lifting restrictions   Complete by: As directed    No lifting for 6 weeks   TED hose   Complete by: As directed    Use stockings (TED hose) for 2 weeks on both leg(s).  You may remove them at night for sleeping.      Follow-up Information    Tanina Barb, Aaron Edelman, MD. Schedule an appointment as soon as possible for a visit in 2 weeks.  Specialty: Orthopedic Surgery Why: For wound re-check Contact information: [address] [place] [address] [phone]            Signed: Hilton Cork Murvin Gift 07/04/2018, 9:56 AM

## 2018-07-04 NOTE — Progress Notes (Signed)
Physical Therapy Treatment Patient Details Name: Joel Peck MRN: [id number] DOB: Jan 28, 1935 Today's Date: 07/04/2018    History of Present Illness 83 yo male s/p L DA-THA on 07/03/18. PMH includes OA, CAD, HTN.    PT Comments    Pt has met PT goals and is ready to DC home from PT standpoint. He ambulated 180' with RW, completed stair training, and demonstrates good understanding of THA HEP.   Follow Up Recommendations  Follow surgeon's recommendation for DC plan and follow-up therapies;Supervision for mobility/OOB(HEP)     Equipment Recommendations  None recommended by PT    Recommendations for Other Services       Precautions / Restrictions Precautions Precautions: Fall Restrictions Weight Bearing Restrictions: No LLE Weight Bearing: Weight bearing as tolerated    Mobility  Bed Mobility Overal bed mobility: Modified Independent Bed Mobility: Supine to Sit     Supine to sit: HOB elevated;Modified independent (Device/Increase time)     General bed mobility comments: used rail  Transfers Overall transfer level: Needs assistance Equipment used: Rolling walker (2 wheeled) Transfers: Sit to/from Stand Sit to Stand: Supervision         General transfer comment: Verbal cuing for hand placement when rising.  Ambulation/Gait Ambulation/Gait assistance: Modified independent (Device/Increase time) Gait Distance (Feet): 180 Feet Assistive device: Rolling walker (2 wheeled) Gait Pattern/deviations: Step-through pattern;Step-to pattern Gait velocity: decr   General Gait Details: steady, no loss of balance, good sequencing   Stairs Stairs: Yes Stairs assistance: Min guard Stair Management: No rails;Backwards;With walker Number of Stairs: 1 General stair comments: 1 step x 2 trials, VCs sequencing   Wheelchair Mobility    Modified Rankin (Stroke Patients Only)       Balance Overall balance assessment: Modified Independent                                           Cognition Arousal/Alertness: Awake/alert Behavior During Therapy: WFL for tasks assessed/performed Overall Cognitive Status: Within Functional Limits for tasks assessed                                        Exercises Total Joint Exercises Ankle Circles/Pumps: AROM;Both;10 reps;Supine Quad Sets: AROM;Both;5 reps;Supine Short Arc Quad: AROM;Left;10 reps;Supine Heel Slides: AAROM;Left;10 reps;Supine Hip ABduction/ADduction: AAROM;Left;10 reps;Supine(also x 10 LLE standing) Long Arc Quad: AROM;Left;10 reps;Seated Knee Flexion: AROM;Left;5 reps;Standing Marching in Standing: AROM;Left;10 reps;Standing Standing Hip Extension: AROM;Left;10 reps;Standing    General Comments        Pertinent Vitals/Pain Pain Assessment: No/denies pain Pain Intervention(s): Ice applied    Home Living                      Prior Function            PT Goals (current goals can now be found in the care plan section) Acute Rehab PT Goals Patient Stated Goal: return to PLOF, golf, gardening PT Goal Formulation: With patient Time For Goal Achievement: 07/10/18 Potential to Achieve Goals: Good Progress towards PT goals: Progressing toward goals    Frequency    7X/week      PT Plan Current plan remains appropriate    Co-evaluation              AM-PAC PT "6 Clicks" Mobility  Outcome Measure  Help needed turning from your back to your side while in a flat bed without using bedrails?: None Help needed moving from lying on your back to sitting on the side of a flat bed without using bedrails?: None Help needed moving to and from a bed to a chair (including a wheelchair)?: None Help needed standing up from a chair using your arms (e.g., wheelchair or bedside chair)?: None Help needed to walk in hospital room?: None Help needed climbing 3-5 steps with a railing? : A Little 6 Click Score: 23    End of Session Equipment Utilized  During Treatment: Gait belt Activity Tolerance: Patient tolerated treatment well Patient left: in chair;with call bell/phone within reach Nurse Communication: Mobility status PT Visit Diagnosis: Other abnormalities of gait and mobility (R26.89);Difficulty in walking, not elsewhere classified (R26.2)     Time: 6015-6153 PT Time Calculation (min) (ACUTE ONLY): 27 min  Charges:  $Gait Training: 8-22 mins $Therapeutic Exercise: 8-22 mins                     Blondell Reveal Kistler PT 07/04/2018  Acute Rehabilitation Services Pager [phone] Office [phone]

## 2018-07-04 NOTE — Plan of Care (Signed)
progressing 

## 2018-07-04 NOTE — Progress Notes (Signed)
The pt was provided with d/c instructions. After discussing the pt's plan of care upon d/c home, the pt reported no further questions or concerns.

## 2018-07-04 NOTE — Progress Notes (Signed)
    Subjective:  Patient reports pain as mild to moderate.  Denies N/V/CP/SOB. Wants to go home  Objective:   VITALS:   Vitals:   07/03/18 2122 07/04/18 0117 07/04/18 0515 07/04/18 0935  BP: (!) 168/73 (!) 155/68 (!) 162/82 (!) 166/69  Pulse: 89 79 87 (!) 102  Resp: 18 16 16    Temp: 97.8 F (36.6 C) 97.9 F (36.6 C) 98.3 F (36.8 C)   TempSrc: Oral Oral Oral   SpO2: 99% 99% 100%   Weight:      Height:        NAD ABD soft Sensation intact distally Intact pulses distally Dorsiflexion/Plantar flexion intact Incision: dressing C/D/I Compartment soft   Lab Results  Component Value Date   WBC 12.5 (H) 07/04/2018   HGB 10.1 (L) 07/04/2018   HCT 31.2 (L) 07/04/2018   MCV 83.2 07/04/2018   PLT 195 07/04/2018   BMET    Component Value Date/Time   NA 139 07/04/2018 0310   K 3.9 07/04/2018 0310   CL 108 07/04/2018 0310   CO2 23 07/04/2018 0310   GLUCOSE 126 (H) 07/04/2018 0310   BUN 16 07/04/2018 0310   CREATININE 0.93 07/04/2018 0310   CALCIUM 8.3 (L) 07/04/2018 0310   GFRNONAA >60 07/04/2018 0310   GFRAA >60 07/04/2018 0310     Assessment/Plan: 1 Day Post-Op   Principal Problem:   Osteoarthritis of left hip   WBAT with walker DVT ppx: Aspirin, SCDs, TEDS PO pain control PT/OT Dispo: D/C home with HEP    Hilton Cork Rivers Gassmann 07/04/2018, 9:55 AM   Rod Can, MD Cell: [phone] [place] [facility] now [facility]  [place] [address], [address], [place], [place] [address] Phone: [phone] [email or web address]  [employer]

## 2018-07-11 DIAGNOSIS — Z5189 Encounter for other specified aftercare: Secondary | ICD-10-CM | POA: Insufficient documentation

## 2018-07-16 DIAGNOSIS — Z471 Aftercare following joint replacement surgery: Secondary | ICD-10-CM | POA: Diagnosis not present

## 2018-07-16 DIAGNOSIS — Z96642 Presence of left artificial hip joint: Secondary | ICD-10-CM | POA: Diagnosis not present

## 2018-08-13 DIAGNOSIS — Z471 Aftercare following joint replacement surgery: Secondary | ICD-10-CM | POA: Diagnosis not present

## 2018-08-13 DIAGNOSIS — Z96642 Presence of left artificial hip joint: Secondary | ICD-10-CM | POA: Diagnosis not present

## 2018-08-30 DIAGNOSIS — I6523 Occlusion and stenosis of bilateral carotid arteries: Secondary | ICD-10-CM | POA: Diagnosis not present

## 2018-09-03 DIAGNOSIS — I6523 Occlusion and stenosis of bilateral carotid arteries: Secondary | ICD-10-CM | POA: Diagnosis not present

## 2018-09-03 DIAGNOSIS — I10 Essential (primary) hypertension: Secondary | ICD-10-CM | POA: Diagnosis not present

## 2018-09-03 DIAGNOSIS — Z87891 Personal history of nicotine dependence: Secondary | ICD-10-CM | POA: Diagnosis not present

## 2018-09-03 DIAGNOSIS — E785 Hyperlipidemia, unspecified: Secondary | ICD-10-CM | POA: Diagnosis not present

## 2018-10-03 DIAGNOSIS — H25013 Cortical age-related cataract, bilateral: Secondary | ICD-10-CM | POA: Diagnosis not present

## 2018-10-03 DIAGNOSIS — H401122 Primary open-angle glaucoma, left eye, moderate stage: Secondary | ICD-10-CM | POA: Diagnosis not present

## 2018-10-03 DIAGNOSIS — H401112 Primary open-angle glaucoma, right eye, moderate stage: Secondary | ICD-10-CM | POA: Diagnosis not present

## 2018-11-14 DIAGNOSIS — Z6826 Body mass index (BMI) 26.0-26.9, adult: Secondary | ICD-10-CM | POA: Diagnosis not present

## 2018-11-14 DIAGNOSIS — Z1331 Encounter for screening for depression: Secondary | ICD-10-CM | POA: Diagnosis not present

## 2018-11-14 DIAGNOSIS — N400 Enlarged prostate without lower urinary tract symptoms: Secondary | ICD-10-CM | POA: Diagnosis not present

## 2018-11-14 DIAGNOSIS — I10 Essential (primary) hypertension: Secondary | ICD-10-CM | POA: Diagnosis not present

## 2018-11-14 DIAGNOSIS — E7800 Pure hypercholesterolemia, unspecified: Secondary | ICD-10-CM | POA: Diagnosis not present

## 2018-11-14 DIAGNOSIS — Z23 Encounter for immunization: Secondary | ICD-10-CM | POA: Diagnosis not present

## 2018-11-14 DIAGNOSIS — Z0000 Encounter for general adult medical examination without abnormal findings: Secondary | ICD-10-CM | POA: Diagnosis not present

## 2018-12-04 DIAGNOSIS — R972 Elevated prostate specific antigen [PSA]: Secondary | ICD-10-CM | POA: Diagnosis not present

## 2018-12-04 DIAGNOSIS — N401 Enlarged prostate with lower urinary tract symptoms: Secondary | ICD-10-CM | POA: Diagnosis not present

## 2019-01-10 DIAGNOSIS — I2510 Atherosclerotic heart disease of native coronary artery without angina pectoris: Secondary | ICD-10-CM | POA: Diagnosis not present

## 2019-01-10 DIAGNOSIS — I351 Nonrheumatic aortic (valve) insufficiency: Secondary | ICD-10-CM | POA: Diagnosis not present

## 2019-01-10 DIAGNOSIS — I6529 Occlusion and stenosis of unspecified carotid artery: Secondary | ICD-10-CM | POA: Diagnosis not present

## 2019-01-10 DIAGNOSIS — E785 Hyperlipidemia, unspecified: Secondary | ICD-10-CM | POA: Diagnosis not present

## 2019-01-10 DIAGNOSIS — I10 Essential (primary) hypertension: Secondary | ICD-10-CM | POA: Diagnosis not present

## 2019-01-14 DIAGNOSIS — Z471 Aftercare following joint replacement surgery: Secondary | ICD-10-CM | POA: Diagnosis not present

## 2019-01-14 DIAGNOSIS — Z96642 Presence of left artificial hip joint: Secondary | ICD-10-CM | POA: Diagnosis not present

## 2019-01-17 DIAGNOSIS — I351 Nonrheumatic aortic (valve) insufficiency: Secondary | ICD-10-CM | POA: Diagnosis not present

## 2019-01-27 ENCOUNTER — Ambulatory Visit: Payer: PPO | Attending: Internal Medicine

## 2019-03-14 DIAGNOSIS — Z7982 Long term (current) use of aspirin: Secondary | ICD-10-CM | POA: Diagnosis not present

## 2019-03-14 DIAGNOSIS — I10 Essential (primary) hypertension: Secondary | ICD-10-CM | POA: Diagnosis not present

## 2019-03-14 DIAGNOSIS — E785 Hyperlipidemia, unspecified: Secondary | ICD-10-CM | POA: Diagnosis not present

## 2019-03-14 DIAGNOSIS — I2510 Atherosclerotic heart disease of native coronary artery without angina pectoris: Secondary | ICD-10-CM | POA: Diagnosis not present

## 2019-03-14 DIAGNOSIS — I6523 Occlusion and stenosis of bilateral carotid arteries: Secondary | ICD-10-CM | POA: Diagnosis not present

## 2019-04-03 DIAGNOSIS — H401122 Primary open-angle glaucoma, left eye, moderate stage: Secondary | ICD-10-CM | POA: Diagnosis not present

## 2019-04-03 DIAGNOSIS — H25013 Cortical age-related cataract, bilateral: Secondary | ICD-10-CM | POA: Diagnosis not present

## 2019-04-03 DIAGNOSIS — H401112 Primary open-angle glaucoma, right eye, moderate stage: Secondary | ICD-10-CM | POA: Diagnosis not present

## 2019-04-17 DIAGNOSIS — H401131 Primary open-angle glaucoma, bilateral, mild stage: Secondary | ICD-10-CM | POA: Diagnosis not present

## 2019-04-17 DIAGNOSIS — Z01818 Encounter for other preprocedural examination: Secondary | ICD-10-CM | POA: Diagnosis not present

## 2019-04-17 DIAGNOSIS — H25811 Combined forms of age-related cataract, right eye: Secondary | ICD-10-CM | POA: Diagnosis not present

## 2019-05-05 DIAGNOSIS — H2511 Age-related nuclear cataract, right eye: Secondary | ICD-10-CM | POA: Diagnosis not present

## 2019-05-05 DIAGNOSIS — H401131 Primary open-angle glaucoma, bilateral, mild stage: Secondary | ICD-10-CM | POA: Diagnosis not present

## 2019-05-05 DIAGNOSIS — H409 Unspecified glaucoma: Secondary | ICD-10-CM | POA: Diagnosis not present

## 2019-05-05 DIAGNOSIS — H401111 Primary open-angle glaucoma, right eye, mild stage: Secondary | ICD-10-CM | POA: Diagnosis not present

## 2019-05-05 DIAGNOSIS — H25013 Cortical age-related cataract, bilateral: Secondary | ICD-10-CM | POA: Diagnosis not present

## 2019-05-05 DIAGNOSIS — H25811 Combined forms of age-related cataract, right eye: Secondary | ICD-10-CM | POA: Diagnosis not present

## 2019-05-19 DIAGNOSIS — H409 Unspecified glaucoma: Secondary | ICD-10-CM | POA: Diagnosis not present

## 2019-05-19 DIAGNOSIS — H401131 Primary open-angle glaucoma, bilateral, mild stage: Secondary | ICD-10-CM | POA: Diagnosis not present

## 2019-05-19 DIAGNOSIS — H25812 Combined forms of age-related cataract, left eye: Secondary | ICD-10-CM | POA: Diagnosis not present

## 2019-05-19 DIAGNOSIS — H401121 Primary open-angle glaucoma, left eye, mild stage: Secondary | ICD-10-CM | POA: Diagnosis not present

## 2019-06-09 DIAGNOSIS — R351 Nocturia: Secondary | ICD-10-CM | POA: Diagnosis not present

## 2019-06-09 DIAGNOSIS — N401 Enlarged prostate with lower urinary tract symptoms: Secondary | ICD-10-CM | POA: Diagnosis not present

## 2019-06-09 DIAGNOSIS — R972 Elevated prostate specific antigen [PSA]: Secondary | ICD-10-CM | POA: Diagnosis not present

## 2019-07-31 ENCOUNTER — Other Ambulatory Visit: Payer: Self-pay

## 2019-07-31 DIAGNOSIS — Z760 Encounter for issue of repeat prescription: Secondary | ICD-10-CM | POA: Diagnosis not present

## 2019-07-31 DIAGNOSIS — Z6825 Body mass index (BMI) 25.0-25.9, adult: Secondary | ICD-10-CM | POA: Diagnosis not present

## 2019-07-31 DIAGNOSIS — Z79899 Other long term (current) drug therapy: Secondary | ICD-10-CM | POA: Diagnosis not present

## 2019-07-31 DIAGNOSIS — I10 Essential (primary) hypertension: Secondary | ICD-10-CM | POA: Diagnosis not present

## 2019-07-31 DIAGNOSIS — E7800 Pure hypercholesterolemia, unspecified: Secondary | ICD-10-CM | POA: Diagnosis not present

## 2019-09-15 DIAGNOSIS — I10 Essential (primary) hypertension: Secondary | ICD-10-CM | POA: Diagnosis not present

## 2019-09-15 DIAGNOSIS — I6523 Occlusion and stenosis of bilateral carotid arteries: Secondary | ICD-10-CM | POA: Diagnosis not present

## 2019-09-15 DIAGNOSIS — E785 Hyperlipidemia, unspecified: Secondary | ICD-10-CM | POA: Diagnosis not present

## 2019-11-06 DIAGNOSIS — H401132 Primary open-angle glaucoma, bilateral, moderate stage: Secondary | ICD-10-CM | POA: Diagnosis not present

## 2019-12-01 DIAGNOSIS — Z23 Encounter for immunization: Secondary | ICD-10-CM | POA: Diagnosis not present

## 2019-12-01 DIAGNOSIS — I10 Essential (primary) hypertension: Secondary | ICD-10-CM | POA: Diagnosis not present

## 2019-12-01 DIAGNOSIS — Z6825 Body mass index (BMI) 25.0-25.9, adult: Secondary | ICD-10-CM | POA: Diagnosis not present

## 2019-12-01 DIAGNOSIS — Z1331 Encounter for screening for depression: Secondary | ICD-10-CM | POA: Diagnosis not present

## 2019-12-01 DIAGNOSIS — Z0000 Encounter for general adult medical examination without abnormal findings: Secondary | ICD-10-CM | POA: Diagnosis not present

## 2019-12-08 DIAGNOSIS — R351 Nocturia: Secondary | ICD-10-CM | POA: Diagnosis not present

## 2019-12-08 DIAGNOSIS — R972 Elevated prostate specific antigen [PSA]: Secondary | ICD-10-CM | POA: Diagnosis not present

## 2019-12-08 DIAGNOSIS — N401 Enlarged prostate with lower urinary tract symptoms: Secondary | ICD-10-CM | POA: Diagnosis not present

## 2020-01-30 DIAGNOSIS — I10 Essential (primary) hypertension: Secondary | ICD-10-CM | POA: Diagnosis not present

## 2020-01-30 DIAGNOSIS — I6529 Occlusion and stenosis of unspecified carotid artery: Secondary | ICD-10-CM | POA: Diagnosis not present

## 2020-01-30 DIAGNOSIS — I2510 Atherosclerotic heart disease of native coronary artery without angina pectoris: Secondary | ICD-10-CM | POA: Diagnosis not present

## 2020-01-30 DIAGNOSIS — I351 Nonrheumatic aortic (valve) insufficiency: Secondary | ICD-10-CM | POA: Diagnosis not present

## 2020-01-30 DIAGNOSIS — E785 Hyperlipidemia, unspecified: Secondary | ICD-10-CM | POA: Diagnosis not present

## 2020-03-15 DIAGNOSIS — E785 Hyperlipidemia, unspecified: Secondary | ICD-10-CM | POA: Diagnosis not present

## 2020-03-15 DIAGNOSIS — I10 Essential (primary) hypertension: Secondary | ICD-10-CM | POA: Diagnosis not present

## 2020-03-15 DIAGNOSIS — I6523 Occlusion and stenosis of bilateral carotid arteries: Secondary | ICD-10-CM | POA: Diagnosis not present

## 2020-05-06 DIAGNOSIS — H401132 Primary open-angle glaucoma, bilateral, moderate stage: Secondary | ICD-10-CM | POA: Diagnosis not present

## 2020-05-31 DIAGNOSIS — N400 Enlarged prostate without lower urinary tract symptoms: Secondary | ICD-10-CM | POA: Diagnosis not present

## 2020-05-31 DIAGNOSIS — I10 Essential (primary) hypertension: Secondary | ICD-10-CM | POA: Diagnosis not present

## 2020-05-31 DIAGNOSIS — E7800 Pure hypercholesterolemia, unspecified: Secondary | ICD-10-CM | POA: Diagnosis not present

## 2020-06-07 DIAGNOSIS — R351 Nocturia: Secondary | ICD-10-CM | POA: Diagnosis not present

## 2020-06-07 DIAGNOSIS — R972 Elevated prostate specific antigen [PSA]: Secondary | ICD-10-CM | POA: Diagnosis not present

## 2020-06-07 DIAGNOSIS — N401 Enlarged prostate with lower urinary tract symptoms: Secondary | ICD-10-CM | POA: Diagnosis not present

## 2020-06-17 DIAGNOSIS — E7800 Pure hypercholesterolemia, unspecified: Secondary | ICD-10-CM | POA: Diagnosis not present

## 2020-06-17 DIAGNOSIS — Z6825 Body mass index (BMI) 25.0-25.9, adult: Secondary | ICD-10-CM | POA: Diagnosis not present

## 2020-06-17 DIAGNOSIS — I10 Essential (primary) hypertension: Secondary | ICD-10-CM | POA: Diagnosis not present

## 2020-06-17 DIAGNOSIS — Z760 Encounter for issue of repeat prescription: Secondary | ICD-10-CM | POA: Diagnosis not present

## 2020-08-27 DIAGNOSIS — R0981 Nasal congestion: Secondary | ICD-10-CM | POA: Diagnosis not present

## 2020-08-27 DIAGNOSIS — Z20828 Contact with and (suspected) exposure to other viral communicable diseases: Secondary | ICD-10-CM | POA: Diagnosis not present

## 2020-10-08 DIAGNOSIS — I2510 Atherosclerotic heart disease of native coronary artery without angina pectoris: Secondary | ICD-10-CM | POA: Diagnosis not present

## 2020-10-08 DIAGNOSIS — E785 Hyperlipidemia, unspecified: Secondary | ICD-10-CM | POA: Diagnosis not present

## 2020-10-08 DIAGNOSIS — I351 Nonrheumatic aortic (valve) insufficiency: Secondary | ICD-10-CM | POA: Diagnosis not present

## 2020-10-08 DIAGNOSIS — I10 Essential (primary) hypertension: Secondary | ICD-10-CM | POA: Diagnosis not present

## 2020-10-08 DIAGNOSIS — I6529 Occlusion and stenosis of unspecified carotid artery: Secondary | ICD-10-CM | POA: Diagnosis not present

## 2020-11-18 DIAGNOSIS — Z23 Encounter for immunization: Secondary | ICD-10-CM | POA: Diagnosis not present

## 2020-12-01 DIAGNOSIS — Z1331 Encounter for screening for depression: Secondary | ICD-10-CM | POA: Diagnosis not present

## 2020-12-01 DIAGNOSIS — Z9181 History of falling: Secondary | ICD-10-CM | POA: Diagnosis not present

## 2020-12-01 DIAGNOSIS — I10 Essential (primary) hypertension: Secondary | ICD-10-CM | POA: Diagnosis not present

## 2020-12-01 DIAGNOSIS — E7800 Pure hypercholesterolemia, unspecified: Secondary | ICD-10-CM | POA: Diagnosis not present

## 2020-12-01 DIAGNOSIS — Z0000 Encounter for general adult medical examination without abnormal findings: Secondary | ICD-10-CM | POA: Diagnosis not present

## 2020-12-01 DIAGNOSIS — Z6826 Body mass index (BMI) 26.0-26.9, adult: Secondary | ICD-10-CM | POA: Diagnosis not present

## 2020-12-01 DIAGNOSIS — N400 Enlarged prostate without lower urinary tract symptoms: Secondary | ICD-10-CM | POA: Diagnosis not present

## 2020-12-01 DIAGNOSIS — Z79899 Other long term (current) drug therapy: Secondary | ICD-10-CM | POA: Diagnosis not present

## 2020-12-11 DIAGNOSIS — I959 Hypotension, unspecified: Secondary | ICD-10-CM | POA: Diagnosis not present

## 2020-12-11 DIAGNOSIS — J9811 Atelectasis: Secondary | ICD-10-CM | POA: Diagnosis not present

## 2020-12-11 DIAGNOSIS — I63531 Cerebral infarction due to unspecified occlusion or stenosis of right posterior cerebral artery: Secondary | ICD-10-CM | POA: Diagnosis not present

## 2020-12-11 DIAGNOSIS — E785 Hyperlipidemia, unspecified: Secondary | ICD-10-CM | POA: Diagnosis not present

## 2020-12-11 DIAGNOSIS — I6523 Occlusion and stenosis of bilateral carotid arteries: Secondary | ICD-10-CM | POA: Diagnosis not present

## 2020-12-11 DIAGNOSIS — I6782 Cerebral ischemia: Secondary | ICD-10-CM | POA: Diagnosis not present

## 2020-12-11 DIAGNOSIS — Z955 Presence of coronary angioplasty implant and graft: Secondary | ICD-10-CM | POA: Diagnosis not present

## 2020-12-11 DIAGNOSIS — H53462 Homonymous bilateral field defects, left side: Secondary | ICD-10-CM | POA: Diagnosis not present

## 2020-12-11 DIAGNOSIS — Z8673 Personal history of transient ischemic attack (TIA), and cerebral infarction without residual deficits: Secondary | ICD-10-CM | POA: Diagnosis not present

## 2020-12-11 DIAGNOSIS — Z79899 Other long term (current) drug therapy: Secondary | ICD-10-CM | POA: Diagnosis not present

## 2020-12-11 DIAGNOSIS — D170 Benign lipomatous neoplasm of skin and subcutaneous tissue of head, face and neck: Secondary | ICD-10-CM | POA: Diagnosis not present

## 2020-12-11 DIAGNOSIS — I160 Hypertensive urgency: Secondary | ICD-10-CM | POA: Diagnosis not present

## 2020-12-11 DIAGNOSIS — I2510 Atherosclerotic heart disease of native coronary artery without angina pectoris: Secondary | ICD-10-CM | POA: Diagnosis not present

## 2020-12-11 DIAGNOSIS — I619 Nontraumatic intracerebral hemorrhage, unspecified: Secondary | ICD-10-CM | POA: Diagnosis not present

## 2020-12-11 DIAGNOSIS — G4700 Insomnia, unspecified: Secondary | ICD-10-CM | POA: Diagnosis not present

## 2020-12-11 DIAGNOSIS — Z9861 Coronary angioplasty status: Secondary | ICD-10-CM | POA: Diagnosis not present

## 2020-12-11 DIAGNOSIS — R4781 Slurred speech: Secondary | ICD-10-CM | POA: Diagnosis not present

## 2020-12-11 DIAGNOSIS — Z7902 Long term (current) use of antithrombotics/antiplatelets: Secondary | ICD-10-CM | POA: Diagnosis not present

## 2020-12-11 DIAGNOSIS — K219 Gastro-esophageal reflux disease without esophagitis: Secondary | ICD-10-CM | POA: Diagnosis not present

## 2020-12-11 DIAGNOSIS — N400 Enlarged prostate without lower urinary tract symptoms: Secondary | ICD-10-CM | POA: Diagnosis not present

## 2020-12-11 DIAGNOSIS — I6389 Other cerebral infarction: Secondary | ICD-10-CM | POA: Diagnosis not present

## 2020-12-11 DIAGNOSIS — D62 Acute posthemorrhagic anemia: Secondary | ICD-10-CM | POA: Diagnosis not present

## 2020-12-11 DIAGNOSIS — R29898 Other symptoms and signs involving the musculoskeletal system: Secondary | ICD-10-CM | POA: Diagnosis not present

## 2020-12-11 DIAGNOSIS — R29703 NIHSS score 3: Secondary | ICD-10-CM | POA: Diagnosis not present

## 2020-12-11 DIAGNOSIS — I639 Cerebral infarction, unspecified: Secondary | ICD-10-CM | POA: Diagnosis not present

## 2020-12-11 DIAGNOSIS — I6521 Occlusion and stenosis of right carotid artery: Secondary | ICD-10-CM | POA: Diagnosis not present

## 2020-12-11 DIAGNOSIS — R9431 Abnormal electrocardiogram [ECG] [EKG]: Secondary | ICD-10-CM | POA: Diagnosis not present

## 2020-12-11 DIAGNOSIS — R471 Dysarthria and anarthria: Secondary | ICD-10-CM | POA: Diagnosis not present

## 2020-12-11 DIAGNOSIS — D72828 Other elevated white blood cell count: Secondary | ICD-10-CM | POA: Diagnosis not present

## 2020-12-11 DIAGNOSIS — Z01810 Encounter for preprocedural cardiovascular examination: Secondary | ICD-10-CM | POA: Diagnosis not present

## 2020-12-11 DIAGNOSIS — Z9282 Status post administration of tPA (rtPA) in a different facility within the last 24 hours prior to admission to current facility: Secondary | ICD-10-CM | POA: Diagnosis not present

## 2020-12-11 DIAGNOSIS — I6623 Occlusion and stenosis of bilateral posterior cerebral arteries: Secondary | ICD-10-CM | POA: Diagnosis not present

## 2020-12-11 DIAGNOSIS — E876 Hypokalemia: Secondary | ICD-10-CM | POA: Diagnosis not present

## 2020-12-11 DIAGNOSIS — E7800 Pure hypercholesterolemia, unspecified: Secondary | ICD-10-CM | POA: Diagnosis not present

## 2020-12-11 DIAGNOSIS — R0902 Hypoxemia: Secondary | ICD-10-CM | POA: Diagnosis not present

## 2020-12-11 DIAGNOSIS — Z7982 Long term (current) use of aspirin: Secondary | ICD-10-CM | POA: Diagnosis not present

## 2020-12-11 DIAGNOSIS — Z87891 Personal history of nicotine dependence: Secondary | ICD-10-CM | POA: Diagnosis not present

## 2020-12-11 DIAGNOSIS — Z96642 Presence of left artificial hip joint: Secondary | ICD-10-CM | POA: Diagnosis not present

## 2020-12-11 DIAGNOSIS — K5909 Other constipation: Secondary | ICD-10-CM | POA: Diagnosis not present

## 2020-12-11 DIAGNOSIS — I10 Essential (primary) hypertension: Secondary | ICD-10-CM | POA: Diagnosis not present

## 2020-12-11 DIAGNOSIS — R29810 Facial weakness: Secondary | ICD-10-CM | POA: Diagnosis not present

## 2020-12-11 DIAGNOSIS — H518 Other specified disorders of binocular movement: Secondary | ICD-10-CM | POA: Diagnosis not present

## 2020-12-11 DIAGNOSIS — R531 Weakness: Secondary | ICD-10-CM | POA: Diagnosis not present

## 2020-12-11 DIAGNOSIS — N179 Acute kidney failure, unspecified: Secondary | ICD-10-CM | POA: Diagnosis not present

## 2020-12-11 DIAGNOSIS — R29713 NIHSS score 13: Secondary | ICD-10-CM | POA: Diagnosis not present

## 2020-12-11 DIAGNOSIS — I63231 Cerebral infarction due to unspecified occlusion or stenosis of right carotid arteries: Secondary | ICD-10-CM | POA: Diagnosis not present

## 2020-12-11 DIAGNOSIS — M1990 Unspecified osteoarthritis, unspecified site: Secondary | ICD-10-CM | POA: Diagnosis not present

## 2020-12-11 DIAGNOSIS — Z20822 Contact with and (suspected) exposure to covid-19: Secondary | ICD-10-CM | POA: Diagnosis not present

## 2020-12-11 DIAGNOSIS — G8194 Hemiplegia, unspecified affecting left nondominant side: Secondary | ICD-10-CM | POA: Diagnosis not present

## 2020-12-12 ENCOUNTER — Emergency Department (HOSPITAL_COMMUNITY): Payer: PPO

## 2020-12-12 ENCOUNTER — Inpatient Hospital Stay (HOSPITAL_COMMUNITY)
Admission: EM | Admit: 2020-12-12 | Discharge: 2020-12-21 | DRG: 038 | Disposition: A | Discharge status: Rehabilitation Facility | Payer: PPO | Attending: Neurology | Admitting: Neurology

## 2020-12-12 ENCOUNTER — Encounter (HOSPITAL_COMMUNITY): Payer: Self-pay | Admitting: Neurology

## 2020-12-12 ENCOUNTER — Other Ambulatory Visit: Payer: Self-pay

## 2020-12-12 ENCOUNTER — Inpatient Hospital Stay (HOSPITAL_COMMUNITY): Payer: PPO

## 2020-12-12 DIAGNOSIS — H53462 Homonymous bilateral field defects, left side: Secondary | ICD-10-CM | POA: Diagnosis present

## 2020-12-12 DIAGNOSIS — I69354 Hemiplegia and hemiparesis following cerebral infarction affecting left non-dominant side: Secondary | ICD-10-CM | POA: Diagnosis not present

## 2020-12-12 DIAGNOSIS — E785 Hyperlipidemia, unspecified: Secondary | ICD-10-CM | POA: Diagnosis present

## 2020-12-12 DIAGNOSIS — Z96642 Presence of left artificial hip joint: Secondary | ICD-10-CM | POA: Diagnosis not present

## 2020-12-12 DIAGNOSIS — K5909 Other constipation: Secondary | ICD-10-CM | POA: Diagnosis not present

## 2020-12-12 DIAGNOSIS — Z87891 Personal history of nicotine dependence: Secondary | ICD-10-CM | POA: Diagnosis not present

## 2020-12-12 DIAGNOSIS — Z823 Family history of stroke: Secondary | ICD-10-CM

## 2020-12-12 DIAGNOSIS — Z01810 Encounter for preprocedural cardiovascular examination: Secondary | ICD-10-CM | POA: Diagnosis not present

## 2020-12-12 DIAGNOSIS — N400 Enlarged prostate without lower urinary tract symptoms: Secondary | ICD-10-CM | POA: Diagnosis not present

## 2020-12-12 DIAGNOSIS — K5901 Slow transit constipation: Secondary | ICD-10-CM | POA: Diagnosis not present

## 2020-12-12 DIAGNOSIS — Z79899 Other long term (current) drug therapy: Secondary | ICD-10-CM | POA: Diagnosis not present

## 2020-12-12 DIAGNOSIS — I69319 Unspecified symptoms and signs involving cognitive functions following cerebral infarction: Secondary | ICD-10-CM | POA: Diagnosis not present

## 2020-12-12 DIAGNOSIS — R471 Dysarthria and anarthria: Secondary | ICD-10-CM | POA: Diagnosis not present

## 2020-12-12 DIAGNOSIS — R531 Weakness: Secondary | ICD-10-CM | POA: Diagnosis not present

## 2020-12-12 DIAGNOSIS — Z7982 Long term (current) use of aspirin: Secondary | ICD-10-CM | POA: Diagnosis not present

## 2020-12-12 DIAGNOSIS — I672 Cerebral atherosclerosis: Secondary | ICD-10-CM | POA: Diagnosis not present

## 2020-12-12 DIAGNOSIS — I6782 Cerebral ischemia: Secondary | ICD-10-CM | POA: Diagnosis not present

## 2020-12-12 DIAGNOSIS — Z955 Presence of coronary angioplasty implant and graft: Secondary | ICD-10-CM | POA: Diagnosis not present

## 2020-12-12 DIAGNOSIS — G4700 Insomnia, unspecified: Secondary | ICD-10-CM | POA: Diagnosis present

## 2020-12-12 DIAGNOSIS — R0902 Hypoxemia: Secondary | ICD-10-CM | POA: Diagnosis not present

## 2020-12-12 DIAGNOSIS — I69390 Apraxia following cerebral infarction: Secondary | ICD-10-CM | POA: Diagnosis not present

## 2020-12-12 DIAGNOSIS — I2510 Atherosclerotic heart disease of native coronary artery without angina pectoris: Secondary | ICD-10-CM | POA: Diagnosis not present

## 2020-12-12 DIAGNOSIS — Z9861 Coronary angioplasty status: Secondary | ICD-10-CM | POA: Diagnosis not present

## 2020-12-12 DIAGNOSIS — R29713 NIHSS score 13: Secondary | ICD-10-CM | POA: Diagnosis not present

## 2020-12-12 DIAGNOSIS — Z8249 Family history of ischemic heart disease and other diseases of the circulatory system: Secondary | ICD-10-CM

## 2020-12-12 DIAGNOSIS — H5347 Heteronymous bilateral field defects: Secondary | ICD-10-CM | POA: Diagnosis not present

## 2020-12-12 DIAGNOSIS — G8194 Hemiplegia, unspecified affecting left nondominant side: Secondary | ICD-10-CM | POA: Diagnosis not present

## 2020-12-12 DIAGNOSIS — I63531 Cerebral infarction due to unspecified occlusion or stenosis of right posterior cerebral artery: Secondary | ICD-10-CM | POA: Diagnosis not present

## 2020-12-12 DIAGNOSIS — E876 Hypokalemia: Secondary | ICD-10-CM | POA: Diagnosis present

## 2020-12-12 DIAGNOSIS — I63231 Cerebral infarction due to unspecified occlusion or stenosis of right carotid arteries: Principal | ICD-10-CM | POA: Diagnosis present

## 2020-12-12 DIAGNOSIS — K219 Gastro-esophageal reflux disease without esophagitis: Secondary | ICD-10-CM | POA: Diagnosis present

## 2020-12-12 DIAGNOSIS — R29810 Facial weakness: Secondary | ICD-10-CM | POA: Diagnosis not present

## 2020-12-12 DIAGNOSIS — I160 Hypertensive urgency: Secondary | ICD-10-CM | POA: Diagnosis not present

## 2020-12-12 DIAGNOSIS — R4781 Slurred speech: Secondary | ICD-10-CM | POA: Diagnosis not present

## 2020-12-12 DIAGNOSIS — R1310 Dysphagia, unspecified: Secondary | ICD-10-CM | POA: Diagnosis present

## 2020-12-12 DIAGNOSIS — Z7902 Long term (current) use of antithrombotics/antiplatelets: Secondary | ICD-10-CM | POA: Diagnosis not present

## 2020-12-12 DIAGNOSIS — R3911 Hesitancy of micturition: Secondary | ICD-10-CM | POA: Diagnosis present

## 2020-12-12 DIAGNOSIS — Z9889 Other specified postprocedural states: Secondary | ICD-10-CM | POA: Diagnosis not present

## 2020-12-12 DIAGNOSIS — D72828 Other elevated white blood cell count: Secondary | ICD-10-CM | POA: Diagnosis not present

## 2020-12-12 DIAGNOSIS — I69398 Other sequelae of cerebral infarction: Secondary | ICD-10-CM | POA: Diagnosis not present

## 2020-12-12 DIAGNOSIS — R4701 Aphasia: Secondary | ICD-10-CM | POA: Diagnosis present

## 2020-12-12 DIAGNOSIS — Z20822 Contact with and (suspected) exposure to covid-19: Secondary | ICD-10-CM | POA: Diagnosis not present

## 2020-12-12 DIAGNOSIS — D62 Acute posthemorrhagic anemia: Secondary | ICD-10-CM | POA: Diagnosis not present

## 2020-12-12 DIAGNOSIS — I959 Hypotension, unspecified: Secondary | ICD-10-CM | POA: Diagnosis not present

## 2020-12-12 DIAGNOSIS — I6523 Occlusion and stenosis of bilateral carotid arteries: Secondary | ICD-10-CM | POA: Diagnosis not present

## 2020-12-12 DIAGNOSIS — I6521 Occlusion and stenosis of right carotid artery: Secondary | ICD-10-CM | POA: Diagnosis not present

## 2020-12-12 DIAGNOSIS — Z9282 Status post administration of tPA (rtPA) in a different facility within the last 24 hours prior to admission to current facility: Secondary | ICD-10-CM | POA: Diagnosis not present

## 2020-12-12 DIAGNOSIS — N179 Acute kidney failure, unspecified: Secondary | ICD-10-CM | POA: Diagnosis not present

## 2020-12-12 DIAGNOSIS — J9811 Atelectasis: Secondary | ICD-10-CM | POA: Diagnosis not present

## 2020-12-12 DIAGNOSIS — I69392 Facial weakness following cerebral infarction: Secondary | ICD-10-CM | POA: Diagnosis not present

## 2020-12-12 DIAGNOSIS — I6623 Occlusion and stenosis of bilateral posterior cerebral arteries: Secondary | ICD-10-CM | POA: Diagnosis not present

## 2020-12-12 DIAGNOSIS — R9431 Abnormal electrocardiogram [ECG] [EKG]: Secondary | ICD-10-CM | POA: Diagnosis not present

## 2020-12-12 DIAGNOSIS — I69391 Dysphagia following cerebral infarction: Secondary | ICD-10-CM | POA: Diagnosis not present

## 2020-12-12 DIAGNOSIS — I639 Cerebral infarction, unspecified: Secondary | ICD-10-CM | POA: Diagnosis not present

## 2020-12-12 DIAGNOSIS — I10 Essential (primary) hypertension: Secondary | ICD-10-CM | POA: Diagnosis present

## 2020-12-12 DIAGNOSIS — R29898 Other symptoms and signs involving the musculoskeletal system: Secondary | ICD-10-CM | POA: Diagnosis not present

## 2020-12-12 DIAGNOSIS — Z8673 Personal history of transient ischemic attack (TIA), and cerebral infarction without residual deficits: Secondary | ICD-10-CM | POA: Diagnosis not present

## 2020-12-12 DIAGNOSIS — R2990 Unspecified symptoms and signs involving the nervous system: Secondary | ICD-10-CM

## 2020-12-12 DIAGNOSIS — R2689 Other abnormalities of gait and mobility: Secondary | ICD-10-CM | POA: Diagnosis not present

## 2020-12-12 DIAGNOSIS — I82442 Acute embolism and thrombosis of left tibial vein: Secondary | ICD-10-CM | POA: Diagnosis not present

## 2020-12-12 DIAGNOSIS — D170 Benign lipomatous neoplasm of skin and subcutaneous tissue of head, face and neck: Secondary | ICD-10-CM | POA: Diagnosis not present

## 2020-12-12 DIAGNOSIS — H518 Other specified disorders of binocular movement: Secondary | ICD-10-CM | POA: Diagnosis not present

## 2020-12-12 DIAGNOSIS — E7800 Pure hypercholesterolemia, unspecified: Secondary | ICD-10-CM | POA: Diagnosis not present

## 2020-12-12 DIAGNOSIS — G479 Sleep disorder, unspecified: Secondary | ICD-10-CM | POA: Diagnosis not present

## 2020-12-12 DIAGNOSIS — I63511 Cerebral infarction due to unspecified occlusion or stenosis of right middle cerebral artery: Secondary | ICD-10-CM | POA: Diagnosis not present

## 2020-12-12 DIAGNOSIS — Z820 Family history of epilepsy and other diseases of the nervous system: Secondary | ICD-10-CM | POA: Diagnosis not present

## 2020-12-12 DIAGNOSIS — I619 Nontraumatic intracerebral hemorrhage, unspecified: Secondary | ICD-10-CM | POA: Diagnosis not present

## 2020-12-12 DIAGNOSIS — J323 Chronic sphenoidal sinusitis: Secondary | ICD-10-CM | POA: Diagnosis not present

## 2020-12-12 DIAGNOSIS — M1990 Unspecified osteoarthritis, unspecified site: Secondary | ICD-10-CM | POA: Diagnosis not present

## 2020-12-12 LAB — I-STAT CHEM 8, ED
BUN: 11 mg/dL (ref 8–23)
Calcium, Ion: 1.15 mmol/L (ref 1.15–1.40)
Chloride: 106 mmol/L (ref 98–111)
Creatinine, Ser: 0.9 mg/dL (ref 0.61–1.24)
Glucose, Bld: 142 mg/dL — ABNORMAL HIGH (ref 70–99)
HCT: 42 % (ref 39.0–52.0)
Hemoglobin: 14.3 g/dL (ref 13.0–17.0)
Potassium: 4 mmol/L (ref 3.5–5.1)
Sodium: 141 mmol/L (ref 135–145)
TCO2: 27 mmol/L (ref 22–32)

## 2020-12-12 LAB — DIFFERENTIAL
Abs Immature Granulocytes: 0.03 10*3/uL (ref 0.00–0.07)
Basophils Absolute: 0.1 10*3/uL (ref 0.0–0.1)
Basophils Relative: 1 %
Eosinophils Absolute: 0.1 10*3/uL (ref 0.0–0.5)
Eosinophils Relative: 1 %
Immature Granulocytes: 0 %
Lymphocytes Relative: 26 %
Lymphs Abs: 3.4 10*3/uL (ref 0.7–4.0)
Monocytes Absolute: 0.7 10*3/uL (ref 0.1–1.0)
Monocytes Relative: 5 %
Neutro Abs: 8.7 10*3/uL — ABNORMAL HIGH (ref 1.7–7.7)
Neutrophils Relative %: 67 %

## 2020-12-12 LAB — COMPREHENSIVE METABOLIC PANEL
ALT: 14 U/L (ref 0–44)
AST: 37 U/L (ref 15–41)
Albumin: 3.6 g/dL (ref 3.5–5.0)
Alkaline Phosphatase: 57 U/L (ref 38–126)
Anion gap: 7 (ref 5–15)
BUN: 12 mg/dL (ref 8–23)
CO2: 24 mmol/L (ref 22–32)
Calcium: 8.9 mg/dL (ref 8.9–10.3)
Chloride: 107 mmol/L (ref 98–111)
Creatinine, Ser: 1.03 mg/dL (ref 0.61–1.24)
GFR, Estimated: >60 mL/min (ref >60)
Glucose, Bld: 140 mg/dL — ABNORMAL HIGH (ref 70–99)
Potassium: 4 mmol/L (ref 3.5–5.1)
Sodium: 138 mmol/L (ref 135–145)
Total Bilirubin: 0.6 mg/dL (ref 0.3–1.2)
Total Protein: 6.3 g/dL — ABNORMAL LOW (ref 6.5–8.1)

## 2020-12-12 LAB — CBC
HCT: 40.6 % (ref 39.0–52.0)
Hemoglobin: 13.1 g/dL (ref 13.0–17.0)
MCH: 27.1 pg (ref 26.0–34.0)
MCHC: 32.3 g/dL (ref 30.0–36.0)
MCV: 83.9 fL (ref 80.0–100.0)
Platelets: 251 10*3/uL (ref 150–400)
RBC: 4.84 MIL/uL (ref 4.22–5.81)
RDW: 13.7 % (ref 11.5–15.5)
WBC: 12.9 10*3/uL — ABNORMAL HIGH (ref 4.0–10.5)
nRBC: 0 % (ref 0.0–0.2)

## 2020-12-12 LAB — APTT: aPTT: 25 seconds (ref 24–36)

## 2020-12-12 LAB — PROTIME-INR
INR: 1 (ref 0.8–1.2)
Prothrombin Time: 13.3 seconds (ref 11.4–15.2)

## 2020-12-12 LAB — RESP PANEL BY RT-PCR (FLU A&B, COVID) ARPGX2
Influenza A by PCR: NEGATIVE
Influenza B by PCR: NEGATIVE
SARS Coronavirus 2 by RT PCR: NEGATIVE

## 2020-12-12 LAB — CBG MONITORING, ED: Glucose-Capillary: 141 mg/dL — ABNORMAL HIGH (ref 70–99)

## 2020-12-12 LAB — MRSA NEXT GEN BY PCR, NASAL: MRSA by PCR Next Gen: NOT DETECTED

## 2020-12-12 MED ORDER — IOHEXOL 350 MG/ML SOLN
50.0000 mL | Freq: Once | INTRAVENOUS | Status: AC | PRN
  Administered 2020-12-12: 50 mL via INTRAVENOUS

## 2020-12-12 MED ORDER — ROSUVASTATIN CALCIUM 20 MG PO TABS: 20.0000 mg | ORAL_TABLET | Freq: Every day | ORAL | Status: DC

## 2020-12-12 MED ORDER — ORAL CARE MOUTH RINSE
15.0000 mL | Freq: Two times a day (BID) | OROMUCOSAL | Status: DC
  Administered 2020-12-12 – 2020-12-21 (×16): 15 mL via OROMUCOSAL

## 2020-12-12 MED ORDER — LATANOPROST 0.005 % OP SOLN
1.0000 [drp] | Freq: Every day | OPHTHALMIC | Status: DC
  Filled 2020-12-12: qty 2.5

## 2020-12-12 MED ORDER — SODIUM CHLORIDE 0.9 % IV SOLN: INTRAVENOUS | Status: DC

## 2020-12-12 MED ORDER — ACETAMINOPHEN 160 MG/5ML PO SOLN: 650.0000 mg | ORAL | Status: DC | PRN

## 2020-12-12 MED ORDER — TENECTEPLASE FOR STROKE
0.2500 mg/kg | PACK | Freq: Once | INTRAVENOUS | Status: AC
  Administered 2020-12-12: 19 mg via INTRAVENOUS
  Filled 2020-12-12: qty 10

## 2020-12-12 MED ORDER — SENNOSIDES-DOCUSATE SODIUM 8.6-50 MG PO TABS
1.0000 | ORAL_TABLET | Freq: Every evening | ORAL | Status: DC | PRN
  Administered 2020-12-16: 1 via ORAL
  Filled 2020-12-12: qty 1

## 2020-12-12 MED ORDER — STROKE: EARLY STAGES OF RECOVERY BOOK: Freq: Once | Status: DC

## 2020-12-12 MED ORDER — ONDANSETRON HCL 4 MG/2ML IJ SOLN: 4.0000 mg | Freq: Once | INTRAMUSCULAR | Status: AC

## 2020-12-12 MED ORDER — SODIUM CHLORIDE 0.9% FLUSH
3.0000 mL | Freq: Once | INTRAVENOUS | Status: AC
Start: 2020-12-12 — End: 2020-12-12
  Administered 2020-12-12: 3 mL via INTRAVENOUS

## 2020-12-12 MED ORDER — ACETAMINOPHEN 650 MG RE SUPP: 650.0000 mg | RECTAL | Status: DC | PRN

## 2020-12-12 MED ORDER — DOCUSATE SODIUM 100 MG PO CAPS
100.0000 mg | ORAL_CAPSULE | Freq: Two times a day (BID) | ORAL | Status: DC
  Administered 2020-12-13 – 2020-12-21 (×16): 100 mg via ORAL
  Filled 2020-12-12 (×15): qty 1

## 2020-12-12 MED ORDER — TAMSULOSIN HCL 0.4 MG PO CAPS
0.4000 mg | ORAL_CAPSULE | Freq: Every day | ORAL | Status: DC
  Administered 2020-12-14 – 2020-12-21 (×8): 0.4 mg via ORAL
  Filled 2020-12-12 (×8): qty 1

## 2020-12-12 MED ORDER — PANTOPRAZOLE SODIUM 40 MG IV SOLR
40.0000 mg | Freq: Every day | INTRAVENOUS | Status: DC
  Administered 2020-12-13: 40 mg via INTRAVENOUS

## 2020-12-12 MED ORDER — CHLORHEXIDINE GLUCONATE CLOTH 2 % EX PADS
6.0000 | MEDICATED_PAD | Freq: Every day | CUTANEOUS | Status: DC
  Administered 2020-12-12 – 2020-12-21 (×9): 6 via TOPICAL

## 2020-12-12 MED ORDER — ACETAMINOPHEN 325 MG PO TABS
650.0000 mg | ORAL_TABLET | ORAL | Status: DC | PRN
  Administered 2020-12-14 – 2020-12-21 (×13): 650 mg via ORAL
  Filled 2020-12-12 (×13): qty 2

## 2020-12-12 MED ORDER — ONDANSETRON HCL 4 MG/2ML IJ SOLN
INTRAMUSCULAR | Status: AC
  Administered 2020-12-12: 4 mg via INTRAVENOUS
  Filled 2020-12-12: qty 2

## 2020-12-12 NOTE — H&P (Signed)
NEUROLOGY CONSULTATION NOTE   Date of service: December 12, 2020 Patient Name: Joel Peck MRN:  [id number] DOB:  06-Feb-1935 Reason for consult: "Stroke code" Requesting Provider: Donnetta Simpers, MD _ _ _   _ __   _ __ _ _  __ __   _ __   __ _  History of Present Illness  Joel Peck is a 85 y.o. male with PMH significant for HTN, arthritis, CAD who presents with L sided weakness, left facial droop and L hemianopsia.  He was seen at [facility] yesterday for a 1 hour episode of R leg weakness. Was discharged today at [place] 1330. He just finished eating his food around 1700 when he was noted by family to have sudden onset L sided weakness.  EMS called and he was brought in to the ED.  Is spoke with son [name] who endorsed that his symptoms of R leg weakness had completely resolved and he had no symptoms at discharge from [place] and was able to walk fine. No recent trauma, no recent sugery, no hx of CNS mets, no hx of ICH. 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  He was discharged from [place] on plavix but was not on any anticoagulation.  mRS: 0 tNKAse: discussed risks and benefits with son over phone and he consented to [facility]. Thrombectomy: not offered 2/2 no LVO. NIHSS components Score: Comment  1a Level of Conscious 0[x]  1[]  2[]  3[]      1b LOC Questions 0[x]  1[]  2[]       1c LOC Commands 0[x]  1[]  2[]       2 Best Gaze 0[]  1[x]  2[]       3 Visual 0[]  1[]  2[x]  3[]      4 Facial Palsy 0[x]  1[]  2[]  3[]      5a Motor Arm - left 0[]  1[]  2[]  3[]  4[x]  UN[]    5b Motor Arm - Right 0[x]  1[]  2[]  3[]  4[]  UN[]    6a Motor Leg - Left 0[]  1[]  2[]  3[x]  4[]  UN[]    6b Motor Leg - Right 0[x]  1[]  2[]  3[]  4[]  UN[]    7 Limb Ataxia 0[x]  1[]  2[]  3[]  UN[]     8 Sensory 0[]  1[x]  2[]  UN[]      9 Best Language 0[x]  1[]  2[]  3[]      10 Dysarthria 0[x]  1[]  2[]  UN[]      11 Extinct. and Inattention 0[]  1[]  2[x]       TOTAL: 13      ROS   Constitutional Denies weight loss, fever  and chills.   HEENT endorses changes in vision and hearing.   Respiratory Denies SOB and cough.   CV Denies palpitations and CP   GI Denies abdominal pain, nausea, vomiting and diarrhea.   GU Denies dysuria and urinary frequency.   MSK Denies myalgia and joint pain.   Skin Denies rash and pruritus.   Neurological Denies headache and syncope.   Psychiatric Denies recent changes in mood. Denies anxiety and depression.    Past History   Past Medical History:  Diagnosis Date   Arthritis    Coronary artery disease    Hypertension    Past Surgical History:  Procedure Laterality Date   CARDIAC CATHETERIZATION     TOTAL HIP ARTHROPLASTY Left 07/03/2018   Procedure: TOTAL HIP ARTHROPLASTY ANTERIOR APPROACH;  Surgeon: Rod Can, MD;  Location: [facility];  Service: Orthopedics;  Laterality: Left;   History reviewed. No pertinent family history. Social History   Socioeconomic History   Marital  status: Married    Spouse name: Not on file   Number of children: Not on file   Years of education: Not on file   Highest education level: Not on file  Occupational History   Not on file  Tobacco Use   Smoking status: Former   Smokeless tobacco: Never   Tobacco comments:    Quit smoking 40 years ago  Vaping Use   Vaping Use: Never used  Substance and Sexual Activity   Alcohol use: No   Drug use: No   Sexual activity: Not on file  Other Topics Concern   Not on file  Social History Narrative   Not on file   Social Determinants of Health   Financial Resource Strain: Not on file  Food Insecurity: Not on file  Transportation Needs: Not on file  Physical Activity: Not on file  Stress: Not on file  Social Connections: Not on file   No Known Allergies  Medications  (Not in a hospital admission)    Vitals   Vitals:   12/12/20 1915 12/12/20 1929 12/12/20 1945 12/12/20 2000  BP: (!) 126/57 (!) 121/56 (!) 125/55 (!) 115/55  Pulse:  77 68 71  Resp: 18 (!) 21 19 15   Temp:     97.6 F (36.4 C)  TempSrc:      SpO2: 94% 97% 94% 97%  Weight:      Height:         Body mass index is 26.59 kg/m.  Physical Exam   General: Laying comfortably in bed; in no acute distress. HENT: Normal oropharynx and mucosa. Normal external appearance of ears and nose.  Neck: Supple, no pain or tenderness  CV: No JVD. No peripheral edema.  Pulmonary: Symmetric Chest rise. Normal respiratory effort.  Abdomen: Soft to touch, non-tender.  Ext: No cyanosis, edema, or deformity  Skin: No rash. Normal palpation of skin.   Musculoskeletal: Normal digits and nails by inspection. No clubbing.   Neurologic Examination  Mental status/Cognition: Alert, oriented to self, place, month and year, good attention.  Speech/language: dysarthric speech, fluent, comprehension intact, object naming intact, repetition intact.  Cranial nerves:   CN II Pupils equal and reactive to light, L hemianopsia.   CN III,IV,VI EOM intact, no gaze preference or deviation, no nystagmus    CN V normal sensation in V1, V2, and V3 segments bilaterally    CN VII no asymmetry, no nasolabial fold flattening    CN VIII normal hearing to speech    CN IX & X normal palatal elevation, no uvular deviation    CN XI    CN XII midline tongue protrusion   Motor:  Muscle bulk: poor, tone flaccid in LUE and LLE. Mvmt Root Nerve  Muscle Right Left Comments  SA C5/6 Ax Deltoid 5 0   EF C5/6 Mc Biceps     EE C6/7/8 Rad Triceps 5 0   WF C6/7 Med FCR 5 0   WE C7/8 PIN ECU     F Ab C8/T1 U ADM/FDI 5 0   HF L1/2/3 Fem Illopsoas 5 3   KE L2/3/4 Fem Quad 5 4   DF L4/5 D Peron Tib Ant 5 4   PF S1/2 Tibial Grc/Sol 5 4    Reflexes:  Right Left Comments  Pectoralis      Biceps (C5/6) 1 1   Brachioradialis (C5/6) 1 1    Triceps (C6/7) 1 1    Patellar (L3/4) 1 1    Achilles (S1)  Hoffman      Plantar     Jaw jerk    Sensation:  Light touch Decreased in LUE   Pin prick    Temperature    Vibration   Proprioception     Coordination/Complex Motor:  - Finger to Nose intact in RUE - Heel to shin unable to do - Rapid alternating movement intact in RUE - Gait: unsafe to assess given his L sided weakness.  Labs   CBC:  Recent Labs  Lab 12/12/20 1745 12/12/20 1751  WBC 12.9*  --   NEUTROABS 8.7*  --   HGB 13.1 14.3  HCT 40.6 42.0  MCV 83.9  --   PLT 251  --     Basic Metabolic Panel:  Lab Results  Component Value Date   NA 141 12/12/2020   K 4.0 12/12/2020   CO2 24 12/12/2020   GLUCOSE 142 (H) 12/12/2020   BUN 11 12/12/2020   CREATININE 0.90 12/12/2020   CALCIUM 8.9 12/12/2020   GFRNONAA >60 12/12/2020   GFRAA >60 07/04/2018   Lipid Panel: No results found for: LDLCALC HgbA1c: No results found for: HGBA1C Urine Drug Screen: No results found for: LABOPIA, COCAINSCRNUR, LABBENZ, AMPHETMU, THCU, LABBARB  Alcohol Level No results found for: [place]  CT Head without contrast(Personally reviewed): CTH was negative for a large hypodensity concerning for a large territory infarct or hyperdensity concerning for an ICH  CT angio Head and Neck with contrast(Personally reviewed): No LVO  MRI Brain: pending   Impression   Joel Peck is a 85 y.o. male with PMH significant for with PMH significant for HTN, arthritis, CAD who presents with L sided weakness, left facial droop and L hemianopsia. CTH with no ICH or large territory stroke. He was given [facility] and will be admitted to the ICU for close monitoring.  Primary Diagnosis:  Cerebral infarction, unspecified.  Secondary Diagnosis: Essential (primary) hypertension  Recommendations  Plan: - Admit to Neuro ICU - Frequent NeuroChecks for post tPA care per stroke unit protocol: - Initial CTH demonstrated no acute hemorrhage or mass - MRI Brain - pending. - CTA - no LVO - TTE - pending - Lipid Panel: LDL pending.  - Statin: if LDL > 70 - HbA1c: pending. - Antithrombotic: Start ASA 81 mg daily if 24 h CTH does not show acute  hemorrhage - DVT prophylaxis: SCDs. Pharmacologic prophylaxis if 24 h CTH does not demonstrate acute hemorrhage - Systolic Blood Pressure goal: < 180 mm Hg - Telemetry monitoring for arrhythmia: 72 hours - Swallow screen - ordered - PT/OT/SLP consults  HLD: - continue home crestor  HTN: Hld home meds given concern for stroke. BP goal post tNKAse as above.  GERD: - continue home protonix.  This patient is critically ill and at significant risk of neurological worsening, death and care requires constant monitoring of vital signs, hemodynamics,respiratory and cardiac monitoring, neurological assessment, discussion with family, other specialists and medical decision making of high complexity. I spent 50 minutes of neurocritical care time  in the care of  this patient. This was time spent independent of any time provided by nurse practitioner or PA.  Donnetta Simpers [facility] Pager Number [phone] 12/12/2020  8:33 PM    ______________________________________________________________________   Thank you for the opportunity to take part in the care of this patient. If you have any further questions, please contact the neurology consultation attending.  Signed,  [place] Pager Number [phone] _ _ _   _ __  _ __ _ _  __ __   _ __   __ _  

## 2020-12-12 NOTE — ED Provider Notes (Signed)
[place] EMERGENCY DEPARTMENT Provider Note   CSN: [id number] Arrival date & time: 12/12/20  1743  An emergency department physician performed an initial assessment on this suspected stroke patient at 1742.  History Chief Complaint  Patient presents with   Code Stroke    AYCE PIETRZYK is a 85 y.o. male.  HPI Patient is 85 year old male who presents for strokelike symptoms.  History is provided by family.  Patient had reported right-sided weakness yesterday.  He went to [facility] where he underwent CT imaging of his head and ultrasound imaging of his neck.  He was observed overnight.  He had resolution of symptoms and was discharged earlier this afternoon.  When he went home, patient was sitting at dinner with family.  At that time, he began to feel unwell, had vomiting, and had witnessed left-sided weakness.  EMS was called.  Patient arrives via EMS who noted right eye deviation and left-sided weakness.  Patient's vital signs remained normal during transit.    Past Medical History:  Diagnosis Date   Arthritis    Coronary artery disease    Hypertension     Patient Active Problem List   Diagnosis Date Noted   Stroke determined by clinical assessment ([place]) 12/12/2020   Osteoarthritis of left hip 07/03/2018    Past Surgical History:  Procedure Laterality Date   CARDIAC CATHETERIZATION     TOTAL HIP ARTHROPLASTY Left 07/03/2018   Procedure: TOTAL HIP ARTHROPLASTY ANTERIOR APPROACH;  Surgeon: Rod Can, MD;  Location: [facility];  Service: Orthopedics;  Laterality: Left;       History reviewed. No pertinent family history.  Social History   Tobacco Use   Smoking status: Former   Smokeless tobacco: Never   Tobacco comments:    Quit smoking 40 years ago  Vaping Use   Vaping Use: Never used  Substance Use Topics   Alcohol use: No   Drug use: No    Home Medications Prior to Admission medications   Medication Sig Start Date End Date  Taking? Authorizing Provider  acetaminophen (TYLENOL) 325 MG tablet Take 650 mg by mouth every 6 (six) hours as needed for headache (pain).   Yes [provider]  amLODipine (NORVASC) 2.5 MG tablet Take 2.5 mg by mouth every morning. 10/30/20  Yes [provider]  aspirin EC 81 MG tablet Take 81 mg by mouth every morning. Swallow whole.   Yes [provider]  benazepril (LOTENSIN) 20 MG tablet Take 40 mg by mouth every morning.   Yes [provider]  celecoxib (CELEBREX) 200 MG capsule Take 200 mg by mouth at bedtime.    Yes [provider]  hydrALAZINE (APRESOLINE) 25 MG tablet Take 25 mg by mouth 3 (three) times daily.   Yes [provider]  magnesium hydroxide (MILK OF MAGNESIA) 400 MG/5ML suspension Take 5 mLs by mouth daily as needed for mild constipation.   Yes [provider]  metoprolol tartrate (LOPRESSOR) 25 MG tablet Take 25 mg by mouth 2 (two) times daily.   Yes [provider]  omeprazole (PRILOSEC) 20 MG capsule Take 20 mg by mouth at bedtime.   Yes [provider]  tamsulosin (FLOMAX) 0.4 MG CAPS capsule Take 0.4 mg by mouth 2 (two) times a day.    Yes [provider]  atorvastatin (LIPITOR) 40 MG tablet Take 40 mg by mouth at bedtime. Patient not taking: Reported on 12/12/2020 12/12/20   [provider]  bimatoprost (LUMIGAN) 0.01 %  SOLN Place 1 drop into both eyes at bedtime. Patient not taking: Reported on 12/12/2020    [provider]  clopidogrel (PLAVIX) 75 MG tablet Take 75 mg by mouth daily. Patient not taking: Reported on 12/12/2020 12/12/20   [provider]  docusate sodium (COLACE) 100 MG capsule Take 1 capsule (100 mg total) by mouth 2 (two) times daily. Patient not taking: Reported on 12/12/2020 07/04/18   Rod Can, MD  rosuvastatin (CRESTOR) 20 MG tablet Take 20 mg by mouth at bedtime. Patient not taking: Reported on 12/12/2020    [provider]    Allergies    Patient has no known allergies.  Review of Systems   Review of Systems  Unable to perform ROS: Acuity of condition   Physical Exam Updated Vital Signs BP 131/65   Pulse 73   Temp 98.7 F (37.1 C) (Oral)   Resp 19   Ht 5\' 7"  (1.702 m)   Wt 77 kg   SpO2 98%   BMI 26.59 kg/m   Physical Exam Vitals and nursing note reviewed.  Constitutional:      General: He is not in acute distress.    Appearance: He is well-developed. He is ill-appearing. He is not toxic-appearing or diaphoretic.  HENT:     Head: Normocephalic and atraumatic.     Right Ear: External ear normal.     Left Ear: External ear normal.     Nose: Nose normal.     Mouth/Throat:     Mouth: Mucous membranes are moist.     Pharynx: Oropharynx is clear.  Eyes:     General: Visual field deficit present.     Conjunctiva/sclera: Conjunctivae normal.     Comments: Bilateral eye deviation to the right.  Unable to deviate eyes past midline to the left.  Cardiovascular:     Rate and Rhythm: Normal rate and regular rhythm.     Heart sounds: No murmur heard. Pulmonary:     Effort: Pulmonary effort is normal. No respiratory distress.     Breath sounds: Normal breath sounds. No wheezing or rales.  Abdominal:     Palpations: Abdomen is soft.     Tenderness: There is no abdominal tenderness.  Musculoskeletal:        General: No swelling.     Cervical back: Neck supple.     Right lower leg: No edema.     Left lower leg: No edema.  Skin:    General: Skin is warm and dry.     Coloration: Skin is not jaundiced or pale.  Neurological:     Cranial Nerves: Cranial nerve deficit and facial asymmetry present.     Sensory: Sensation is intact.     Motor: Weakness (Left hemibody) present.    ED Results / Procedures / Treatments   Labs (all labs ordered are listed, but only abnormal results are displayed) Labs Reviewed  CBC - Abnormal; Notable for the following components:      Result Value    WBC 12.9 (*)    All other components within normal limits  DIFFERENTIAL - Abnormal; Notable for the following components:   Neutro Abs 8.7 (*)    All other components within normal limits  COMPREHENSIVE METABOLIC PANEL - Abnormal; Notable for the following components:   Glucose, Bld 140 (*)    Total Protein 6.3 (*)    All other components within normal limits  I-STAT CHEM 8, ED - Abnormal; Notable for the following components:  Glucose, Bld 142 (*)    All other components within normal limits  CBG MONITORING, ED - Abnormal; Notable for the following components:   Glucose-Capillary 141 (*)    All other components within normal limits  RESP PANEL BY RT-PCR (FLU A&B, COVID) ARPGX2  MRSA NEXT GEN BY PCR, NASAL  PROTIME-INR  APTT  HEMOGLOBIN A1C  LIPID PANEL    EKG None  Radiology MR BRAIN WO CONTRAST  Result Date: 12/12/2020 CLINICAL DATA:  Initial evaluation for neuro deficit, stroke suspected. EXAM: MRI HEAD WITHOUT CONTRAST TECHNIQUE: Multiplanar, multiecho pulse sequences of the brain and surrounding structures were obtained without intravenous contrast. COMPARISON:  Prior CTs from earlier the same day. FINDINGS: Brain: Cerebral volume within normal limits for age. Mild scattered patchy T2/FLAIR hyperintensity involving the periventricular white matter, nonspecific, but likely related chronic microvascular ischemic disease, fairly minimal in nature in felt to be within normal limits for age. Patchy restricted diffusion seen involving the posterior limb of the right internal capsule, with extension to involve the right periatrial white matter and mesial right temporal lobe, with involvement of the right hippocampus (series 5, images 73, 70, 68). No associated hemorrhage or mass effect. No other evidence for acute or subacute ischemia. Gray-white matter differentiation otherwise maintained with no other areas of chronic cortical infarction. No acute intracranial hemorrhage. Single  punctate chronic microhemorrhage noted within the right basal ganglia, of doubtful significance in isolation. No mass lesion, midline shift or mass effect. No hydrocephalus or extra-axial fluid collection. Pituitary gland suprasellar region within normal limits. Midline structures intact. Vascular: Major intracranial vascular flow voids are maintained. Skull and upper cervical spine: Craniocervical junction within normal limits. Bone marrow signal intensity diffusely decreased on T1 weighted imaging, nonspecific, but most commonly related to anemia, smoking, obesity. No focal marrow replacing lesion. Small lipoma noted at the right frontal scalp. Sinuses/Orbits: Patient status post bilateral ocular lens replacement. Globes and orbital soft tissues demonstrate no acute finding. Paranasal sinuses are largely clear. No mastoid effusion. Inner ear structures grossly normal. Other: None. IMPRESSION: 1. Patchy acute ischemic nonhemorrhagic infarct involving the posterior limb of the right internal capsule, right periatrial white matter, and mesial right temporal lobe. 2. Otherwise normal brain MRI for age. Electronically Signed   By: Jeannine Boga M.D.   On: 12/12/2020 23:37   CT HEAD CODE STROKE WO CONTRAST  Result Date: 12/12/2020 CLINICAL DATA:  Code stroke.  Slurred speech.  Left-sided weakness. EXAM: CT HEAD WITHOUT CONTRAST TECHNIQUE: Contiguous axial images were obtained from the base of the skull through the vertex without intravenous contrast. COMPARISON:  CT head 12/11/2020 FINDINGS: Brain: No evidence of acute infarction, hemorrhage, hydrocephalus, extra-axial collection or mass lesion/mass effect. Vascular: Negative for hyperdense vessel Skull: Negative Sinuses/Orbits: Paranasal sinuses clear. Bilateral cataract extraction Other: None ASPECTS ([place] Stroke Program Early CT Score) - Ganglionic level infarction (caudate, lentiform nuclei, internal capsule, insula, M1-M3 cortex): 7 - Supraganglionic  infarction (M4-M6 cortex): 3 Total score (0-10 with 10 being normal): 10 IMPRESSION: 1. No acute abnormality and no change from yesterday ASPECTS is 10 2. These results were called by telephone at the time of interpretation on 12/12/2020 at 5:59 pm to provider Lorrin Goodell, who verbally acknowledged these results. Electronically Signed   By: Franchot Gallo M.D.   On: 12/12/2020 18:00   CT ANGIO HEAD NECK W WO CM (CODE STROKE)  Result Date: 12/12/2020 CLINICAL DATA:  Acute neuro deficit. Left-sided deficit. Slurred speech. EXAM: CT ANGIOGRAPHY HEAD AND NECK TECHNIQUE: Multidetector CT  imaging of the head and neck was performed using the standard protocol during bolus administration of intravenous contrast. Multiplanar CT image reconstructions and MIPs were obtained to evaluate the vascular anatomy. Carotid stenosis measurements (when applicable) are obtained utilizing NASCET criteria, using the distal internal carotid diameter as the denominator. CONTRAST:  3mL OMNIPAQUE IOHEXOL 350 MG/ML SOLN COMPARISON:  CT head 12/12/2020.  CT angio head neck 12/11/2020 FINDINGS: CTA NECK FINDINGS Aortic arch: Atherosclerotic calcification aortic arch and proximal great vessels. No significant stenosis. Right carotid system: Atherosclerotic calcification right carotid bifurcation. 55% diameter stenosis distal common carotid artery. Approximately 50% diameter stenosis proximal right internal carotid artery. Stenosis at the origin of the right external carotid artery Left carotid system: Atherosclerotic calcification left carotid bifurcation. 30% diameter stenosis distal left common carotid artery mild stenosis proximal left internal carotid artery. Vertebral arteries: Both vertebral arteries patent to the skull base without significant stenosis. Skeleton: Cervical spondylosis.  No acute abnormality. Other neck: Negative Upper chest: Lung apices clear bilaterally. Review of the MIP images confirms the above findings CTA HEAD  FINDINGS Anterior circulation: Atherosclerotic calcification in the cavernous carotid bilaterally with mild to moderate stenosis bilaterally. Anterior and middle cerebral arteries patent bilaterally without stenosis or large vessel occlusion. Posterior circulation: Both vertebral arteries patent to the basilar. Mild stenosis distal vertebral artery bilaterally. Basilar widely patent. Superior cerebellar arteries patent bilaterally. Posterior cerebral arteries patent with mild proximal stenosis bilaterally. Negative for large vessel occlusion. Venous sinuses: Normal venous enhancement. Anatomic variants: None Review of the MIP images confirms the above findings IMPRESSION: 1. Negative for intracranial large vessel occlusion. 2. Mild PCA stenosis bilaterally. 3. Mild to moderate stenosis in the cavernous carotid bilaterally 4. 55% diameter stenosis distal common carotid artery on the right with 50% diameter stenosis proximal right internal carotid artery. 5. 30% diameter stenosis distal left common carotid artery and mild stenosis proximal left internal carotid artery 6. Both vertebral arteries patent with mild stenosis distally. 7. These results were called by telephone at the time of interpretation on 12/12/2020 at 6:15 pm to provider [facility] , who verbally acknowledged these results. Electronically Signed   By: Franchot Gallo M.D.   On: 12/12/2020 18:28    Procedures Procedures   Medications Ordered in ED Medications   stroke: mapping our early stages of recovery book (has no administration in time range)  0.9 %  sodium chloride infusion ( Intravenous New Bag/Given 12/12/20 2132)  acetaminophen (TYLENOL) tablet 650 mg (has no administration in time range)    Or  acetaminophen (TYLENOL) 160 MG/5ML solution 650 mg (has no administration in time range)    Or  acetaminophen (TYLENOL) suppository 650 mg (has no administration in time range)  senna-docusate (Senokot-S) tablet 1 tablet (has no  administration in time range)  pantoprazole (PROTONIX) injection 40 mg (40 mg Intravenous Not Given 12/12/20 2354)  docusate sodium (COLACE) capsule 100 mg (100 mg Oral Not Given 12/12/20 2356)  rosuvastatin (CRESTOR) tablet 20 mg (20 mg Oral Not Given 12/12/20 2354)  tamsulosin (FLOMAX) capsule 0.4 mg (0.4 mg Oral Not Given 12/12/20 2356)  MEDLINE mouth rinse (15 mLs Mouth Rinse Given 12/12/20 2100)  Chlorhexidine Gluconate Cloth 2 % PADS 6 each (6 each Topical Given 12/12/20 2104)  sodium chloride flush (NS) 0.9 % injection 3 mL (3 mLs Intravenous Given by Other 12/12/20 1755)  tenecteplase (TNKASE) injection for Stroke 19 mg (19 mg Intravenous Given by Other 12/12/20 1755)  iohexol (OMNIPAQUE) 350 MG/ML injection 50 mL (50 mLs  Intravenous Contrast Given 12/12/20 1812)  ondansetron (ZOFRAN) injection 4 mg (4 mg Intravenous Given 12/12/20 1911)    ED Course  I have reviewed the triage vital signs and the nursing notes.  Pertinent labs & imaging results that were available during my care of the patient were reviewed by me and considered in my medical decision making (see chart for details).    MDM Rules/Calculators/A&P                         CRITICAL CARE Performed by: Godfrey Pick   Total critical care time: 35 minutes  Critical care time was exclusive of separately billable procedures and treating other patients.  Critical care was necessary to treat or prevent imminent or life-threatening deterioration.  Critical care was time spent personally by me on the following activities: development of treatment plan with patient and/or surrogate as well as nursing, discussions with consultants, evaluation of patient's response to treatment, examination of patient, obtaining history from patient or surrogate, ordering and performing treatments and interventions, ordering and review of laboratory studies, ordering and review of radiographic studies, pulse oximetry and re-evaluation of patient's  condition.   Patient is 85 year old male who presents for strokelike symptoms.  Onset was 1620.  At the time, he was eating dinner with his family.  He had witnessed vomiting, facial asymmetry, and left-sided weakness.  Prior to this onset, he was in his normal state of health, although he did stay in [facility] last night for TIA work-up.  Patient arrives as a code stroke.  On arrival, airway is intact.  He was taken to CT scanner for stroke imaging.  Initial NIHSS was 13.  CT scan showed no ICH and no LVO.  Given his symptoms, patient was given TNKase.  He was observed in the ED for over 1 hour prior to transfer to the neuro ICU.  During this ED observation, he had improvement in his strokelike symptoms.  Family arrived at bedside were able to provide history.  Patient was transported to ICU in stable condition.  Final Clinical Impression(s) / ED Diagnoses Final diagnoses:  Left-sided weakness  Stroke-like symptoms    Rx / DC Orders ED Discharge Orders     None        Godfrey Pick, MD 12/13/20 0139

## 2020-12-12 NOTE — Progress Notes (Signed)
PHARMACIST CODE STROKE RESPONSE  Notified to mix TNK at 1751 by Dr. Lorrin Goodell Delivered TNK to RN at 1753  TNK dose = 19 mg IV over 5 seconds.   Issues/delays encountered (if applicable): None  Albertina Parr, PharmD., BCPS, BCCCP Clinical Pharmacist Please refer to [facility] for unit-specific pharmacist

## 2020-12-12 NOTE — ED Triage Notes (Signed)
Pt BIB EMS as a code stroke. Pt was recently discharged from [facility] at around 1330 today. He was seen and admitted there for R leg weakness, which seemed to be resolved. At 1620 today, family started to notice left sided weakness and a facial droop and immediately called EMS. EMS noted that pt had partial eye deviation and would not move past midline.   VSS stable w/ EMS.

## 2020-12-13 ENCOUNTER — Other Ambulatory Visit (HOSPITAL_COMMUNITY): Payer: PPO

## 2020-12-13 ENCOUNTER — Inpatient Hospital Stay (HOSPITAL_COMMUNITY): Payer: PPO

## 2020-12-13 DIAGNOSIS — I672 Cerebral atherosclerosis: Secondary | ICD-10-CM | POA: Diagnosis not present

## 2020-12-13 DIAGNOSIS — J323 Chronic sphenoidal sinusitis: Secondary | ICD-10-CM | POA: Diagnosis not present

## 2020-12-13 DIAGNOSIS — I6523 Occlusion and stenosis of bilateral carotid arteries: Secondary | ICD-10-CM

## 2020-12-13 DIAGNOSIS — Z8673 Personal history of transient ischemic attack (TIA), and cerebral infarction without residual deficits: Secondary | ICD-10-CM | POA: Diagnosis not present

## 2020-12-13 DIAGNOSIS — I63231 Cerebral infarction due to unspecified occlusion or stenosis of right carotid arteries: Principal | ICD-10-CM

## 2020-12-13 DIAGNOSIS — Z9282 Status post administration of tPA (rtPA) in a different facility within the last 24 hours prior to admission to current facility: Secondary | ICD-10-CM

## 2020-12-13 LAB — LIPID PANEL
Cholesterol: 165 mg/dL (ref 0–200)
HDL: 48 mg/dL (ref >40)
LDL Cholesterol: 111 mg/dL — ABNORMAL HIGH (ref 0–99)
Total CHOL/HDL Ratio: 3.4 RATIO
Triglycerides: 28 mg/dL (ref <150)
VLDL: 6 mg/dL (ref 0–40)

## 2020-12-13 LAB — HEMOGLOBIN A1C
Hgb A1c MFr Bld: 6.4 % — ABNORMAL HIGH (ref 4.8–5.6)
Mean Plasma Glucose: 136.98 mg/dL

## 2020-12-13 MED ORDER — ROSUVASTATIN CALCIUM 20 MG PO TABS
40.0000 mg | ORAL_TABLET | Freq: Every day | ORAL | Status: DC
  Administered 2020-12-13 – 2020-12-20 (×8): 40 mg via ORAL
  Filled 2020-12-13 (×8): qty 2

## 2020-12-13 MED ORDER — PANTOPRAZOLE SODIUM 40 MG PO TBEC
40.0000 mg | DELAYED_RELEASE_TABLET | Freq: Every day | ORAL | Status: DC
  Administered 2020-12-13 – 2020-12-21 (×9): 40 mg via ORAL
  Filled 2020-12-13 (×9): qty 1

## 2020-12-13 MED ORDER — ASPIRIN EC 325 MG PO TBEC
325.0000 mg | DELAYED_RELEASE_TABLET | Freq: Every day | ORAL | Status: DC
  Administered 2020-12-13 – 2020-12-21 (×9): 325 mg via ORAL
  Filled 2020-12-13 (×9): qty 1

## 2020-12-13 MED ORDER — ENOXAPARIN SODIUM 40 MG/0.4ML IJ SOSY
40.0000 mg | PREFILLED_SYRINGE | INTRAMUSCULAR | Status: DC
  Administered 2020-12-13 – 2020-12-19 (×7): 40 mg via SUBCUTANEOUS
  Filled 2020-12-13 (×7): qty 0.4

## 2020-12-13 MED ORDER — CLOPIDOGREL BISULFATE 75 MG PO TABS
75.0000 mg | ORAL_TABLET | Freq: Every day | ORAL | Status: DC
  Administered 2020-12-13 – 2020-12-21 (×9): 75 mg via ORAL
  Filled 2020-12-13 (×9): qty 1

## 2020-12-13 NOTE — Evaluation (Signed)
Occupational Therapy Evaluation Patient Details Name: Joel Peck MRN: [id number] DOB: March 17, 1935 Today's Date: 12/13/2020   History of Present Illness Pt is 85 yo male who presented on 12/12/20 with L facial droop, L weakness, and L hemianopsia. Pt found to have Patchy acute ischemic nonhemorrhagic infarct involving the posterior limb of the right internal capsule, right periatrial white  matter, and mesial right temporal lobe.  Pt was given tNKAase and admitted to ICU.  Pt with hx of HTN, arthritis, THA, and CAD.   Clinical Impression   PTA pt lives at home independently and enjoys playing gold. Daughter lives in the same house with her father. Pt currently requires Mod A +2 with stand pivot transfers and Max A with LB ADL tasks due to deficits listed below. Pt with apparent L inattention and visual field cut. Recommend intensive rehab at AIR to maximize functional level of independence with mobility and ADL tasks to facilitate safe DC home with supportive family.  Will follow acutely.      Recommendations for follow up therapy are one component of a multi-disciplinary discharge planning process, led by the attending physician.  Recommendations may be updated based on patient status, additional functional criteria and insurance authorization.   Follow Up Recommendations  Acute inpatient rehab (3hours/day)    Assistance Recommended at Discharge Frequent or constant Supervision/Assistance  Functional Status Assessment  Patient has had a recent decline in their functional status and demonstrates the ability to make significant improvements in function in a reasonable and predictable amount of time.  Equipment Recommendations  BSC/3in1    Recommendations for Other Services Rehab consult     Precautions / Restrictions Precautions Precautions: Fall      Mobility Bed Mobility Overal bed mobility: Needs Assistance Bed Mobility: Rolling;Sidelying to Sit Rolling: Min assist Sidelying  to sit: Mod assist;HOB elevated       General bed mobility comments: Pt requiring cues for technique, increased time, and mod A to lift trunk    Transfers Overall transfer level: Needs assistance Equipment used: 2 person hand held assist Transfers: Sit to/from Stand;Bed to chair/wheelchair/BSC Sit to Stand: Mod assist;+2 physical assistance     Step pivot transfers: Mod assist;+2 physical assistance     General transfer comment: Pt requiring mod A to rise with L knee blocked for safety; with steps to chair requiring cues for sequencing, assist to move L LE, L knee blocked, and increased support when WBing on L LE.      Balance Overall balance assessment: Needs assistance Sitting-balance support: Single extremity supported;No upper extremity supported Sitting balance-Leahy Scale: Fair Sitting balance - Comments: Pt initially using R UE but able to progress to no UE use with close supervision. Tends to look R and lean slightly L - could correct with cues     Standing balance-Leahy Scale: Poor Standing balance comment: Requiring mod A of 2                           ADL either performed or assessed with clinical judgement   ADL Overall ADL's : Needs assistance/impaired Eating/Feeding: NPO   Grooming: Moderate assistance   Upper Body Bathing: Moderate assistance   Lower Body Bathing: Maximal assistance   Upper Body Dressing : Moderate assistance       Toilet Transfer: Moderate assistance;+2 for physical assistance   Toileting- Clothing Manipulation and Hygiene: Maximal assistance       Functional mobility during ADLs: Moderate assistance;+2 for  physical assistance       Vision Baseline Vision/History: 1 Wears glasses (reading) Vision Assessment?: Vision impaired- to be further tested in functional context;Yes Eye Alignment: Within Functional Limits Alignment/Gaze Preference: Head turned (R) Tracking/Visual Pursuits: Able to track stimulus in all  quads without difficulty Saccades: Decreased speed of saccadic movement Visual Fields: Left visual field deficit     Perception Perception Comments: Missing targets in L field during double simultaneous stimulation   Praxis      Pertinent Vitals/Pain Pain Assessment: No/denies pain     Hand Dominance Right   Extremity/Trunk Assessment Upper Extremity Assessment Upper Extremity Assessment: LUE deficits/detail LUE Deficits / Details: minimal movement LUE; nonfunctional at this time; PROM WFL; able to spontaneously demonstrate minimal isolated movement out of synergy; Did not make fist/could not extend; daughter reports movement was better earlier this am LUE Coordination: decreased fine motor;decreased gross motor   Lower Extremity Assessment Lower Extremity Assessment: Defer to PT evaluation RCervical / Trunk Assessment Cervical / Trunk Assessment: Other exceptions Cervical / Trunk Exceptions: L inattention - tending to keep head rotated R but could correct with cues; R bias   Communication Communication Communication: No difficulties   Cognition Arousal/Alertness: Awake/alert Behavior During Therapy: WFL for tasks assessed/performed Overall Cognitive Status: Impaired/Different from baseline Area of Impairment: Attention;Problem solving;Safety/judgement;Awareness                   Current Attention Level: Selective     Safety/Judgement: Decreased awareness of deficits Awareness: Emergent Problem Solving: Difficulty sequencing;Slow processing General Comments: Does demonstrate L inattention and with higher level cognitive deficits (difficulty with clock drawing). will further assess     General Comments  Began education regarding L inattention and recommendation for AIR    Exercises     Shoulder Instructions      Home Living Family/patient expects to be discharged to:: Private residence Living Arrangements: Children (daughter lives with him) Available Help  at Discharge: Family;Available 24 hours/day (Daughter works at night but has 3 other children who can assist) Type of Home: House Home Access: Other (comment) (threshold)     Home Layout: One level     Bathroom Shower/Tub: Teacher, early years/pre: Handicapped height Bathroom Accessibility: Yes How Accessible: Accessible via wheelchair Home Equipment: Tub bench;Rolling Walker (2 wheels);Cane - single point;BSC/3in1          Prior Functioning/Environment Prior Level of Function : Independent/Modified Independent;Driving             Mobility Comments: Pt could ambulate in community; likes to play golf ADLs Comments: Independent with ADLs, IADLs,  finances, and medication        OT Problem List: Decreased strength;Decreased range of motion;Decreased activity tolerance;Impaired balance (sitting and/or standing);Impaired vision/perception;Decreased coordination;Decreased cognition;Decreased safety awareness;Decreased knowledge of use of DME or AE;Impaired UE functional use      OT Treatment/Interventions: Self-care/ADL training;Therapeutic exercise;Neuromuscular education;DME and/or AE instruction;Therapeutic activities;Cognitive remediation/compensation;Visual/perceptual remediation/compensation;Patient/family education;Balance training    OT Goals(Current goals can be found in the care plan section) Acute Rehab OT Goals Patient Stated Goal: to get better OT Goal Formulation: With patient/family Time For Goal Achievement: 12/27/20 Potential to Achieve Goals: Good  OT Frequency: Min 2X/week   Barriers to D/C:            Co-evaluation PT/OT/SLP Co-Evaluation/Treatment: Yes Reason for Co-Treatment: Complexity of the patient's impairments (multi-system involvement);For patient/therapist safety;To address functional/ADL transfers PT goals addressed during session: Mobility/safety with mobility;Balance OT goals addressed during session: ADL's and self-care  AM-PAC OT "6 Clicks" Daily Activity     Outcome Measure Help from another person eating meals?: Total Help from another person taking care of personal grooming?: A Lot Help from another person toileting, which includes using toliet, bedpan, or urinal?: A Lot Help from another person bathing (including washing, rinsing, drying)?: A Lot Help from another person to put on and taking off regular upper body clothing?: A Lot Help from another person to put on and taking off regular lower body clothing?: A Lot 6 Click Score: 11   End of Session Equipment Utilized During Treatment: Gait belt Nurse Communication: Mobility status  Activity Tolerance: Patient tolerated treatment well Patient left: in chair;with call bell/phone within reach;with chair alarm set;with family/visitor present  OT Visit Diagnosis: Unsteadiness on feet (R26.81);Other abnormalities of gait and mobility (R26.89);Muscle weakness (generalized) (M62.81);Other symptoms and signs involving the nervous system (R29.898);Hemiplegia and hemiparesis Hemiplegia - Right/Left: Left Hemiplegia - dominant/non-dominant: Non-Dominant Hemiplegia - caused by: Cerebral infarction                Time: 8280-0349 OT Time Calculation (min): 32 min Charges:  OT General Charges $OT Visit: 1 Visit OT Evaluation $OT Eval Moderate Complexity: [place], OT/L   Acute OT Clinical Specialist Acute Rehabilitation Services Pager [phone] Office [phone]   [facility] 12/13/2020, 3:37 PM

## 2020-12-13 NOTE — Progress Notes (Signed)
PT Cancellation Note  Patient Details Name: Joel Peck MRN: [id number] DOB: [birth year]   Cancelled Treatment:    Reason Eval/Treat Not Completed: Active bedrest order Pt given tNKAase for CVA and with strict bedrest orders. Will f/u as appropriate once bedrest lifted. Abran Richard, PT Acute Rehab Services Pager [phone] [facility] [place] 12/13/2020, 9:45 AM

## 2020-12-13 NOTE — Evaluation (Signed)
Clinical/Bedside Swallow Evaluation Patient Details  Name: Joel Peck MRN: [id number] Date of Birth: 11/28/1935  Today's Date: 12/13/2020 Time: SLP Start Time (ACUTE ONLY): 1150 SLP Stop Time (ACUTE ONLY): 1200 SLP Time Calculation (min) (ACUTE ONLY): 10 min  Past Medical History:  Past Medical History:  Diagnosis Date   Arthritis    Coronary artery disease    Hypertension    Past Surgical History:  Past Surgical History:  Procedure Laterality Date   CARDIAC CATHETERIZATION     TOTAL HIP ARTHROPLASTY Left 07/03/2018   Procedure: TOTAL HIP ARTHROPLASTY ANTERIOR APPROACH;  Surgeon: Rod Can, MD;  Location: [facility];  Service: Orthopedics;  Laterality: Left;   HPI:  Joel Peck is a 85 y.o. male with PMH significant for with PMH significant for HTN, arthritis, CAD who presents with L sided weakness, left facial droop and L hemianopsia. CTH with no ICH or large territory stroke. He was given [facility] and will be admitted to the ICU. MRI shows patchy acute ischemic nonhemorrhagic infarct involving the  posterior limb of the right internal capsule, right periatrial white  matter, and mesial right temporal lobe.    Assessment / Plan / Recommendation  Clinical Impression  Pt demonstrates mild left buccal and lingual weakness without significant residue today. Raised pt and family awareness of the potential for residue to cling in left buccal cavity and suggested liquid wash and lingual sweep. Pt With no signs of aspiration. Dentures will be available tomorrow. Initiate a regular diet and thin liquids. SLP will sign off for swallowing and f/u for cognitive linguistic eval in the coming days. SLP Visit Diagnosis: Dysphagia, unspecified (R13.10)    Aspiration Risk  Mild aspiration risk    Diet Recommendation Regular;Thin liquid   Liquid Administration via: Cup;Straw Medication Administration: Whole meds with liquid Supervision: Patient able to self feed Compensations: Slow  rate;Small sips/bites Postural Changes: Seated upright at 90 degrees    Other  Recommendations      Recommendations for follow up therapy are one component of a multi-disciplinary discharge planning process, led by the attending physician.  Recommendations may be updated based on patient status, additional functional criteria and insurance authorization.  Follow up Recommendations No SLP follow up      Assistance Recommended at Discharge    Functional Status Assessment    Frequency and Duration            Prognosis        Swallow Study   General HPI: Joel Peck is a 85 y.o. male with PMH significant for with PMH significant for HTN, arthritis, CAD who presents with L sided weakness, left facial droop and L hemianopsia. CTH with no ICH or large territory stroke. He was given [facility] and will be admitted to the ICU. MRI shows patchy acute ischemic nonhemorrhagic infarct involving the  posterior limb of the right internal capsule, right periatrial white  matter, and mesial right temporal lobe. Type of Study: Bedside Swallow Evaluation Previous Swallow Assessment: none Diet Prior to this Study: NPO Temperature Spikes Noted: No Respiratory Status: Room air History of Recent Intubation: No Behavior/Cognition: Alert;Cooperative;Pleasant mood Oral Cavity Assessment: Within Functional Limits Oral Care Completed by SLP: No Oral Cavity - Dentition: Edentulous;Dentures, not available Vision: Functional for self-feeding Self-Feeding Abilities: Able to feed self Patient Positioning: Upright in bed Baseline Vocal Quality: Normal Volitional Cough: Strong;Congested Volitional Swallow: Able to elicit    Oral/Motor/Sensory Function Overall Oral Motor/Sensory Function: Mild impairment Facial ROM: Reduced left;Suspected CN VII (  facial) dysfunction Facial Symmetry: Abnormal symmetry left;Suspected CN VII (facial) dysfunction Facial Strength: Reduced left;Suspected CN VII (facial)  dysfunction Facial Sensation: Within Functional Limits Lingual ROM: Reduced left;Suspected CN XII (hypoglossal) dysfunction Lingual Symmetry: Abnormal symmetry left;Suspected CN XII (hypoglossal) dysfunction Lingual Strength: Reduced Lingual Sensation: Within Functional Limits Velum: Within Functional Limits Mandible: Within Functional Limits   Ice Chips Ice chips: Not tested   Thin Liquid Thin Liquid: Within functional limits Presentation: Straw;Self Fed;Cup    Nectar Thick Nectar Thick Liquid: Not tested   Honey Thick Honey Thick Liquid: Not tested   Puree Puree: Within functional limits   Solid     Solid: Within functional limits      Jenilee Franey, Katherene Ponto 12/13/2020,12:48 PM

## 2020-12-13 NOTE — Progress Notes (Signed)
Inpatient Rehab Admissions Coordinator:   Per therapy recommendations,  patient was screened for CIR candidacy by Clemens Catholic, MS, CCC-SLP. At this time, Pt. Appears to be is a potential candidate for CIR. I will place  order for rehab consult per protocol for full assessment. Please contact me any with questions.  Clemens Catholic, [place], [place] Admissions Coordinator  [phone] ([place]) [phone] (office)

## 2020-12-13 NOTE — Plan of Care (Signed)
  Problem: Nutrition: Goal: Risk of aspiration will decrease Outcome: Progressing   Problem: Education: Goal: Knowledge of disease or condition will improve Outcome: Progressing

## 2020-12-13 NOTE — Evaluation (Signed)
Physical Therapy Evaluation Patient Details Name: Joel Peck MRN: [id number] DOB: [birth year] Today's Date: 12/13/2020  History of Present Illness  Pt is 85 yo male who presented on 12/12/20 with L facial droop, L weakness, and L hemianopsia. Pt found to have Patchy acute ischemic nonhemorrhagic infarct involving the posterior limb of the right internal capsule, right periatrial white  matter, and mesial right temporal lobe.  Pt was given tNKAase and admitted to ICU.  Pt with hx of HTN, arthritis, THA, and CAD.  Clinical Impression   Pt admitted with above diagnosis. At baseline, pt is independent and enjoys playing golf.  His daughter lives with him and he has 3 other children that can assist if daughter is working. Spoke with pt's RN prior to evaluation and bedrest has been lifted and pt ok to proceed with PT/OT eval.  Pt presenting with L sided weakness (UE worse than LE), L inattention, decreased L vision (see OT note), decreased cognition/problem solving, and decreased coordination.  Sensation was intact.  Pt had very high PLOF and does have some movement in UE and LE (reports improved since admission) with good family support.  Pt has excellent rehab potential - highly recommend acute inpatient rehab at d/c. Pt currently with functional limitations due to the deficits listed below (see PT Problem List). Pt will benefit from skilled PT to increase their independence and safety with mobility to allow discharge to the venue listed below.          Recommendations for follow up therapy are one component of a multi-disciplinary discharge planning process, led by the attending physician.  Recommendations may be updated based on patient status, additional functional criteria and insurance authorization.  Follow Up Recommendations Acute inpatient rehab (3hours/day)    Assistance Recommended at Discharge Frequent or constant Supervision/Assistance  Functional Status Assessment Patient has had a  recent decline in their functional status and demonstrates the ability to make significant improvements in function in a reasonable and predictable amount of time.  Equipment Recommendations  Other (comment) (further assessment post acute)    Recommendations for Other Services Rehab consult     Precautions / Restrictions Precautions Precautions: Fall      Mobility  Bed Mobility Overal bed mobility: Needs Assistance Bed Mobility: Rolling;Sidelying to Sit Rolling: Min assist Sidelying to sit: Mod assist;HOB elevated       General bed mobility comments: Pt requiring cues for technique, increased time, and mod A to lift trunk    Transfers Overall transfer level: Needs assistance Equipment used: 2 person hand held assist Transfers: Sit to/from Stand;Bed to chair/wheelchair/BSC Sit to Stand: Mod assist;+2 physical assistance   Step pivot transfers: Mod assist;+2 physical assistance       General transfer comment: Pt requiring mod A to rise with L knee blocked for safety; with steps to chair requiring cues for sequencing, assist to move L LE, L knee blocked, and increased support when WBing on L LE.    Ambulation/Gait                  Stairs            Wheelchair Mobility    Modified Rankin (Stroke Patients Only) Modified Rankin (Stroke Patients Only) Pre-Morbid Rankin Score: No symptoms Modified Rankin: Severe disability     Balance Overall balance assessment: Needs assistance Sitting-balance support: Single extremity supported;No upper extremity supported Sitting balance-Leahy Scale: Fair Sitting balance - Comments: Pt initially using R UE but able to progress to no  UE use with close supervision. Tends to look R and lean slightly L - could correct with cues     Standing balance-Leahy Scale: Poor Standing balance comment: Requiring mod A of 2                             Pertinent Vitals/Pain Pain Assessment: No/denies pain    Home  Living Family/patient expects to be discharged to:: Private residence Living Arrangements: Children (daughter lives with him) Available Help at Discharge: Family;Available 24 hours/day (Daughter works at night but has 3 other children who can assist) Type of Home: House Home Access: Other (comment) (threshold)       Home Layout: One level Home Equipment: Tub bench;Rolling Walker (2 wheels);Cane - single point;BSC/3in1      Prior Function Prior Level of Function : Independent/Modified Independent;Driving             Mobility Comments: Pt could ambulate in community; likes to play golf ADLs Comments: Independent with ADLs, IADLs,  finances, and medication     Hand Dominance        Extremity/Trunk Assessment   Upper Extremity Assessment Upper Extremity Assessment: Defer to OT evaluation (Pt with significant L UE deficits that will limit use of RW - defer to OT for details)    Lower Extremity Assessment Lower Extremity Assessment: LLE deficits/detail;RLE deficits/detail RLE Deficits / Details: ROM WFL; MMT 5/5 RLE Sensation: WNL LLE Deficits / Details: ROM WFL; MMT: grossly 3/5 throughout but with slow movements to achieve full ROM LLE Sensation: WNL LLE Coordination: decreased gross motor    Cervical / Trunk Assessment Cervical / Trunk Assessment: Other exceptions Cervical / Trunk Exceptions: L inattention - tending to keep head rotated R but could correct with cues  Communication   Communication: No difficulties  Cognition Arousal/Alertness: Awake/alert Behavior During Therapy: WFL for tasks assessed/performed Overall Cognitive Status: Impaired/Different from baseline Area of Impairment: Attention;Problem solving;Safety/judgement                   Current Attention Level: Selective     Safety/Judgement: Decreased awareness of deficits   Problem Solving: Difficulty sequencing General Comments: Pt alert and oriented x 4 and follow basic commands.  He  answered all questions appropriately and demonstrated appropriate affect/personality.  Does demonstrate L inattention and with higher level cognitive deficits (difficulty with clock drawing)        General Comments General comments (skin integrity, edema, etc.): VSS; family present and asking how they can assist: encouraged AROM/AAROM as able, ankle pumps with heel cord stretch on L , addressing pt on L side to assist with L inattention; agreeable to CIR    Exercises     Assessment/Plan    PT Assessment Patient needs continued PT services  PT Problem List Decreased strength;Decreased mobility;Decreased safety awareness;Decreased coordination;Decreased activity tolerance;Decreased cognition;Decreased balance;Decreased knowledge of use of DME       PT Treatment Interventions DME instruction;Therapeutic activities;Cognitive remediation;Gait training;Therapeutic exercise;Patient/family education;Balance training;Functional mobility training;Wheelchair mobility training    PT Goals (Current goals can be found in the Care Plan section)  Acute Rehab PT Goals Patient Stated Goal: agreeable to rehab PT Goal Formulation: With patient/family Time For Goal Achievement: 12/27/20 Potential to Achieve Goals: Good    Frequency Min 4X/week   Barriers to discharge        Co-evaluation PT/OT/SLP Co-Evaluation/Treatment: Yes Reason for Co-Treatment: Complexity of the patient's impairments (multi-system involvement);For patient/therapist safety PT goals addressed  during session: Mobility/safety with mobility;Balance OT goals addressed during session: ADL's and self-care;Strengthening/ROM       AM-PAC PT "6 Clicks" Mobility  Outcome Measure Help needed turning from your back to your side while in a flat bed without using bedrails?: A Little Help needed moving from lying on your back to sitting on the side of a flat bed without using bedrails?: A Lot Help needed moving to and from a bed to a  chair (including a wheelchair)?: Total Help needed standing up from a chair using your arms (e.g., wheelchair or bedside chair)?: Total Help needed to walk in hospital room?: Total Help needed climbing 3-5 steps with a railing? : Total 6 Click Score: 9    End of Session Equipment Utilized During Treatment: Gait belt Activity Tolerance: Patient tolerated treatment well Patient left: with chair alarm set;in chair;with call bell/phone within reach;with family/visitor present Nurse Communication: Mobility status PT Visit Diagnosis: Other abnormalities of gait and mobility (R26.89);Muscle weakness (generalized) (M62.81);Hemiplegia and hemiparesis Hemiplegia - Right/Left: Left Hemiplegia - dominant/non-dominant: Non-dominant Hemiplegia - caused by: Cerebral infarction    Time: 1227-1303 PT Time Calculation (min) (ACUTE ONLY): 36 min   Charges:   PT Evaluation $PT Eval Moderate Complexity: 1 Melina Schools, PT Acute Rehab Services Pager [phone] [facility] [place] 12/13/2020, 1:41 PM

## 2020-12-13 NOTE — Progress Notes (Signed)
OT Cancellation Note  Patient Details Name: Joel Peck MRN: [id number] DOB: 1935-10-13   Cancelled Treatment:    Reason Eval/Treat Not Completed: Active bedrest order. Will assess once activity orders updated.   Ramond Dial, OT/L   Acute OT Clinical Specialist Acute Rehabilitation Services Pager [phone] Office [phone]  12/13/2020, 8:20 AM

## 2020-12-13 NOTE — Progress Notes (Addendum)
STROKE TEAM PROGRESS NOTE   INTERVAL HISTORY His family is at the bedside.  Patient remains hemodynamically stable.  Patient's left side is still weak. Repeat head CT is scheduled for 1600.  LDL is 111 A1c 6.4 patient is to work with PT and OT.  Passed swallow evaluation.  PT and OT recommends acute inpatient rehab   Vitals:   12/13/20 0800 12/13/20 0900 12/13/20 1000 12/13/20 1200  BP: 135/65 (!) 145/60 140/62   Pulse: 95 88 85   Resp: 17 13 14    Temp: 98.2 F (36.8 C)   98.2 F (36.8 C)  TempSrc: Oral   Axillary  SpO2: 93% 97% 92%   Weight:      Height:       CBC:  Recent Labs  Lab 12/12/20 1745 12/12/20 1751  WBC 12.9*  --   NEUTROABS 8.7*  --   HGB 13.1 14.3  HCT 40.6 42.0  MCV 83.9  --   PLT 251  --    Basic Metabolic Panel:  Recent Labs  Lab 12/12/20 1745 12/12/20 1751  NA 138 141  K 4.0 4.0  CL 107 106  CO2 24  --   GLUCOSE 140* 142*  BUN 12 11  CREATININE 1.03 0.90  CALCIUM 8.9  --    Lipid Panel:  Recent Labs  Lab 12/13/20 0449  CHOL 165  TRIG 28  HDL 48  CHOLHDL 3.4  VLDL 6  LDLCALC 111*   HgbA1c:  Recent Labs  Lab 12/13/20 0449  HGBA1C 6.4*   Urine Drug Screen: No results for input(s): LABOPIA, COCAINSCRNUR, LABBENZ, AMPHETMU, THCU, LABBARB in the last 168 hours.  Alcohol Level No results for input(s): ETH in the last 168 hours.  IMAGING past 24 hours MR BRAIN WO CONTRAST  Result Date: 12/12/2020 CLINICAL DATA:  Initial evaluation for neuro deficit, stroke suspected. EXAM: MRI HEAD WITHOUT CONTRAST TECHNIQUE: Multiplanar, multiecho pulse sequences of the brain and surrounding structures were obtained without intravenous contrast. COMPARISON:  Prior CTs from earlier the same day. FINDINGS: Brain: Cerebral volume within normal limits for age. Mild scattered patchy T2/FLAIR hyperintensity involving the periventricular white matter, nonspecific, but likely related chronic microvascular ischemic disease, fairly minimal in nature in felt to  be within normal limits for age. Patchy restricted diffusion seen involving the posterior limb of the right internal capsule, with extension to involve the right periatrial white matter and mesial right temporal lobe, with involvement of the right hippocampus (series 5, images 73, 70, 68). No associated hemorrhage or mass effect. No other evidence for acute or subacute ischemia. Gray-white matter differentiation otherwise maintained with no other areas of chronic cortical infarction. No acute intracranial hemorrhage. Single punctate chronic microhemorrhage noted within the right basal ganglia, of doubtful significance in isolation. No mass lesion, midline shift or mass effect. No hydrocephalus or extra-axial fluid collection. Pituitary gland suprasellar region within normal limits. Midline structures intact. Vascular: Major intracranial vascular flow voids are maintained. Skull and upper cervical spine: Craniocervical junction within normal limits. Bone marrow signal intensity diffusely decreased on T1 weighted imaging, nonspecific, but most commonly related to anemia, smoking, obesity. No focal marrow replacing lesion. Small lipoma noted at the right frontal scalp. Sinuses/Orbits: Patient status post bilateral ocular lens replacement. Globes and orbital soft tissues demonstrate no acute finding. Paranasal sinuses are largely clear. No mastoid effusion. Inner ear structures grossly normal. Other: None. IMPRESSION: 1. Patchy acute ischemic nonhemorrhagic infarct involving the posterior limb of the right internal capsule, right periatrial white  matter, and mesial right temporal lobe. 2. Otherwise normal brain MRI for age. Electronically Signed   By: Jeannine Boga M.D.   On: 12/12/2020 23:37   CT HEAD CODE STROKE WO CONTRAST  Result Date: 12/12/2020 CLINICAL DATA:  Code stroke.  Slurred speech.  Left-sided weakness. EXAM: CT HEAD WITHOUT CONTRAST TECHNIQUE: Contiguous axial images were obtained from the  base of the skull through the vertex without intravenous contrast. COMPARISON:  CT head 12/11/2020 FINDINGS: Brain: No evidence of acute infarction, hemorrhage, hydrocephalus, extra-axial collection or mass lesion/mass effect. Vascular: Negative for hyperdense vessel Skull: Negative Sinuses/Orbits: Paranasal sinuses clear. Bilateral cataract extraction Other: None ASPECTS ([place] Stroke Program Early CT Score) - Ganglionic level infarction (caudate, lentiform nuclei, internal capsule, insula, M1-M3 cortex): 7 - Supraganglionic infarction (M4-M6 cortex): 3 Total score (0-10 with 10 being normal): 10 IMPRESSION: 1. No acute abnormality and no change from yesterday ASPECTS is 10 2. These results were called by telephone at the time of interpretation on 12/12/2020 at 5:59 pm to provider Lorrin Goodell, who verbally acknowledged these results. Electronically Signed   By: Franchot Gallo M.D.   On: 12/12/2020 18:00   CT ANGIO HEAD NECK W WO CM (CODE STROKE)  Result Date: 12/12/2020 CLINICAL DATA:  Acute neuro deficit. Left-sided deficit. Slurred speech. EXAM: CT ANGIOGRAPHY HEAD AND NECK TECHNIQUE: Multidetector CT imaging of the head and neck was performed using the standard protocol during bolus administration of intravenous contrast. Multiplanar CT image reconstructions and MIPs were obtained to evaluate the vascular anatomy. Carotid stenosis measurements (when applicable) are obtained utilizing NASCET criteria, using the distal internal carotid diameter as the denominator. CONTRAST:  79mL OMNIPAQUE IOHEXOL 350 MG/ML SOLN COMPARISON:  CT head 12/12/2020.  CT angio head neck 12/11/2020 FINDINGS: CTA NECK FINDINGS Aortic arch: Atherosclerotic calcification aortic arch and proximal great vessels. No significant stenosis. Right carotid system: Atherosclerotic calcification right carotid bifurcation. 55% diameter stenosis distal common carotid artery. Approximately 50% diameter stenosis proximal right internal carotid  artery. Stenosis at the origin of the right external carotid artery Left carotid system: Atherosclerotic calcification left carotid bifurcation. 30% diameter stenosis distal left common carotid artery mild stenosis proximal left internal carotid artery. Vertebral arteries: Both vertebral arteries patent to the skull base without significant stenosis. Skeleton: Cervical spondylosis.  No acute abnormality. Other neck: Negative Upper chest: Lung apices clear bilaterally. Review of the MIP images confirms the above findings CTA HEAD FINDINGS Anterior circulation: Atherosclerotic calcification in the cavernous carotid bilaterally with mild to moderate stenosis bilaterally. Anterior and middle cerebral arteries patent bilaterally without stenosis or large vessel occlusion. Posterior circulation: Both vertebral arteries patent to the basilar. Mild stenosis distal vertebral artery bilaterally. Basilar widely patent. Superior cerebellar arteries patent bilaterally. Posterior cerebral arteries patent with mild proximal stenosis bilaterally. Negative for large vessel occlusion. Venous sinuses: Normal venous enhancement. Anatomic variants: None Review of the MIP images confirms the above findings IMPRESSION: 1. Negative for intracranial large vessel occlusion. 2. Mild PCA stenosis bilaterally. 3. Mild to moderate stenosis in the cavernous carotid bilaterally 4. 55% diameter stenosis distal common carotid artery on the right with 50% diameter stenosis proximal right internal carotid artery. 5. 30% diameter stenosis distal left common carotid artery and mild stenosis proximal left internal carotid artery 6. Both vertebral arteries patent with mild stenosis distally. 7. These results were called by telephone at the time of interpretation on 12/12/2020 at 6:15 pm to provider [facility] , who verbally acknowledged these results. Electronically Signed   By: Juanda Crumble  Carlis Abbott M.D.   On: 12/12/2020 18:28    PHYSICAL EXAM  Temp:   [97.6 F (36.4 C)-98.7 F (37.1 C)] 98.2 F (36.8 C) (12/12 1200) Pulse Rate:  [63-102] 85 (12/12 1000) Resp:  [0-27] 14 (12/12 1000) BP: (89-150)/(40-76) 140/62 (12/12 1000) SpO2:  [80 %-100 %] 92 % (12/12 1000) Weight:  [77 kg] 77 kg (12/11 1839)  General - Well nourished, well developed, in no apparent distress. Ophthalmologic - fundi not visualized due to noncooperation. Cardiovascular - Regular rhythm and rate. Mental Status -  Level of arousal and orientation to time, place, and person were intact. Language including expression, naming, repetition, comprehension was assessed and found intact. Attention span and concentration were normal. Recent and remote memory were intact. Fund of Knowledge was assessed and was intact.  Cranial Nerves II - XII - II -left upper visual field deficit III, IV, VI - Extraocular movements intact. V - Facial sensation intact bilaterally. VII -facial symmetry intact VIII - Hearing & vestibular intact bilaterally. X - Palate elevates symmetrically. XI - Chin turning & shoulder shrug intact bilaterally. XII - Tongue protrusion intact.  Motor Strength -  Bulk was normal and fasciculations were absent.   Left upper extremity strength 3/5 with some effort against gravity Left lower extremity strength 3/5 with drift present Bilateral right extremities 5/5 Motor Tone - Muscle tone was assessed at the neck and appendages and was normal.  Reflexes - The patient's reflexes were symmetrical in all extremities and he had no pathological reflexes.  Sensory - Light touch, temperature/pinprick were assessed and were symmetrical.    Coordination -no ataxia noted.  Patient has diminished fine motor control and decreased coordination in the upper extremities  Gait and Station - deferred.  ASSESSMENT/PLAN Mr. KADIN CANIPE is a 85 y.o. male with a past medical history significant for hypertension arthritis CAD who presents with left-sided weakness left  facial droop and left hemianopsia.  Patient was seen at [facility] 12/10 for a 1 hour episode of right leg weakness. He was discharged 12/11 around 1330. Around 1700 he had a sudden onset of left-sided weakness.  EMS was called and he was brought to the emergency department.  Family states that right leg weakness had completely resolved.  When he was discharged from [facility] and he had no symptoms and he was discharged on Plavix.Marland Kitchen   He received TNKase at 1753  Stroke: scattered infarcts at right MCA/PCA border zone and right AchA territory s/p TNK , likely secondary to large vessel source from right ICA high grade stenosis.  Code Stroke CT head No acute abnormality. ASPECTS 10.    CTA head & neck-negative for LVO, 55% stenosis distal CCA on the right with 50% diameter stenosis proximal right ICA.  MRI-patchy acute ischemic nonhemorrhagic infarct involving the posterior limb of the right internal capsule right periatrial white matter and mesial right temporal lobe 2D Echo pending LDL 111 HgbA1c 6.4 VTE prophylaxis - SCDs No anticoagulation prior to admission, now on aspirin 81 mg daily and clopidogrel 75 mg daily.  Patient was prescribed Plavix 75 mg at the previous hospital but had not taken it yet Therapy recommendations: Acute inpatient rehab Disposition: Pending  Carotid stenosis Pt has been following in [place] for b/l carotid stenosis CTA head & neck 55% stenosis distal CCA on the right with 50% diameter stenosis proximal right ICA. Left ICA 30% stenosis but prominent atherosclerosis with soft plaques Current stroke likely related to right ICA stenosis Will consult  VVS in am  Hx stroke/TIA discharged 12/10 from [facility] after a transient episode of right lower extremity weakness.  Patient was discharged on Plavix and aspirin.  Hypertension Home meds: Hydralazine 25 mg, Lopressor 25 mg, amlodipine 2.5 mg Stable BP goal < 180/105 within 24h of  TNK Long-term BP goal normotensive  Hyperlipidemia Home meds: Rosuvastatin 20 mg, resumed in hospital LDL 111, goal < 70 increase rosuvastatin to 40 mg High intensity statin Continue statin at discharge  Other Stroke Risk Factors Advanced Age >/= 69  Coronary artery disease Quit smoking 40 years ago  Other Active Problems BPH Flomax 0.4 mg GERD Omeprazole 20 mg   Hospital day # 1  Patient seen and examined by NP/APP with MD. MD to update note as needed.   Janine Ores, DNP, FNP-BC [facility] Pager: [phone]   ATTENDING NOTE: I reviewed above note and agree with the assessment and plan. Pt was seen and examined.   85 year old male with history of hypertension, CAD, carotid stenosis bilaterally admitted for slurred speech and left-sided facial droop and left-sided weakness and left hemianopia.  Per note, he was seen at [facility] 12/10 for 1 hour episode of right leg weakness.  He was approved aspirin and Plavix, however not started yet.  CT no acute abnormality.  Status post TN K.  CTA head and neck showed bilateral ICA bulb, 55% stenosis distal right CCA and 50% stenosis proximal right ICA atherosclerosis, 30% left CCA/ICA stenosis however with prominent soft plaques.  MRI showed right medial temporal lobe (MCA/PCA) and right optic radiation and PLIC (AchA) infarcts.  2D echo pending, LDL 111, A1c 6.4.  Creatinine 0.9, WBC 12.9.  At 24 hours of TNK, CT repeat no hemorrhage.  On exam, patient daughter and son at bedside, patient awake alert, orientated x3, no aphasia, mild dysarthria, follows some commands.  Name repeat.  Visual field full at right, patchy visual field deficit on the left more at left upper quandrant.  Facial symmetrical, tongue midline.  Left upper 3/5 and lower extremity 3+/5.  Sensation symmetrical, no ataxia on the right.   Etiology for patient stroke at MCA/PCA and AchA likely due to right ICA high-grade stenosis/tandem stenosis.   We will start DAPT and statin.  We will consult vascular surgery in a.m. for possible right carotid revascularization.  Per report, patient had transient right leg weakness before current event, not sure if related to left ICA atherosclerosis.  May need follow-up with vascular surgery as outpatient.  PT/OT recommend CIR.  For detailed assessment and plan, please refer to above as I have made changes wherever appropriate.   Rosalin Hawking, MD PhD Stroke Neurology 12/13/2020 6:54 PM  This patient is critically ill due to right MCA/PCA and AchA infarcts, bilateral carotid stenosis, status post TN K and at significant risk of neurological worsening, death form recurrent stroke, hemorrhagic conversion, hemorrhage from TN K. This patient's care requires constant monitoring of vital signs, hemodynamics, respiratory and cardiac monitoring, review of multiple databases, neurological assessment, discussion with family, other specialists and medical decision making of high complexity. I spent 45 minutes of neurocritical care time in the care of this patient. I had long discussion with daughter and son at bedside, updated pt current condition, treatment plan and potential prognosis, and answered all the questions.  They expressed understanding and appreciation.      To contact Stroke Continuity provider, please refer to [email or web address] After hours, contact General Neurology

## 2020-12-13 NOTE — Progress Notes (Signed)
  Transition of Care [facility]) Screening Note   Patient Details  Name: Joel Peck Date of Birth: Apr 14, 1935   Transition of Care [facility]) CM/SW Contact:    Ella Bodo, RN Phone Number: 12/13/2020, 1:35 PM    Transition of Care Department [facility]) has reviewed patient and no TOC needs have been identified at this time. We will continue to monitor patient advancement through interdisciplinary progression rounds. If new patient transition needs arise, please place a TOC consult.    Reinaldo Raddle, RN, BSN  Trauma/Neuro ICU Case Manager [phone]

## 2020-12-14 ENCOUNTER — Other Ambulatory Visit (HOSPITAL_COMMUNITY): Payer: PPO

## 2020-12-14 ENCOUNTER — Encounter (HOSPITAL_COMMUNITY): Payer: PPO

## 2020-12-14 ENCOUNTER — Inpatient Hospital Stay (HOSPITAL_COMMUNITY): Payer: PPO

## 2020-12-14 DIAGNOSIS — Z87891 Personal history of nicotine dependence: Secondary | ICD-10-CM

## 2020-12-14 DIAGNOSIS — I2510 Atherosclerotic heart disease of native coronary artery without angina pectoris: Secondary | ICD-10-CM

## 2020-12-14 DIAGNOSIS — Z9861 Coronary angioplasty status: Secondary | ICD-10-CM

## 2020-12-14 DIAGNOSIS — I6521 Occlusion and stenosis of right carotid artery: Secondary | ICD-10-CM

## 2020-12-14 DIAGNOSIS — Z8673 Personal history of transient ischemic attack (TIA), and cerebral infarction without residual deficits: Secondary | ICD-10-CM

## 2020-12-14 DIAGNOSIS — E7800 Pure hypercholesterolemia, unspecified: Secondary | ICD-10-CM

## 2020-12-14 DIAGNOSIS — I10 Essential (primary) hypertension: Secondary | ICD-10-CM

## 2020-12-14 DIAGNOSIS — Z7902 Long term (current) use of antithrombotics/antiplatelets: Secondary | ICD-10-CM

## 2020-12-14 DIAGNOSIS — Z01810 Encounter for preprocedural cardiovascular examination: Secondary | ICD-10-CM | POA: Diagnosis not present

## 2020-12-14 DIAGNOSIS — G8194 Hemiplegia, unspecified affecting left nondominant side: Secondary | ICD-10-CM

## 2020-12-14 DIAGNOSIS — Z7982 Long term (current) use of aspirin: Secondary | ICD-10-CM

## 2020-12-14 DIAGNOSIS — Z79899 Other long term (current) drug therapy: Secondary | ICD-10-CM

## 2020-12-14 LAB — BASIC METABOLIC PANEL
Anion gap: 8 (ref 5–15)
BUN: 11 mg/dL (ref 8–23)
CO2: 24 mmol/L (ref 22–32)
Calcium: 8.9 mg/dL (ref 8.9–10.3)
Chloride: 107 mmol/L (ref 98–111)
Creatinine, Ser: 1 mg/dL (ref 0.61–1.24)
GFR, Estimated: >60 mL/min (ref >60)
Glucose, Bld: 102 mg/dL — ABNORMAL HIGH (ref 70–99)
Potassium: 3.5 mmol/L (ref 3.5–5.1)
Sodium: 139 mmol/L (ref 135–145)

## 2020-12-14 LAB — CBC
HCT: 38.3 % — ABNORMAL LOW (ref 39.0–52.0)
Hemoglobin: 12.6 g/dL — ABNORMAL LOW (ref 13.0–17.0)
MCH: 26.9 pg (ref 26.0–34.0)
MCHC: 32.9 g/dL (ref 30.0–36.0)
MCV: 81.8 fL (ref 80.0–100.0)
Platelets: 229 10*3/uL (ref 150–400)
RBC: 4.68 MIL/uL (ref 4.22–5.81)
RDW: 13.9 % (ref 11.5–15.5)
WBC: 11.1 10*3/uL — ABNORMAL HIGH (ref 4.0–10.5)
nRBC: 0 % (ref 0.0–0.2)

## 2020-12-14 MED ORDER — LABETALOL HCL 5 MG/ML IV SOLN
5.0000 mg | INTRAVENOUS | Status: DC | PRN
  Administered 2020-12-14: 5 mg via INTRAVENOUS
  Filled 2020-12-14: qty 4

## 2020-12-14 MED ORDER — METOPROLOL TARTRATE 25 MG PO TABS
25.0000 mg | ORAL_TABLET | Freq: Two times a day (BID) | ORAL | Status: DC
  Administered 2020-12-14 – 2020-12-21 (×15): 25 mg via ORAL
  Filled 2020-12-14 (×15): qty 1

## 2020-12-14 MED ORDER — AMLODIPINE BESYLATE 5 MG PO TABS
5.0000 mg | ORAL_TABLET | Freq: Every day | ORAL | Status: DC
  Administered 2020-12-14 – 2020-12-15 (×2): 5 mg via ORAL
  Filled 2020-12-14 (×2): qty 1

## 2020-12-14 NOTE — Consult Note (Addendum)
Cardiology Consultation:   Patient ID: ROWAN BLAKER MRN: [id number]; DOB: 12/22/1935  Admit date: 12/12/2020 Date of Consult: 12/14/2020  PCP:  Ronita Hipps, MD   [facility] Providers Cardiologist:  Dr. Geraldo Pitter >>Brandy Belinda Block, NP  Patient Profile:   KATAI MARSICO is a 85 y.o. male with a hx of CAD, HTN, HLD and carotid artery disease who is being seen 12/14/2020 for the evaluation of pre op clearance  at the request of Dr. .  Hx of CAD s/p stenting to RCA @ [facility] in 2014. He was followed by Dr. Salley Scarlet until 03/2016 then Kaleen Odea, NP.  Patient with know hx of carotid artery dz and being followed by Vascular @ [facility]. Last doppler 03/2020 showed stable moderate bilateral stenosis. Recommended continued surveillance.   Echo 01/2019 with LVEF of 55-60%. Mild MR. There is aortic sclerosis noted, with no evidence of stenosis.  History of Present Illness:   Mr. Swayne  presented 12/12/2020 for code stroke. She was admitted at [facility] 12/10-12/11 for R leg weakness. This was resolved prior to discharge around 1330 on 12/11. However had sudden onset L sided weakness at 1700. EMS was activated and presented to [facility] with code stroke. Work up showed scattered infract at [employer]. She received tNKAase. Seen by vascular due to carotid artery stenosis and plan for right carotid enterectomy. Cardiology is asked for surgical clearance.    No chest pain or dyspnea.    Past Medical History:  Diagnosis Date   Arthritis    Coronary artery disease    Hypertension     Past Surgical History:  Procedure Laterality Date   CARDIAC CATHETERIZATION     TOTAL HIP ARTHROPLASTY Left 07/03/2018   Procedure: TOTAL HIP ARTHROPLASTY ANTERIOR APPROACH;  Surgeon: Rod Can, MD;  Location: [facility];  Service: Orthopedics;  Laterality: Left;     Inpatient Medications: Scheduled Meds:   stroke: mapping our early stages of recovery book    Does not apply Once   amLODipine  5 mg Oral Daily   aspirin EC  325 mg Oral Daily   Chlorhexidine Gluconate Cloth  6 each Topical Daily   clopidogrel  75 mg Oral Daily   docusate sodium  100 mg Oral BID   enoxaparin (LOVENOX) injection  40 mg Subcutaneous Q24H   mouth rinse  15 mL Mouth Rinse BID   metoprolol tartrate  25 mg Oral BID   pantoprazole  40 mg Oral Daily   rosuvastatin  40 mg Oral QHS   tamsulosin  0.4 mg Oral Daily   Continuous Infusions:  PRN Meds: acetaminophen **OR** acetaminophen (TYLENOL) oral liquid 160 mg/5 mL **OR** acetaminophen, labetalol, senna-docusate  Allergies:   No Known Allergies  Social History:   Social History   Socioeconomic History   Marital status: Married    Spouse name: Not on file   Number of children: Not on file   Years of education: Not on file   Highest education level: Not on file  Occupational History   Not on file  Tobacco Use   Smoking status: Former   Smokeless tobacco: Never   Tobacco comments:    Quit smoking 40 years ago  Vaping Use   Vaping Use: Never used  Substance and Sexual Activity   Alcohol use: No   Drug use: No   Sexual activity: Not on file  Other Topics Concern   Not on file  Social History Narrative   Not on  file   Social Determinants of Health   Financial Resource Strain: Not on file  Food Insecurity: Not on file  Transportation Needs: Not on file  Physical Activity: Not on file  Stress: Not on file  Social Connections: Not on file  Intimate Partner Violence: Not on file    Family History:   History reviewed. No pertinent family history.  No family  hx of CAD ROS:  Please see the history of present illness.   All other ROS reviewed and negative.     Physical Exam/Data:   Vitals:   12/14/20 0700 12/14/20 0800 12/14/20 0900 12/14/20 1200  BP: (!) 181/81 (!) 163/80 (!) 188/77   Pulse: 86 86 (!) 101 88  Resp: 18 18 20 20   Temp:  98.2 F (36.8 C)  98.6 F (37 C)  TempSrc:  Oral  Oral   SpO2: 97% 95% 96% 94%  Weight:      Height:        Intake/Output Summary (Last 24 hours) at 12/14/2020 1336 Last data filed at 12/14/2020 1300 Gross per 24 hour  Intake 1188.42 ml  Output 1280 ml  Net -91.58 ml   Last 3 Weights 12/12/2020 12/12/2020 07/03/2018  Weight (lbs) 169 lb 12.1 oz 169 lb 12.1 oz 166 lb 6 oz  Weight (kg) 77 kg 77 kg 75.467 kg     Body mass index is 26.59 kg/m.  General:  Well nourished, well developed, in no acute distress HEENT: normal Neck: no JVD Vascular: bilateral carotid bruits; Distal pulses 2+ bilaterally Cardiac:  normal S1, S2; RRR; no murmur  Lungs:  clear to auscultation bilaterally, no wheezing, rhonchi or rales  Abd: soft, nontender, no hepatomegaly  Ext: no edema Musculoskeletal:  No deformities, L sided weakness  Skin: warm and dry  Neuro:  CNs 2-12 intact, no focal abnormalities noted Psych:  Normal affect   EKG:  The EKG was personally reviewed and demonstrates:  SR, TW in lead V5-V6 Telemetry:  Telemetry was personally reviewed and demonstrates:  sinus rhythm/tachycardia  Relevant CV Studies:  Echo 01/2019 Conclusions  Summary  Normal left ventricular size and systolic function with no appreciable  segmental abnormality.  Ejection fraction is visually estimated at 22-48%  Diastolic function Appears normal  Mild mitral regurgitation.  There is aortic sclerosis noted, with no evidence of stenosis.  Trace aortic regurgitation.  No old study for comparison  Signature  ------------------------------------------------------------------------------   Electronically signed by Abran Richard, MD, FACC(Interpreting physician) on   01/17/2019 02:32 PM  ------------------------------------------------------------------------------  Findings  Mitral Valve  Structurally normal mitral valve.  Mild mitral regurgitation.  Aortic Valve  There is aortic sclerosis noted, with no evidence of stenosis.  Trace aortic regurgitation.  Tricuspid  Valve  Tricuspid valve is structurally normal.  Mild tricuspid regurgitation.  RVSP 35 mm Hg.  Pulmonic Valve  Pulmonic valve is structurally normal.  Moderate pulmonic regurgitation.  Left Atrium  Left atrial volume index of 23.5 ml per meters squared BSA.  Normal size left atrium.  Left Ventricle  Normal left ventricular size and systolic function with no appreciable  segmental abnormality.  Ejection fraction is visually estimated at 25-00%  Diastolic function Appears normal  Right Atrium  Normal right atrium.  Right Ventricle  Normal right ventricle structure and function.  Pericardial Effusion  No evidence of pericardial effusion.  Pleural Effusion  No evidence of pleural effusion.  Miscellaneous  The aorta is within normal limits.  The Pulmonary artery is within normal limits.  Intact interatrial septum with no obvious shunt by color doppler.  IVC is normal and collapses  M-Mode/2D Measurements & Calculations   LV Diastolic Dimension:  LV Systolic Dimension:   LA Dimension: 2.9 cmAO   3.8 cm                   2.4 cm                   Root Dimension: 3.2 cmLA   LV FS:36.84 %            LV Volume Diastolic:     Area: 24.2 cm2   LV PW Diastolic: 1 cm    35.3 ml   Septum Diastolic: 1.1 cm LV Volume Systolic: 61.4                            ml                            LV EDV/LV EDV Index:                            80.7 ml/43 m2LV ESV/LV   LA/Aorta: 0.91   LV Area Diastolic: 43.1  ESV Index: 33.7 ml/18 m2   cm2                      EF Calculated: 58.24 %   LA volume/Index: 51.9 ml   LV Area Systolic: 54.0   EF Estimated: 60 %       /86m2   cm2                      LV Length: 7.13 cm  Doppler Measurements & Calculations   MV Peak E-Wave: 81.5     AV Peak Velocity: 138  LVOT Peak Velocity: 95.3   cm/s                     cm/s                   cm/s   MV Peak A-Wave: 79.1     AV Peak Gradient: 7.62 LVOT Mean Velocity: 61.5   cm/s                     mmHg                    cm/s   MV E/A Ratio: 1.03       AV Mean Velocity: 88.2 LVOT Peak Gradient: 4   MV Peak Gradient: 2.66   cm/s                   mmHgLVOT Mean Gradient: 2   mmHg                     AV Mean Gradient: 3    mmHg                            mmHg   MV Deceleration Time:    AV VTI: 30 cm   204 msec   MV P1/2t: 60 msec        LVOT VTI: 22.7 cm      TR Velocity:276 cm/s   MVA  by PHT3.67 cm2                              TR Gradient:30.4704 mmHg                                                   PV Peak Velocity: 50.4 cm/s   TDI E' Velocity: 8.27                           PV Peak Gradient: 1.02 mmHg   cm/s                                                   PR ED Velocity: 114 cm/s  Laboratory Data: Chemistry Recent Labs  Lab 12/12/20 1745 12/12/20 1751 12/14/20 0701  NA 138 141 139  K 4.0 4.0 3.5  CL 107 106 107  CO2 24  --  24  GLUCOSE 140* 142* 102*  BUN 12 11 11   CREATININE 1.03 0.90 1.00  CALCIUM 8.9  --  8.9  GFRNONAA >60  --  >60  ANIONGAP 7  --  8    Recent Labs  Lab 12/12/20 1745  PROT 6.3*  ALBUMIN 3.6  AST 37  ALT 14  ALKPHOS 57  BILITOT 0.6   Lipids  Recent Labs  Lab 12/13/20 0449  CHOL 165  TRIG 28  HDL 48  LDLCALC 111*  CHOLHDL 3.4    Hematology Recent Labs  Lab 12/12/20 1745 12/12/20 1751 12/14/20 0701  WBC 12.9*  --  11.1*  RBC 4.84  --  4.68  HGB 13.1 14.3 12.6*  HCT 40.6 42.0 38.3*  MCV 83.9  --  81.8  MCH 27.1  --  26.9  MCHC 32.3  --  32.9  RDW 13.7  --  13.9  PLT 251  --  229   Radiology/Studies:  CT HEAD WO CONTRAST (5MM)  Result Date: 12/13/2020 CLINICAL DATA:  Follow-up stroke in the right hemisphere. EXAM: CT HEAD WITHOUT CONTRAST TECHNIQUE: Contiguous axial images were obtained from the base of the skull through the vertex without intravenous contrast. COMPARISON:  CT and MRI studies done yesterday. FINDINGS: Brain: No abnormality is seen affecting the brainstem or cerebellum. Subtle low density is now visible at the site of acute  infarction in the mesial right temporal lobe, hippocampus and inferior basal ganglia. No sign of new ischemic insult. The brain otherwise shows age related volume loss with mild chronic small-vessel change of the white matter. No mass, hemorrhage, hydrocephalus or extra-axial collection. Vascular: There is atherosclerotic calcification of the major vessels at the base of the brain. Skull: Negative Sinuses/Orbits: Clear except for chronic inflammatory change of the diminutive left division of the sphenoid sinus. Orbits negative. Other: None IMPRESSION: Development of subtle low density in the mesial temporal lobe, hippocampus and inferior basal ganglia on the right at the site of acute infarction shown by MRI. No evidence of mass effect or hemorrhage. No new ischemic insult. Electronically Signed   By: Nelson Chimes M.D.   On: 12/13/2020 17:31   MR BRAIN WO CONTRAST  Result Date: 12/12/2020  CLINICAL DATA:  Initial evaluation for neuro deficit, stroke suspected. EXAM: MRI HEAD WITHOUT CONTRAST TECHNIQUE: Multiplanar, multiecho pulse sequences of the brain and surrounding structures were obtained without intravenous contrast. COMPARISON:  Prior CTs from earlier the same day. FINDINGS: Brain: Cerebral volume within normal limits for age. Mild scattered patchy T2/FLAIR hyperintensity involving the periventricular white matter, nonspecific, but likely related chronic microvascular ischemic disease, fairly minimal in nature in felt to be within normal limits for age. Patchy restricted diffusion seen involving the posterior limb of the right internal capsule, with extension to involve the right periatrial white matter and mesial right temporal lobe, with involvement of the right hippocampus (series 5, images 73, 70, 68). No associated hemorrhage or mass effect. No other evidence for acute or subacute ischemia. Gray-white matter differentiation otherwise maintained with no other areas of chronic cortical infarction. No  acute intracranial hemorrhage. Single punctate chronic microhemorrhage noted within the right basal ganglia, of doubtful significance in isolation. No mass lesion, midline shift or mass effect. No hydrocephalus or extra-axial fluid collection. Pituitary gland suprasellar region within normal limits. Midline structures intact. Vascular: Major intracranial vascular flow voids are maintained. Skull and upper cervical spine: Craniocervical junction within normal limits. Bone marrow signal intensity diffusely decreased on T1 weighted imaging, nonspecific, but most commonly related to anemia, smoking, obesity. No focal marrow replacing lesion. Small lipoma noted at the right frontal scalp. Sinuses/Orbits: Patient status post bilateral ocular lens replacement. Globes and orbital soft tissues demonstrate no acute finding. Paranasal sinuses are largely clear. No mastoid effusion. Inner ear structures grossly normal. Other: None. IMPRESSION: 1. Patchy acute ischemic nonhemorrhagic infarct involving the posterior limb of the right internal capsule, right periatrial white matter, and mesial right temporal lobe. 2. Otherwise normal brain MRI for age. Electronically Signed   By: Jeannine Boga M.D.   On: 12/12/2020 23:37   CT HEAD CODE STROKE WO CONTRAST  Result Date: 12/12/2020 CLINICAL DATA:  Code stroke.  Slurred speech.  Left-sided weakness. EXAM: CT HEAD WITHOUT CONTRAST TECHNIQUE: Contiguous axial images were obtained from the base of the skull through the vertex without intravenous contrast. COMPARISON:  CT head 12/11/2020 FINDINGS: Brain: No evidence of acute infarction, hemorrhage, hydrocephalus, extra-axial collection or mass lesion/mass effect. Vascular: Negative for hyperdense vessel Skull: Negative Sinuses/Orbits: Paranasal sinuses clear. Bilateral cataract extraction Other: None ASPECTS ([place] Stroke Program Early CT Score) - Ganglionic level infarction (caudate, lentiform nuclei, internal capsule,  insula, M1-M3 cortex): 7 - Supraganglionic infarction (M4-M6 cortex): 3 Total score (0-10 with 10 being normal): 10 IMPRESSION: 1. No acute abnormality and no change from yesterday ASPECTS is 10 2. These results were called by telephone at the time of interpretation on 12/12/2020 at 5:59 pm to provider Lorrin Goodell, who verbally acknowledged these results. Electronically Signed   By: Franchot Gallo M.D.   On: 12/12/2020 18:00   CT ANGIO HEAD NECK W WO CM (CODE STROKE)  Result Date: 12/12/2020 CLINICAL DATA:  Acute neuro deficit. Left-sided deficit. Slurred speech. EXAM: CT ANGIOGRAPHY HEAD AND NECK TECHNIQUE: Multidetector CT imaging of the head and neck was performed using the standard protocol during bolus administration of intravenous contrast. Multiplanar CT image reconstructions and MIPs were obtained to evaluate the vascular anatomy. Carotid stenosis measurements (when applicable) are obtained utilizing NASCET criteria, using the distal internal carotid diameter as the denominator. CONTRAST:  40mL OMNIPAQUE IOHEXOL 350 MG/ML SOLN COMPARISON:  CT head 12/12/2020.  CT angio head neck 12/11/2020 FINDINGS: CTA NECK FINDINGS Aortic arch: Atherosclerotic calcification aortic arch  and proximal great vessels. No significant stenosis. Right carotid system: Atherosclerotic calcification right carotid bifurcation. 55% diameter stenosis distal common carotid artery. Approximately 50% diameter stenosis proximal right internal carotid artery. Stenosis at the origin of the right external carotid artery Left carotid system: Atherosclerotic calcification left carotid bifurcation. 30% diameter stenosis distal left common carotid artery mild stenosis proximal left internal carotid artery. Vertebral arteries: Both vertebral arteries patent to the skull base without significant stenosis. Skeleton: Cervical spondylosis.  No acute abnormality. Other neck: Negative Upper chest: Lung apices clear bilaterally. Review of the MIP  images confirms the above findings CTA HEAD FINDINGS Anterior circulation: Atherosclerotic calcification in the cavernous carotid bilaterally with mild to moderate stenosis bilaterally. Anterior and middle cerebral arteries patent bilaterally without stenosis or large vessel occlusion. Posterior circulation: Both vertebral arteries patent to the basilar. Mild stenosis distal vertebral artery bilaterally. Basilar widely patent. Superior cerebellar arteries patent bilaterally. Posterior cerebral arteries patent with mild proximal stenosis bilaterally. Negative for large vessel occlusion. Venous sinuses: Normal venous enhancement. Anatomic variants: None Review of the MIP images confirms the above findings IMPRESSION: 1. Negative for intracranial large vessel occlusion. 2. Mild PCA stenosis bilaterally. 3. Mild to moderate stenosis in the cavernous carotid bilaterally 4. 55% diameter stenosis distal common carotid artery on the right with 50% diameter stenosis proximal right internal carotid artery. 5. 30% diameter stenosis distal left common carotid artery and mild stenosis proximal left internal carotid artery 6. Both vertebral arteries patent with mild stenosis distally. 7. These results were called by telephone at the time of interpretation on 12/12/2020 at 6:15 pm to provider [facility] , who verbally acknowledged these results. Electronically Signed   By: Franchot Gallo M.D.   On: 12/12/2020 18:28     Assessment and Plan:   Surgical clearance - Seem stable. No angina or heart failure symptoms.   2. CAD s/p remote stenting - no angina - now on ASA and Plavix due to stroke - Continue statin  3. HTN - permissive HTN - On Amlodipine and BB  For questions or updates, please contact [place] Please consult [email or web address] for contact info under    Jarrett Soho, [place]  12/14/2020 1:36 PM    Patient examined chart reviewed Discussed care with PA, patient and daughter. Exam  with black male in no distress. Partial left facial droop and LUE >LLE weakness from recent stroke Bilateral carotid bruits No murmurs lungs clear abdomen benign palpable PT/DP bilaterally . Distant history of RCA stent 2014 single vessel dx with normal EF Normal stress test 2 years ago Being followed in [place].  No angina and active playing lots of golf before stroke Ok to proceed with CEA with Dr Scot Dock should he continue to have functional recovery from his stroke   Jenkins Rouge MD [facility]

## 2020-12-14 NOTE — Progress Notes (Signed)
Physical Therapy Treatment Patient Details Name: Joel Peck MRN: [id number] DOB: 06/02/1935 Today's Date: 12/14/2020   History of Present Illness Pt is 85 yo male who presented on 12/12/20 with L facial droop, L weakness, and L hemianopsia. Pt found to have Patchy acute ischemic nonhemorrhagic infarct involving the posterior limb of the right internal capsule, right periatrial white  matter, and mesial right temporal lobe.  Pt was given tNKAase and admitted to ICU.  Pt with hx of HTN, arthritis, THA, and CAD.    PT Comments    Pt eager to mobilize and strongly desires to get back to golfing. Pt became tearful at end of session regarding his current lack of function compared to his "normal". Pt more aware of L side today with prompting. Focused on EOB balance and maintaining midline, pt aware of leaning to the L but required tactile cues to correct. Contiues to require modAx2 for std pvt transfer to chair, pt unable to sequence steps at this time. Continue to recommend CIR upon d/c for maximal functional recovery. Acute PT to cont to follow.    Recommendations for follow up therapy are one component of a multi-disciplinary discharge planning process, led by the attending physician.  Recommendations may be updated based on patient status, additional functional criteria and insurance authorization.  Follow Up Recommendations  Acute inpatient rehab (3hours/day)     Assistance Recommended at Discharge Frequent or constant Supervision/Assistance  Equipment Recommendations  Other (comment) (further assessment post acute)    Recommendations for Other Services Rehab consult     Precautions / Restrictions Precautions Precautions: Fall Precaution Comments: L in attention Restrictions Weight Bearing Restrictions: No     Mobility  Bed Mobility Overal bed mobility: Needs Assistance Bed Mobility: Rolling;Sidelying to Sit Rolling: Min assist Sidelying to sit: Mod assist;HOB elevated        General bed mobility comments: Pt requiring cues for technique, increased time, and mod A to lift trunk    Transfers Overall transfer level: Needs assistance Equipment used: 2 person hand held assist (face to face with gait belt) Transfers: Sit to/from Stand;Bed to chair/wheelchair/BSC Sit to Stand: Mod assist;+2 physical assistance     Step pivot transfers: Mod assist;+2 physical assistance     General transfer comment: Pt requiring mod A to rise with L knee blocked for safety; with steps to chair requiring cues for sequencing, assist to move L LE, L knee blocked, and increased support when WBing on L LE.    Ambulation/Gait             Pre-gait activities: attempted to march in place however pt unable to raise R LE despite max verbal cues and blocking of L knee     Stairs             Wheelchair Mobility    Modified Rankin (Stroke Patients Only) Modified Rankin (Stroke Patients Only) Pre-Morbid Rankin Score: No symptoms Modified Rankin: Severe disability     Balance Overall balance assessment: Needs assistance Sitting-balance support: Single extremity supported;No upper extremity supported Sitting balance-Leahy Scale: Fair Sitting balance - Comments: Pt initially using R UE but able to progress to no UE use with close supervision. Tends to look R and lean slightly L - could correct with cues     Standing balance-Leahy Scale: Poor Standing balance comment: Requiring mod A of 2  Cognition Arousal/Alertness: Awake/alert Behavior During Therapy: WFL for tasks assessed/performed Overall Cognitive Status: Impaired/Different from baseline Area of Impairment: Problem solving;Safety/judgement;Awareness                         Safety/Judgement: Decreased awareness of deficits (L in attention) Awareness: Emergent Problem Solving: Difficulty sequencing;Slow processing General Comments: pt holding head rotated to  the R, educated on importance of looking to the L, dtr present as well and aware        Exercises General Exercises - Lower Extremity Ankle Circles/Pumps: AROM;Both;Supine Quad Sets: AROM;Left;10 reps;Supine Long Arc Quad: AROM;Left;10 reps;Seated (with 5 sec hold at the top)    General Comments General comments (skin integrity, edema, etc.): VSS      Pertinent Vitals/Pain Pain Assessment: No/denies pain    Home Living                          Prior Function            PT Goals (current goals can now be found in the care plan section) Acute Rehab PT Goals PT Goal Formulation: With patient/family Time For Goal Achievement: 12/27/20 Potential to Achieve Goals: Good Progress towards PT goals: Progressing toward goals    Frequency    Min 4X/week      PT Plan Current plan remains appropriate    Co-evaluation              AM-PAC PT "6 Clicks" Mobility   Outcome Measure  Help needed turning from your back to your side while in a flat bed without using bedrails?: A Little Help needed moving from lying on your back to sitting on the side of a flat bed without using bedrails?: A Lot Help needed moving to and from a bed to a chair (including a wheelchair)?: Total Help needed standing up from a chair using your arms (e.g., wheelchair or bedside chair)?: Total Help needed to walk in hospital room?: Total Help needed climbing 3-5 steps with a railing? : Total 6 Click Score: 9    End of Session Equipment Utilized During Treatment: Gait belt Activity Tolerance: Patient tolerated treatment well Patient left: with chair alarm set;in chair;with call bell/phone within reach;with family/visitor present Nurse Communication: Mobility status PT Visit Diagnosis: Other abnormalities of gait and mobility (R26.89);Muscle weakness (generalized) (M62.81);Hemiplegia and hemiparesis Hemiplegia - Right/Left: Left Hemiplegia - dominant/non-dominant: Non-dominant Hemiplegia  - caused by: Cerebral infarction     Time: 2202-5427 PT Time Calculation (min) (ACUTE ONLY): 28 min  Charges:  $Therapeutic Exercise: 8-22 mins $Neuromuscular Re-education: 8-22 mins                     Kittie Plater, PT, DPT Acute Rehabilitation Services Pager #: [phone] Office #: [phone]    Berline Lopes 12/14/2020, 3:23 PM

## 2020-12-14 NOTE — Consult Note (Signed)
ASSESSMENT & PLAN   SYMPTOMATIC RIGHT CAROTID STENOSIS: This patient has a symptomatic right carotid stenosis with significant left-sided weakness.  If the patient shows some clinical improvement over the next several days I think he could be considered for a right carotid endarterectomy in order to lower his risk of future stroke. I have reviewed the indications for carotid endarterectomy, that is to lower the risk of future stroke. I have also reviewed the potential complications of surgery, including but not limited to: bleeding, stroke (perioperative risk 1-2%), MI, nerve injury of other unpredictable medical problems. All of the patients questions were answered and they are agreeable to proceed with surgery.  I will tentatively schedule him for next Tuesday.  He is on aspirin, Plavix, and a statin.  Of note, the stenosis on the left if this progressed to became symptomatic would have to be considered for trams carotid stenting given that it extends very high.   H/O PTCA: The patient does have a history of coronary artery disease and underwent PTCA in [place] in 2015.  He denies any recent chest pain but does admit to some dyspnea on exertion.  I would appreciate input from the medical service on his cardiac risk for surgery.  He may potentially require cardiac consultation for preoperative clearance.  His echo looked good.  Recommend the following which can slow the progression of atherosclerosis and reduce the risk of major adverse cardiac / limb events:  Aspirin 81mg  PO QD.  Atorvastatin 40-80mg  PO QD (or other "high intensity" statin therapy). Complete cessation from all tobacco products. Blood glucose control with goal A1c < 7%. Blood pressure control with goal blood pressure < 140/90 mmHg. Lipid reduction therapy with goal LDL-C <100 mg/dL (<70 if symptomatic from PAD).    REASON FOR CONSULT:    Bilateral carotid disease.  The consult is requested by Dr. Charlean Merl.   HPI:   Joel Peck is a 85 y.o. male who had gone to the [facility] emergency department Saturday with right leg weakness.  The symptoms resolved within an hour.  He was discharged but subsequently developed significant left-sided weakness, left facial droop, and left hemianopsia.  His family also tells me he has some expressive aphasia.  The patient denies any previous history of stroke, TIAs, expressive or receptive aphasia, or amaurosis fugax.   His risk factors for peripheral vascular disease include hypertension and a remote history of tobacco use.  He denies any history of diabetes, hypercholesterolemia, or family history of premature cardiovascular disease.  He did undergo PTCA in [place] in 2015.  Has had no recent chest pain but does admit to dyspnea on exertion.  He does have a history of BPH.  Past Medical History:  Diagnosis Date   Arthritis    Coronary artery disease    Hypertension     History reviewed. No pertinent family history.  SOCIAL HISTORY: Social History   Tobacco Use   Smoking status: Former   Smokeless tobacco: Never   Tobacco comments:    Quit smoking 40 years ago  Substance Use Topics   Alcohol use: No    No Known Allergies  Current Facility-Administered Medications  Medication Dose Route Frequency Provider Last Rate Last Admin    stroke: mapping our early stages of recovery book   Does not apply Once Donnetta Simpers, MD       acetaminophen (TYLENOL) tablet 650 mg  650 mg Oral Q4H PRN Donnetta Simpers, MD  Or   acetaminophen (TYLENOL) 160 MG/5ML solution 650 mg  650 mg Per Tube Q4H PRN Donnetta Simpers, MD       Or   acetaminophen (TYLENOL) suppository 650 mg  650 mg Rectal Q4H PRN Donnetta Simpers, MD       amLODipine (NORVASC) tablet 5 mg  5 mg Oral Daily Rosalin Hawking, MD       aspirin EC tablet 325 mg  325 mg Oral Daily Rosalin Hawking, MD   325 mg at 12/14/20 6384   Chlorhexidine Gluconate Cloth 2 % PADS 6 each  6 each Topical Daily Donnetta Simpers, MD   6 each at 12/13/20 1856   clopidogrel (PLAVIX) tablet 75 mg  75 mg Oral Daily Rosalin Hawking, MD   75 mg at 12/14/20 6659   docusate sodium (COLACE) capsule 100 mg  100 mg Oral BID Donnetta Simpers, MD   100 mg at 12/14/20 0830   enoxaparin (LOVENOX) injection 40 mg  40 mg Subcutaneous Q24H Rosalin Hawking, MD   40 mg at 12/13/20 2000   labetalol (NORMODYNE) injection 5-10 mg  5-10 mg Intravenous Q2H PRN Rosalin Hawking, MD       MEDLINE mouth rinse  15 mL Mouth Rinse BID Donnetta Simpers, MD   15 mL at 12/14/20 0830   metoprolol tartrate (LOPRESSOR) tablet 25 mg  25 mg Oral BID Rosalin Hawking, MD       pantoprazole (PROTONIX) EC tablet 40 mg  40 mg Oral Daily Rosalin Hawking, MD   40 mg at 12/14/20 9357   rosuvastatin (CRESTOR) tablet 40 mg  40 mg Oral QHS Rosalin Hawking, MD   40 mg at 12/13/20 2100   senna-docusate (Senokot-S) tablet 1 tablet  1 tablet Oral QHS PRN Donnetta Simpers, MD       tamsulosin [facility]) capsule 0.4 mg  0.4 mg Oral Daily Donnetta Simpers, MD   0.4 mg at 12/14/20 0177    REVIEW OF SYSTEMS:  [X]  denotes positive finding, [ ]  denotes negative finding Cardiac  Comments:  Chest pain or chest pressure:    Shortness of breath upon exertion: x   Short of breath when lying flat:    Irregular heart rhythm:        Vascular    Pain in calf, thigh, or hip brought on by ambulation:    Pain in feet at night that wakes you up from your sleep:     Blood clot in your veins:    Leg swelling:         Pulmonary    Oxygen at home:    Productive cough:     Wheezing:         Neurologic    Sudden weakness in arms or legs:  x   Sudden numbness in arms or legs:  x   Sudden onset of difficulty speaking or slurred speech:    Temporary loss of vision in one eye:     Problems with dizziness:         Gastrointestinal    Blood in stool:     Vomited blood:         Genitourinary    Burning when urinating:     Blood in urine:        Psychiatric    Major depression:          Hematologic    Bleeding problems:    Problems with blood clotting too easily:        Skin  Rashes or ulcers:        Constitutional    Fever or chills:    -  PHYSICAL EXAM:   Vitals:   12/14/20 0600 12/14/20 0700 12/14/20 0800 12/14/20 0900  BP: (!) 157/61 (!) 181/81 (!) 163/80 (!) 188/77  Pulse: 81 86 86 (!) 101  Resp: 14 18 18 20   Temp:   98.2 F (36.8 C)   TempSrc:   Oral   SpO2: 92% 97% 95% 96%  Weight:      Height:       Body mass index is 26.59 kg/m. GENERAL: The patient is a well-nourished male, in no acute distress. The vital signs are documented above. CARDIAC: There is a regular rate and rhythm.  VASCULAR: He has bilateral carotid bruits. On the right side he has a palpable dorsalis pedis pulse. On the left side he has a palpable dorsalis pedis and posterior tibial pulse. PULMONARY: There is good air exchange bilaterally without wheezing or rales. ABDOMEN: Soft and non-tender with normal pitched bowel sounds.  MUSCULOSKELETAL: There are no major deformities. NEUROLOGIC: He has profound weakness in the left upper extremity.  He has moderate weakness in the left lower extremity.  He has good strength on the right. SKIN: There are no ulcers or rashes noted. PSYCHIATRIC: The patient has a normal affect.  DATA:    CT ANGIO HEAD AND NECK: I reviewed the images of his CT angiogram of the head and neck that was done on 12/12/2020.  This showed a moderate stenosis in the distal common carotid artery (55%) and a moderate stenosis in the proximal right internal carotid artery (50%).  However the patient has significant calcific disease and I think the stenosis may be more significant than that.  On the left side there is noted to be a 30% stenosis.  There is a plaque which extends quite high on the left.  ECHOCARDIOGRAM: I reviewed his echocardiogram from 12/11/2020.  Overall left ventricular systolic function was normal with an ejection fraction of 60 to 65%.  There was  mild aortic valve sclerosis.  LABS: His GFR is greater than 60.  Deitra Mayo [facility]

## 2020-12-14 NOTE — PMR Pre-admission (Signed)
PMR Admission Coordinator Pre-Admission Assessment  Patient: Joel Peck is an 85 y.o., male MRN: [id number] DOB: 15-Feb-1935 Height: 5' 7"  (170.2 cm) Weight: 79 kg  Insurance Information HMO:     PPO:      PCP:      IPA:      80/20:      OTHER:  PRIMARY: [employer]      Policy#:  [id number]      Subscriber: Pt CM Name: Tammy      Phone#: [phone]     Fax#: [phone] Pre-Cert#: [id number]   Employer: Retired Benefits:  Phone #e: [phone]    Name: Johnney Ou Date: 01/02/2014 - still active Deductible: no deductible OOP Max: $3,450 ($105 met) CIR: $325/day co-pay for days 1-6, $0/day days 7-90 SNF:  $0/day co-pay for days 1-20, $184/day co-pay for days 21-100; limited to 100 days/benefit period Outpatient: $15/visit co-pay; limited by medical necessity Home Health:  100% coverage DME: 80% coverage; 20% co-insurance SECONDARY:       Policy#:      Phone#:   Development worker, community:       Phone#:   The Actuary for patients in Inpatient Rehabilitation Facilities with attached Privacy Act [place] Records was provided and verbally reviewed with: Patient  Emergency Contact Information Contact Information     Name Relation Home Work Mobile   Brainards Daughter [phone]     Holley Raring   [phone]   Zarek, Relph   [phone]       Current Medical History  Patient Admitting Diagnosis: R CVA, R CEA  History of Present Illness: Pt is 85 yo male with PMH of hx of HTN, arthritis, THA, and CAD who presented on 12/12/20 with L facial droop, L weakness, and L hemianopsia. Pt found to have Patchy acute ischemic nonhemorrhagic infarct involving the posterior limb of the right internal capsule, right periatrial white  matter, and mesial right temporal lobe.  Pt was given tNKAase and admitted to ICU.  Pt with hx of HTN, arthritis, THA, and CAD.CTA head & neck-negative for LVO, 55% stenosis distal CCA on the right  with 50% diameter stenosis proximal right ICA.  MRI-patchy acute ischemic nonhemorrhagic infarct involving the posterior limb of the right internal capsule right periatrial white matter and mesial right temporal lobe. Vascular surgery consulted due to carotid stenosis, recommend bilateral carotid enderarterectomies to be completed after rehab (right on the day of d/c from CIR, and L as outpatients). Therapies consulted and recommend CIR to assist return to PLOF. Underwent R CEA on 12/20/20 by Dr. Scot Dock.  Complete NIHSS TOTAL: 10  Patient's medical record from [facility] has been reviewed by the rehabilitation admission coordinator and physician.  Past Medical History  Past Medical History:  Diagnosis Date   Arthritis    Coronary artery disease    Hypertension     Has the patient had major surgery during 100 days prior to admission? Yes  Family History   family history is not on file.  Current Medications  Current Facility-Administered Medications:     stroke: mapping our early stages of recovery book, , Does not apply, Once, Dagoberto Ligas, PA-C   0.9 %  sodium chloride infusion, 500 mL, Intravenous, Once PRN, Dagoberto Ligas, PA-C   acetaminophen (TYLENOL) tablet 650 mg, 650 mg, Oral, Q4H PRN, 650 mg at 12/21/20 1004 **OR** acetaminophen (TYLENOL) 160 MG/5ML solution 650 mg, 650 mg, Per Tube, Q4H PRN **OR** acetaminophen (TYLENOL) suppository 650 mg, 650  mg, Rectal, Q4H PRN, Cameron Proud, Matthew, PA-C   alum & mag hydroxide-simeth (MAALOX/MYLANTA) 200-200-20 MG/5ML suspension 15-30 mL, 15-30 mL, Oral, Q2H PRN, Eveland, Matthew, PA-C   amLODipine (NORVASC) tablet 10 mg, 10 mg, Oral, Daily, Dagoberto Ligas, PA-C, 10 mg at 12/21/20 1005   aspirin EC tablet 325 mg, 325 mg, Oral, Daily, Dagoberto Ligas, PA-C, 325 mg at 12/21/20 1005   Chlorhexidine Gluconate Cloth 2 % PADS 6 each, 6 each, Topical, Daily, Dagoberto Ligas, PA-C, 6 each at 12/21/20 1010   clopidogrel  (PLAVIX) tablet 75 mg, 75 mg, Oral, Daily, Dagoberto Ligas, PA-C, 75 mg at 12/21/20 1005   docusate sodium (COLACE) capsule 100 mg, 100 mg, Oral, BID, Dagoberto Ligas, PA-C, 100 mg at 12/21/20 1009   guaiFENesin-dextromethorphan (ROBITUSSIN DM) 100-10 MG/5ML syrup 15 mL, 15 mL, Oral, Q4H PRN, Dagoberto Ligas, PA-C   heparin injection 5,000 Units, 5,000 Units, Subcutaneous, Q8H, Angelia Mould, MD   hydrALAZINE (APRESOLINE) injection 5 mg, 5 mg, Intravenous, Q20 Min PRN, Eveland, Matthew, PA-C   labetalol (NORMODYNE) injection 5-10 mg, 5-10 mg, Intravenous, Q2H PRN, Dagoberto Ligas, PA-C, 5 mg at 12/14/20 2112   magnesium hydroxide (MILK OF MAGNESIA) suspension 30 mL, 30 mL, Oral, Daily, Eveland, Matthew, PA-C, 30 mL at 12/21/20 1010   magnesium sulfate IVPB 2 g 50 mL, 2 g, Intravenous, Daily PRN, Dagoberto Ligas, PA-C   MEDLINE mouth rinse, 15 mL, Mouth Rinse, BID, Eveland, Matthew, PA-C, 15 mL at 12/21/20 1010   melatonin tablet 3 mg, 3 mg, Oral, QHS, Dagoberto Ligas, PA-C, 3 mg at 12/20/20 2033   metoprolol tartrate (LOPRESSOR) injection 2-5 mg, 2-5 mg, Intravenous, Q2H PRN, Dagoberto Ligas, PA-C   metoprolol tartrate (LOPRESSOR) tablet 25 mg, 25 mg, Oral, BID, Dagoberto Ligas, PA-C, 25 mg at 12/21/20 1005   morphine 2 MG/ML injection 2 mg, 2 mg, Intravenous, Q2H PRN, Cameron Proud, Matthew, PA-C   ondansetron (ZOFRAN) injection 4 mg, 4 mg, Intravenous, Q6H PRN, Dagoberto Ligas, PA-C   oxyCODONE-acetaminophen (PERCOCET/ROXICET) 5-325 MG per tablet 1-2 tablet, 1-2 tablet, Oral, Q4H PRN, Dagoberto Ligas, PA-C, 2 tablet at 12/21/20 0425   pantoprazole (PROTONIX) EC tablet 40 mg, 40 mg, Oral, Daily, Dagoberto Ligas, PA-C, 40 mg at 12/21/20 1004   phenol (CHLORASEPTIC) mouth spray 1 spray, 1 spray, Mouth/Throat, PRN, Eveland, Matthew, PA-C   potassium chloride SA (KLOR-CON M) CR tablet 20-40 mEq, 20-40 mEq, Oral, Daily PRN, Dagoberto Ligas, PA-C   rosuvastatin (CRESTOR) tablet 40 mg, 40  mg, Oral, QHS, Dagoberto Ligas, PA-C, 40 mg at 12/20/20 2032   senna-docusate (Senokot-S) tablet 1 tablet, 1 tablet, Oral, QHS PRN, Dagoberto Ligas, PA-C, 1 tablet at 12/16/20 2038   tamsulosin (FLOMAX) capsule 0.4 mg, 0.4 mg, Oral, Daily, Dagoberto Ligas, PA-C, 0.4 mg at 12/21/20 1005  Patients Current Diet:  Diet Order             Diet Heart Room service appropriate? Yes; Fluid consistency: Thin  Diet effective now                   Precautions / Restrictions Precautions Precautions: Fall Precaution Comments: L in attention Restrictions Weight Bearing Restrictions: No   Has the patient had 2 or more falls or a fall with injury in the past year? No  Prior Activity Level Community (5-7x/wk): pt. active in the community PTA  Prior Functional Level Self Care: Did the patient need help bathing, dressing, using the toilet or eating? Independent  Indoor Mobility: Did the patient need assistance with walking  from room to room (with or without device)? Independent  Stairs: Did the patient need assistance with internal or external stairs (with or without device)? Independent  Functional Cognition: Did the patient need help planning regular tasks such as shopping or remembering to take medications? Independent  Patient Information Are you of Hispanic, Latino/a,or Spanish origin?: A. No, not of Hispanic, Latino/a, or Spanish origin What is your race?: B. Black or African American Do you need or want an interpreter to communicate with a doctor or health care staff?: 0. No  Patient's Response To:  Health Literacy and Transportation Is the patient able to respond to health literacy and transportation needs?: Yes Health Literacy - How often do you need to have someone help you when you read instructions, pamphlets, or other written material from your doctor or pharmacy?: Never In the past 12 months, has lack of transportation kept you from medical appointments or from getting  medications?: No In the past 12 months, has lack of transportation kept you from meetings, work, or from getting things needed for daily living?: No  Home Assistive Devices / Equipment Home Equipment: Tub bench, Conservation officer, nature (2 wheels), [employer] - single point, BSC/3in1  Prior Device Use: Indicate devices/aids used by the patient prior to current illness, exacerbation or injury? None of the above  Current Functional Level Cognition  Arousal/Alertness: Awake/alert Overall Cognitive Status: Impaired/Different from baseline Current Attention Level: Sustained Orientation Level: Oriented X4 Safety/Judgement: Decreased awareness of safety, Decreased awareness of deficits General Comments: motivated towards therapy, consistent reminders for attending to the left side of environment. cues for sequencing during weight shifting and while standing for postural correction. Attention: Focused, Sustained Focused Attention: Appears intact Sustained Attention: Appears intact Memory: Impaired Memory Impairment: Storage deficit, Retrieval deficit, Decreased short term memory Decreased Short Term Memory:  (Immediate: 5/5) Awareness: Appears intact Problem Solving: Impaired Problem Solving Impairment: Verbal complex Executive Function: Sequencing, Organizing Sequencing: Impaired Sequencing Impairment: Verbal complex (clock drawing: 0/4) Organizing: Impaired Organizing Impairment: Verbal complex (backward digit span: 2/2 with additional processing time.)    Extremity Assessment (includes Sensation/Coordination)  Upper Extremity Assessment: LUE deficits/detail LUE Deficits / Details: AAROM shoulder flexion/extension, horizontal adduction/abduction and crossing midline;pt able to touch his nose with L hand with increased time and effort. minimal AROM shoulder ROM LUE Coordination: decreased fine motor, decreased gross motor  Lower Extremity Assessment: Defer to PT evaluation RLE Deficits / Details: ROM  WFL; MMT 5/5 RLE Sensation: WNL LLE Deficits / Details: ROM WFL; MMT: grossly 3/5 throughout but with slow movements to achieve full ROM LLE Sensation: WNL LLE Coordination: decreased gross motor    ADLs  Overall ADL's : Needs assistance/impaired Eating/Feeding: NPO Grooming: Minimal assistance, Sitting, Wash/dry hands Grooming Details (indicate cue type and reason): requires cues and assistance for bimanual tasks Upper Body Bathing: Moderate assistance Lower Body Bathing: Maximal assistance Upper Body Dressing : Moderate assistance Lower Body Dressing: Moderate assistance, Sit to/from stand Toilet Transfer: Moderate assistance, +2 for physical assistance Toileting- Clothing Manipulation and Hygiene: Maximal assistance Toileting - Clothing Manipulation Details (indicate cue type and reason): assistance for posterior care in standing Functional mobility during ADLs: Moderate assistance General ADL Comments: pt's catheter appeared to leak, pt required max A for posterior care in standing;    Mobility  Overal bed mobility: Needs Assistance Bed Mobility: Supine to Sit Rolling: Min assist Sidelying to sit: HOB elevated, Mod assist Supine to sit: Min assist Sit to supine: Mod assist General bed mobility comments: Min A for trunk  elevation to come to sitting    Transfers  Overall transfer level: Needs assistance Equipment used: 1 person hand held assist Transfers: Sit to/from Stand, Bed to chair/wheelchair/BSC Sit to Stand: Mod assist Bed to/from chair/wheelchair/BSC transfer type:: Step pivot Step pivot transfers: Mod assist General transfer comment: Required assist for lift assist and steadying. ASsist with weightshifting to take steps. Mild buckling noted in LLE.    Ambulation / Gait / Stairs / Wheelchair Mobility  Ambulation/Gait Ambulation/Gait assistance: Mod assist, +2 safety/equipment Gait Distance (Feet): 20 Feet Assistive device: Rolling walker (2 wheels) Gait  Pattern/deviations: Step-to pattern, Decreased step length - left, Decreased dorsiflexion - left, Decreased stride length, Trunk flexed General Gait Details: mod assist due to left leaning with ambualtion/standing. Cues for upright posture, head up and looking straight ahead. Max cues for sequencing with walker. Assist needed to maintain left hand on walker. Family present in room and able to assist with cardiac monitor during mobility. patient has difficulty with coordination of left LE and foot placement. Gait velocity: decreased Gait velocity interpretation: <1.31 ft/sec, indicative of household ambulator Pre-gait activities: attempted to march in place however pt unable to raise R LE despite max verbal cues and blocking of L knee    Posture / Balance Dynamic Sitting Balance Sitting balance - Comments: left lateral lean in sitting, at end of session pt initiating postural corrections. Balance Overall balance assessment: Needs assistance Sitting-balance support: Feet supported Sitting balance-Leahy Scale: Fair Sitting balance - Comments: left lateral lean in sitting, at end of session pt initiating postural corrections. Postural control: Left lateral lean Standing balance support: Single extremity supported Standing balance-Leahy Scale: Poor Standing balance comment: Reliant on at least 1 UE support High Level Balance Comments: progressed briefly to miguard A for balance when grandson on R side and pt weight shifting with cues and assist for forward gaze and vertical posture    Special needs/care consideration Skin R CEA post op incision with dressing.   Previous Home Environment (from acute therapy documentation) Living Arrangements: Children (daughter lives with him)  Lives With: Daughter Available Help at Discharge: Family, Available 24 hours/day Type of Home: House Home Layout: One level Home Access: Other (comment) (threshold) Bathroom Shower/Tub: Chiropodist:  Handicapped height Bathroom Accessibility: Yes How Accessible: Accessible via wheelchair  Discharge Living Setting Plans for Discharge Living Setting: Patient's home Type of Home at Discharge: House Discharge Home Layout: One level Discharge Home Access: Level entry Discharge Bathroom Shower/Tub: Tub/shower unit Discharge Bathroom Toilet: Handicapped height Discharge Bathroom Accessibility: Yes How Accessible: Accessible via walker, Accessible via wheelchair  [place] Patient Roles: Other (Comment) Contact Information: Chip Canepa Anticipated Caregiver: [phone] Ability/Limitations of Caregiver: Can provide Min A Caregiver Availability: 24/7 Discharge Plan Discussed with Primary Caregiver: Yes Is Caregiver In Agreement with Plan?: Yes Does Caregiver/Family have Issues with Lodging/Transportation while Pt is in Rehab?: No  Goals Patient/Family Goal for Rehab: PT/OT Supervision to Min A Expected length of stay: 12-14 days  Pt/Family Agrees to Admission and willing to participate: Yes Program Orientation Provided & Reviewed with Pt/Caregiver Including Roles  & Responsibilities: Yes  Decrease burden of Care through IP rehab admission: Specialzed equipment needs, Decrease number of caregivers, Bowel and bladder program, and Patient/family education  Possible need for SNF placement upon discharge: not anticipated  Patient Condition: I have reviewed medical records from [facility] , spoken with CM, and patient. I met with patient at the bedside for inpatient rehabilitation assessment.  Patient will benefit  from ongoing PT and OT, can actively participate in 3 hours of therapy a day 5 days of the week, and can make measurable gains during the admission.  Patient will also benefit from the coordinated team approach during an Inpatient Acute Rehabilitation admission.  The patient will receive intensive therapy as well as Rehabilitation physician,  nursing, social worker, and care management interventions.  Due to safety, skin/wound care, disease management, medication administration, pain management, and patient education the patient requires 24 hour a day rehabilitation nursing.  The patient is currently min to mod assist with mobility and basic ADLs.  Discharge setting and therapy post discharge at home with home health is anticipated.  Patient has agreed to participate in the Acute Inpatient Rehabilitation Program and will admit today.  Preadmission Screen Completed By:  Retta Diones, 12/21/2020 11:34 AM ______________________________________________________________________   Discussed status with Dr. Dagoberto Ligas on 12/21/20 at 10:00 am and received approval for admission today.  Admission Coordinator:  Retta Diones, RN, time 11:33 am /Date 12/21/20   Assessment/Plan: Diagnosis: Does the need for close, 24 hr/day Medical supervision in concert with the patient's rehab needs make it unreasonable for this patient to be served in a less intensive setting? Yes Co-Morbidities requiring supervision/potential complications: R CEA, L hemiparesis; L hemianopsia; ; CAD; HTN; A1c of 6.4 Due to bladder management, bowel management, safety, skin/wound care, disease management, medication administration, pain management, and patient education, does the patient require 24 hr/day rehab nursing? Yes Does the patient require coordinated care of a physician, rehab nurse, PT, OT, and SLP to address physical and functional deficits in the context of the above medical diagnosis(es)? Yes Addressing deficits in the following areas: balance, endurance, locomotion, strength, transferring, bowel/bladder control, bathing, dressing, feeding, grooming, toileting, cognition, and speech Can the patient actively participate in an intensive therapy program of at least 3 hrs of therapy 5 days a week? Yes The potential for patient to make measurable gains while on inpatient  rehab is good Anticipated functional outcomes upon discharge from inpatient rehab: supervision and min assist PT, supervision and min assist OT, supervision and min assist SLP Estimated rehab length of stay to reach the above functional goals is: 12-14 days Anticipated discharge destination: Home 10. Overall Rehab/Functional Prognosis: good   MD Signature:

## 2020-12-14 NOTE — Progress Notes (Signed)
Pt arrived to 5w 31, pt a&ox4, skin intact. Pt and family oriented to room. BP (!) 180/81    Pulse 72    Temp 98.5 F (36.9 C) (Oral)    Resp 17    Ht 5\' 7"  (1.702 m)    Wt 77 kg    SpO2 96%    BMI 26.59 kg/m

## 2020-12-14 NOTE — Progress Notes (Signed)
Inpatient Rehab Admissions Coordinator:   I spoke with Pt. And daughters regarding potential CIR admission. Pt would like to pursue. I will send case to insurance.   Clemens Catholic, [place], [place] Admissions Coordinator  [phone] ([place]) [phone] (office)

## 2020-12-14 NOTE — Progress Notes (Addendum)
STROKE TEAM PROGRESS NOTE   INTERVAL HISTORY His family is at the bedside. We restarted some of his home blood pressure medications for more adequate control. PRN labetolol also available. Patient has been working with PT/OT and is currently sitting up in the chair. He is eager to get back to playing golf. Plan for a vascular surgery consult with Dr. Scot Dock because of his carotid stenosis and stroke. Family clarified that the symptoms the patient had at [facility] were only on the left side. VVS consult for carotid stenosis. Tentatively plan for a carotid endarterectomy next week. Cardiology contacted for cardiac clearance prior to surgery.   Vitals:   12/14/20 0600 12/14/20 0700 12/14/20 0800 12/14/20 0900  BP: (!) 157/61 (!) 181/81 (!) 163/80 (!) 188/77  Pulse: 81 86 86 (!) 101  Resp: 14 18 18 20   Temp:   98.2 F (36.8 C)   TempSrc:   Oral   SpO2: 92% 97% 95% 96%  Weight:      Height:       CBC:  Recent Labs  Lab 12/12/20 1745 12/12/20 1751 12/14/20 0701  WBC 12.9*  --  11.1*  NEUTROABS 8.7*  --   --   HGB 13.1 14.3 12.6*  HCT 40.6 42.0 38.3*  MCV 83.9  --  81.8  PLT 251  --  812    Basic Metabolic Panel:  Recent Labs  Lab 12/12/20 1745 12/12/20 1751 12/14/20 0701  NA 138 141 139  K 4.0 4.0 3.5  CL 107 106 107  CO2 24  --  24  GLUCOSE 140* 142* 102*  BUN 12 11 11   CREATININE 1.03 0.90 1.00  CALCIUM 8.9  --  8.9    Lipid Panel:  Recent Labs  Lab 12/13/20 0449  CHOL 165  TRIG 28  HDL 48  CHOLHDL 3.4  VLDL 6  LDLCALC 111*    HgbA1c:  Recent Labs  Lab 12/13/20 0449  HGBA1C 6.4*    Urine Drug Screen: No results for input(s): LABOPIA, COCAINSCRNUR, LABBENZ, AMPHETMU, THCU, LABBARB in the last 168 hours.  Alcohol Level No results for input(s): ETH in the last 168 hours.  IMAGING past 24 hours CT HEAD WO CONTRAST (5MM)  Result Date: 12/13/2020 CLINICAL DATA:  Follow-up stroke in the right hemisphere. EXAM: CT HEAD WITHOUT CONTRAST TECHNIQUE:  Contiguous axial images were obtained from the base of the skull through the vertex without intravenous contrast. COMPARISON:  CT and MRI studies done yesterday. FINDINGS: Brain: No abnormality is seen affecting the brainstem or cerebellum. Subtle low density is now visible at the site of acute infarction in the mesial right temporal lobe, hippocampus and inferior basal ganglia. No sign of new ischemic insult. The brain otherwise shows age related volume loss with mild chronic small-vessel change of the white matter. No mass, hemorrhage, hydrocephalus or extra-axial collection. Vascular: There is atherosclerotic calcification of the major vessels at the base of the brain. Skull: Negative Sinuses/Orbits: Clear except for chronic inflammatory change of the diminutive left division of the sphenoid sinus. Orbits negative. Other: None IMPRESSION: Development of subtle low density in the mesial temporal lobe, hippocampus and inferior basal ganglia on the right at the site of acute infarction shown by MRI. No evidence of mass effect or hemorrhage. No new ischemic insult. Electronically Signed   By: Nelson Chimes M.D.   On: 12/13/2020 17:31    PHYSICAL EXAM  Temp:  [98.2 F (36.8 C)-99.1 F (37.3 C)] 98.2 F (36.8 C) (12/13 0800)  Pulse Rate:  [64-101] 101 (12/13 0900) Resp:  [12-20] 20 (12/13 0900) BP: (143-188)/(56-126) 188/77 (12/13 0900) SpO2:  [90 %-97 %] 96 % (12/13 0900)  General - Well nourished, well developed, in no apparent distress. Ophthalmologic - fundi not visualized due to noncooperation. Cardiovascular - Regular rhythm and rate. Mental Status -  Level of arousal and orientation to time, place, and person were intact. Language including expression, naming, repetition, comprehension was assessed and found intact.  Mild dysarthria. Attention span and concentration were normal. Recent and remote memory were intact. Fund of Knowledge was assessed and was intact.  Cranial Nerves II - XII  - II -left upper patchy visual field deficit III, IV, VI - Extraocular movements intact. V - Facial sensation intact bilaterally. VII -facial symmetry intact VIII - Hearing & vestibular intact bilaterally. X - Palate elevates symmetrically. XI - Chin turning & shoulder shrug intact bilaterally. XII - Tongue protrusion intact.  Motor Strength -  Bulk was normal and fasciculations were absent.   Left upper extremity strength 3/5 with some effort against gravity Left lower extremity strength 4/5 with drift present Bilateral right extremities 5/5 Motor Tone - Muscle tone was assessed at the neck and appendages and was normal.  Reflexes - The patients reflexes were symmetrical in all extremities and he had no pathological reflexes. Sensory - Light touch, temperature/pinprick were assessed and were symmetrical.   Coordination -no ataxia noted.  Patient has diminished fine motor control and decreased coordination in the upper extremities Gait and Station - deferred.  ASSESSMENT/PLAN [name] is a 85 y.o. male with a past medical history significant for hypertension arthritis CAD who presents with left-sided weakness left facial droop and left hemianopsia.  Patient was seen at [facility] 12/10 for a 1 hour episode of left sided weakness. He was discharged 12/11 around 1330. Around 1700 he had a sudden onset of left-sided weakness.  EMS was called and he was brought to the emergency department.  Family states that right leg weakness had completely resolved.  When he was discharged from [facility] and he had no symptoms and he was discharged on Plavix.  He received TNKase at 1753  Stroke: scattered infarcts at right MCA/PCA border zone and right AchA territory s/p TNK , likely secondary to large vessel source from right ICA high grade stenosis.  Code Stroke CT head No acute abnormality. ASPECTS 10.    Repeat head CT-development of subtle low-density in the mesial temporal  lobe, hippocampus, and inferior basal ganglia on the right side. CTA head & neck-negative for LVO, 55% stenosis distal CCA on the right with 50% diameter stenosis proximal right ICA.  MRI-patchy acute ischemic nonhemorrhagic infarct involving the posterior limb of the right internal capsule right periatrial white matter and mesial right temporal lobe.  (right medial temporal lobe (MCA/PCA) and right optic radiation and PLIC (AchA) infarcts) 2D Echo EF 60 to 65% LDL 111 HgbA1c 6.4 VTE prophylaxis - SCDs No anticoagulation prior to admission, now on aspirin 81 mg daily and clopidogrel 75 mg daily for 3 months and then aspirin alone. Patient was prescribed Plavix 75 mg at the previous hospital but had not taken it yet Therapy recommendations: Acute inpatient rehab Disposition: Pending  Carotid stenosis Pt has been following in [place] for b/l carotid stenosis CTA head & neck 55% stenosis distal CCA on the right with 50% diameter stenosis proximal right ICA. Left ICA 30% stenosis but prominent atherosclerosis with soft plaques Current stroke likely  related to right ICA stenosis The VVS consult with Dr. Scot Dock Tentative plan for carotid endarterectomy on Tuesday assuming patient has some improvement.  Cardiology consult for preop clearance.  Hx stroke/TIA discharged 12/10 from [facility] after a transient episode of left sided weakness.  Patient was discharged on Plavix and aspirin.  Hypertension Home meds: Hydralazine 25 mg, Lopressor 25 mg, amlodipine 2.5 mg Stable on the higher end Long-term BP goal normotensive Amlodipine 5 mg and Lopressor 25 mg restarted.  Hyperlipidemia Home meds: Rosuvastatin 20 mg, resumed in hospital LDL 111, goal < 70 increase rosuvastatin to 40 mg High intensity statin Continue statin at discharge  Other Stroke Risk Factors Advanced Age >/= 60  Coronary artery disease Quit smoking 40 years ago  Other Active Problems BPH Flomax 0.4  mg GERD Omeprazole 20 mg  Hospital day # 2  Coordinating CEA with CIR admission or discharge if possible. Request cardiac clearance prior to procedure.   Patient seen and examined by NP/APP with MD. MD to update note as needed.   Janine Ores, DNP, FNP-BC [facility] Pager: [phone]  ATTENDING NOTE: I reviewed above note and agree with the assessment and plan. Pt was seen and examined.   Daughter at bedside.  Patient sitting in chair working with PT.  Still has left hemiparesis, left upper extremity 3/5, left lower extremity 4/5.  Mild left facial droop and dysarthria.  Vascular surgery consulted for right carotid stenosis.  Dr. Scot Dock plan for right CEA next week.  Carotid Doppler pending.  For left ICA stenosis, Dr. Doren Custard plan outpatient left CEA selective surgery in the future.  Now on DAPT for 3 months and then aspirin alone.  Continue Crestor 40.  PT/OT recommend CIR.  For detailed assessment and plan, please refer to above as I have made changes wherever appropriate.   Rosalin Hawking, MD PhD Stroke Neurology 12/14/2020 5:26 PM  This patient is critically ill due to right MCA/PCA and AchA infarcts, bilateral carotid stenosis, s/p TNK and at significant risk of neurological worsening, death form recurrent stroke, hemorrhagic conversion. This patient's care requires constant monitoring of vital signs, hemodynamics, respiratory and cardiac monitoring, review of multiple databases, neurological assessment, discussion with family, other specialists and medical decision making of high complexity. I spent 35 minutes of neurocritical care time in the care of this patient. I had long discussion with daughter and patient at bedside, updated pt current condition, treatment plan and potential prognosis, and answered all the questions.  They expressed understanding and appreciation.       To contact Stroke Continuity provider, please refer to [email or web address] After hours, contact  General Neurology

## 2020-12-15 ENCOUNTER — Inpatient Hospital Stay (HOSPITAL_COMMUNITY): Payer: PPO

## 2020-12-15 DIAGNOSIS — Z01810 Encounter for preprocedural cardiovascular examination: Secondary | ICD-10-CM

## 2020-12-15 DIAGNOSIS — I639 Cerebral infarction, unspecified: Secondary | ICD-10-CM

## 2020-12-15 LAB — BASIC METABOLIC PANEL
Anion gap: 7 (ref 5–15)
BUN: 7 mg/dL — ABNORMAL LOW (ref 8–23)
CO2: 26 mmol/L (ref 22–32)
Calcium: 8.8 mg/dL — ABNORMAL LOW (ref 8.9–10.3)
Chloride: 105 mmol/L (ref 98–111)
Creatinine, Ser: 0.87 mg/dL (ref 0.61–1.24)
GFR, Estimated: >60 mL/min (ref >60)
Glucose, Bld: 104 mg/dL — ABNORMAL HIGH (ref 70–99)
Potassium: 3.4 mmol/L — ABNORMAL LOW (ref 3.5–5.1)
Sodium: 138 mmol/L (ref 135–145)

## 2020-12-15 LAB — CBC
HCT: 37.4 % — ABNORMAL LOW (ref 39.0–52.0)
Hemoglobin: 12.4 g/dL — ABNORMAL LOW (ref 13.0–17.0)
MCH: 27.3 pg (ref 26.0–34.0)
MCHC: 33.2 g/dL (ref 30.0–36.0)
MCV: 82.2 fL (ref 80.0–100.0)
Platelets: 221 10*3/uL (ref 150–400)
RBC: 4.55 MIL/uL (ref 4.22–5.81)
RDW: 13.6 % (ref 11.5–15.5)
WBC: 10 10*3/uL (ref 4.0–10.5)
nRBC: 0 % (ref 0.0–0.2)

## 2020-12-15 MED ORDER — POTASSIUM CHLORIDE CRYS ER 20 MEQ PO TBCR
40.0000 meq | EXTENDED_RELEASE_TABLET | Freq: Once | ORAL | Status: AC
Start: 2020-12-15 — End: 2020-12-15
  Administered 2020-12-15: 12:00:00 40 meq via ORAL
  Filled 2020-12-15: qty 2

## 2020-12-15 MED ORDER — AMLODIPINE BESYLATE 10 MG PO TABS
10.0000 mg | ORAL_TABLET | Freq: Every day | ORAL | Status: DC
  Administered 2020-12-16 – 2020-12-21 (×6): 10 mg via ORAL
  Filled 2020-12-15 (×6): qty 1

## 2020-12-15 NOTE — Progress Notes (Signed)
Occupational Therapy Treatment Patient Details Name: Joel Peck MRN: [id number] DOB: 1935-06-13 Today's Date: 12/15/2020   History of present illness Pt is 85 yo male who presented on 12/12/20 with L facial droop, L weakness, and L hemianopsia. Pt found to have Patchy acute ischemic nonhemorrhagic infarct involving the posterior limb of the right internal capsule, right periatrial white  matter, and mesial right temporal lobe.  Pt was given tNKAase and admitted to ICU.  Pt with hx of HTN, arthritis, THA, and CAD.   OT comments  Patient's treatment focused on LUE Neuromuscular education and patient/family education. Patient seated up in recliner and tolerated AAROM for LUE shoulder flexion/extension, elbow flexion/extension, wrist flexion/extension, and finger flexion/extension. ROM techniques instructed to family for better carryover. Table slides activities performed to increase active movement with gravity eliminated. Acute OT to continue to follow.    Recommendations for follow up therapy are one component of a multi-disciplinary discharge planning process, led by the attending physician.  Recommendations may be updated based on patient status, additional functional criteria and insurance authorization.    Follow Up Recommendations  Acute inpatient rehab (3hours/day)    Assistance Recommended at Discharge Frequent or constant Supervision/Assistance  Equipment Recommendations  BSC/3in1    Recommendations for Other Services      Precautions / Restrictions Precautions Precautions: Fall Precaution Comments: L in attention Restrictions Weight Bearing Restrictions: No       Mobility Bed Mobility               General bed mobility comments: patient recieved in recliner    Transfers                         Balance                                           ADL either performed or assessed with clinical judgement   ADL                                               Extremity/Trunk Assessment Upper Extremity Assessment LUE Deficits / Details: AAROM patient demonstrated minimal movement for shoulder flexion/extension and elbow flexion/extension, and finger flexion LUE Coordination: decreased fine motor;decreased gross motor            Vision   Eye Alignment: Within Functional Limits Alignment/Gaze Preference: Head turned (to right) Tracking/Visual Pursuits: Able to track stimulus in all quads without difficulty Saccades: Decreased speed of saccadic movement Visual Fields: Left visual field deficit Additional Comments: will track to left with verbal cues   Perception     Praxis      Cognition Arousal/Alertness: Awake/alert Behavior During Therapy: WFL for tasks assessed/performed Overall Cognitive Status: Impaired/Different from baseline Area of Impairment: Problem solving;Awareness                           Awareness: Emergent Problem Solving: Difficulty sequencing;Slow processing General Comments: education on LUE positioning          Exercises Exercises: General Upper Extremity General Exercises - Upper Extremity Shoulder Flexion: AAROM;10 reps;Left;Seated Shoulder Extension: AAROM;Left;10 reps;Seated Elbow Flexion: AAROM;Left;10 reps;Seated Elbow Extension: AAROM;Left;10 reps;Seated Wrist Flexion: PROM;Left;10 reps Wrist Extension: PROM;Left;10 reps  Digit Composite Flexion: AAROM;Left;10 reps Composite Extension: AAROM;10 reps;Left   Shoulder Instructions       General Comments      Pertinent Vitals/ Pain       Pain Assessment: No/denies pain  Home Living                                          Prior Functioning/Environment              Frequency  Min 2X/week        Progress Toward Goals  OT Goals(current goals can now be found in the care plan section)  Progress towards OT goals: Progressing toward goals  Acute Rehab OT  Goals Patient Stated Goal: be more mobile OT Goal Formulation: With patient/family Time For Goal Achievement: 12/27/20 Potential to Achieve Goals: Good ADL Goals Pt Will Perform Eating: with set-up;with supervision Pt Will Perform Grooming: with set-up;with supervision Pt Will Perform Upper Body Bathing: with set-up;with supervision;sitting Pt Will Perform Lower Body Bathing: with min assist Pt Will Transfer to Toilet: with min assist;bedside commode Additional ADL Goal #1: Pt will use LUE as functional assist during ADL tasks with min VC  Plan Discharge plan remains appropriate    Co-evaluation                 AM-PAC OT "6 Clicks" Daily Activity     Outcome Measure   Help from another person eating meals?: Total Help from another person taking care of personal grooming?: A Lot Help from another person toileting, which includes using toliet, bedpan, or urinal?: A Lot Help from another person bathing (including washing, rinsing, drying)?: A Lot Help from another person to put on and taking off regular upper body clothing?: A Lot Help from another person to put on and taking off regular lower body clothing?: A Lot 6 Click Score: 11    End of Session    OT Visit Diagnosis: Unsteadiness on feet (R26.81);Other abnormalities of gait and mobility (R26.89);Muscle weakness (generalized) (M62.81);Other symptoms and signs involving the nervous system (R29.898);Hemiplegia and hemiparesis Hemiplegia - Right/Left: Left Hemiplegia - dominant/non-dominant: Non-Dominant Hemiplegia - caused by: Cerebral infarction   Activity Tolerance Patient tolerated treatment well   Patient Left in chair;with call bell/phone within reach;with chair alarm set;with family/visitor present   Nurse Communication Mobility status        Time: 3500-9381 OT Time Calculation (min): 32 min  Charges: OT General Charges $OT Visit: 1 Visit OT Treatments $Neuromuscular Re-education: 23-37 mins  Lodema Hong, OTA Acute Rehabilitation Services  Pager [phone] Office [place] 12/15/2020, 3:13 PM

## 2020-12-15 NOTE — Progress Notes (Signed)
SLP Cancellation Note  Patient Details Name: Joel Peck MRN: [id number] DOB: Sep 15, 1935   Cancelled treatment:       Reason Eval/Treat Not Completed: Patient at procedure or test/unavailable (Pt off unit at this time. SLP will follow up later as schedule allows)  Joel Peck, Joel Peck, Joel Peck Office number [phone] Pager [place] 12/15/2020, 3:05 PM

## 2020-12-15 NOTE — Progress Notes (Signed)
° °  VASCULAR SURGERY ASSESSMENT & PLAN:   SYMPTOMATIC RIGHT CAROTID STENOSIS: This patient had a significant right brain stroke.  His MRI showed patchy acute ischemic nonhemorrhagic infarcts involving the posterior limb of the right internal capsule, right periatrial white matter, and mesial right temporal lobe.  He seems slightly stronger in the left upper extremity this morning.  If he continues to make improvement I would recommend proceeding with right carotid endarterectomy on Tuesday.  However, I think that it would be reasonable for him to go to rehab prior to his surgery if neurology feels this is indicated.  Appreciate Dr. Kyla Balzarine input.  He has been cleared from a cardiac standpoint for surgery.   SUBJECTIVE:   No specific complaints this morning.  PHYSICAL EXAM:   Vitals:   12/15/20 0300 12/15/20 0400 12/15/20 0500 12/15/20 0600  BP: (!) 142/77 140/68 (!) 149/60 (!) 148/66  Pulse: 67 61 64 70  Resp: 16 16 16 17   Temp:  98.8 F (37.1 C)    TempSrc:  Axillary    SpO2: 95% 93% 92% 93%  Weight:      Height:       He seems slightly stronger to grip in the left upper extremity.  LABS:   Lab Results  Component Value Date   WBC 10.0 12/15/2020   HGB 12.4 (L) 12/15/2020   HCT 37.4 (L) 12/15/2020   MCV 82.2 12/15/2020   PLT 221 12/15/2020   Lab Results  Component Value Date   CREATININE 0.87 12/15/2020   Lab Results  Component Value Date   INR 1.0 12/12/2020   CBG (last 3)  Recent Labs    12/12/20 1746  GLUCAP 141*    PROBLEM LIST:    Principal Problem:   Stroke determined by clinical assessment ([place])   CURRENT MEDS:     stroke: mapping our early stages of recovery book   Does not apply Once   amLODipine  5 mg Oral Daily   aspirin EC  325 mg Oral Daily   Chlorhexidine Gluconate Cloth  6 each Topical Daily   clopidogrel  75 mg Oral Daily   docusate sodium  100 mg Oral BID   enoxaparin (LOVENOX) injection  40 mg Subcutaneous Q24H   mouth rinse  15 mL  Mouth Rinse BID   metoprolol tartrate  25 mg Oral BID   pantoprazole  40 mg Oral Daily   rosuvastatin  40 mg Oral QHS   tamsulosin  0.4 mg Oral Daily    Deitra Mayo Office: [phone] 12/15/2020

## 2020-12-15 NOTE — Progress Notes (Addendum)
STROKE TEAM PROGRESS NOTE   INTERVAL HISTORY His son and OT are at the bedside. Pt sitting in chair, still has left hemiparesis, may have some slight improvement. Dr. Johnsie Peck cleared him for CEA from cardiology standpoint. Plan for right CEA next Monday or Tuesday and then CIR placement.   Vitals:   12/15/20 0600 12/15/20 0700 12/15/20 0800 12/15/20 1133  BP: (!) 148/66 (!) 187/74 (!) 158/77 (!) 167/78  Pulse: 70 74 79 73  Resp: 17 17 (!) 21 17  Temp:  98.2 F (36.8 C)  97.9 F (36.6 C)  TempSrc:  Oral  Oral  SpO2: 93% 96% 94% 95%  Weight:      Height:       CBC:  Recent Labs  Lab 12/12/20 1745 12/12/20 1751 12/14/20 0701 12/15/20 0216  WBC 12.9*  --  11.1* 10.0  NEUTROABS 8.7*  --   --   --   HGB 13.1   < > 12.6* 12.4*  HCT 40.6   < > 38.3* 37.4*  MCV 83.9  --  81.8 82.2  PLT 251  --  229 221   < > = values in this interval not displayed.   Basic Metabolic Panel:  Recent Labs  Lab 12/14/20 0701 12/15/20 0216  NA 139 138  K 3.5 3.4*  CL 107 105  CO2 24 26  GLUCOSE 102* 104*  BUN 11 7*  CREATININE 1.00 0.87  CALCIUM 8.9 8.8*   Lipid Panel:  Recent Labs  Lab 12/13/20 0449  CHOL 165  TRIG 28  HDL 48  CHOLHDL 3.4  VLDL 6  LDLCALC 111*   HgbA1c:  Recent Labs  Lab 12/13/20 0449  HGBA1C 6.4*   Urine Drug Screen: No results for input(s): LABOPIA, COCAINSCRNUR, LABBENZ, AMPHETMU, THCU, LABBARB in the last 168 hours.  Alcohol Level No results for input(s): ETH in the last 168 hours.  IMAGING past 24 hours No results found.  PHYSICAL EXAM  Temp:  [97.9 F (36.6 C)-98.8 F (37.1 C)] 97.9 F (36.6 C) (12/14 1133) Pulse Rate:  [61-81] 73 (12/14 1133) Resp:  [13-21] 17 (12/14 1133) BP: (138-187)/(60-81) 167/78 (12/14 1133) SpO2:  [90 %-96 %] 95 % (12/14 1133)  General - Well nourished, well developed, in no apparent distress. Ophthalmologic - fundi not visualized due to noncooperation. Cardiovascular - Regular rhythm and rate. Mental Status -   Level of arousal and orientation to time, place, and person were intact. Language including expression, naming, repetition, comprehension was assessed and found intact.  Mild dysarthria. Attention span and concentration were normal. Recent and remote memory were intact. Fund of Knowledge was assessed and was intact.  Cranial Nerves II - XII - II -left upper patchy visual field deficit III, IV, VI - Extraocular movements intact. V - Facial sensation intact bilaterally. VII -facial symmetry intact VIII - Hearing & vestibular intact bilaterally. X - Palate elevates symmetrically. XI - Chin turning & shoulder shrug intact bilaterally. XII - Tongue protrusion intact.  Motor Strength -  Bulk was normal and fasciculations were absent.   Left upper extremity strength 3/5 with some effort against gravity, hand grip 3/5 Left lower extremity strength 4/5 with drift present Bilateral right extremities 5/5 Motor Tone - Muscle tone was assessed at the neck and appendages and was normal.  Reflexes - The patients reflexes were symmetrical in all extremities and he had no pathological reflexes. Sensory - Light touch, temperature/pinprick were assessed and were symmetrical.   Coordination -no ataxia noted.  Patient  has diminished fine motor control and decreased coordination in the upper extremities Gait and Station - deferred.  ASSESSMENT/PLAN [name] is a 85 y.o. male with a past medical history significant for hypertension arthritis CAD who presents with left-sided weakness left facial droop and left hemianopsia.  Patient was [facility] at [facility] 12/10 for a 1 hour episode of left sided weakness. He was discharged 12/11 around 1330. Around 1700 he had a sudden onset of left-sided weakness.  EMS was called and he was brought to the emergency department.  Family states that right leg weakness had completely resolved.  When he was discharged from [facility] and he had no symptoms  and he was discharged on Plavix.  He received TNKase at 1753  Stroke: scattered infarcts at right MCA/PCA border zone and right AchA territory s/p TNK , likely secondary to large vessel source from right ICA high grade stenosis.  Code Stroke CT head No acute abnormality. ASPECTS 10.    Repeat head CT-development of subtle low-density in the mesial temporal lobe, hippocampus, and inferior basal ganglia on the right side. CTA head & neck-negative for LVO, 55% stenosis distal CCA on the right with 50% diameter stenosis proximal right ICA.  MRI-patchy acute ischemic nonhemorrhagic infarct involving the posterior limb of the right internal capsule right periatrial white matter and mesial right temporal lobe.  (right medial temporal lobe (MCA/PCA) and right optic radiation and PLIC (AchA) infarcts) 2D Echo EF 60 to 65% LDL 111 HgbA1c 6.4 VTE prophylaxis - SCDs No anticoagulation prior to admission, now on aspirin 81 mg daily and clopidogrel 75 mg daily for 3 months and then aspirin alone. Therapy recommendations: Acute inpatient rehab Disposition: Pending  Carotid stenosis Pt has been following in [place] for b/l carotid stenosis CTA head & neck 55% stenosis distal CCA on the right with 50% diameter stenosis proximal right ICA. Left ICA 30% stenosis but prominent atherosclerosis with soft plaques Current stroke likely related to right ICA stenosis Appreciate Dr. Johnsie Peck for cardiac clearance  The [facility] consult with Dr. Scot Peck Tentative plan for carotid endarterectomy on Monday or Tuesday  Hx stroke/TIA discharged 12/10 from [facility] after a transient episode of left sided weakness.  Patient was discharged on Plavix and aspirin.  Hypertension Home meds: Hydralazine 25 mg, Lopressor 25 mg, amlodipine 2.5 mg Stable on the higher end Long-term BP goal normotensive Increase Amlodipine 5 mg->10mg   Continue Lopressor 25 mg   Hyperlipidemia Home meds: Rosuvastatin 20 mg, resumed in  hospital LDL 111, goal < 70 increased rosuvastatin to 40 mg Continue statin at discharge  Other Stroke Risk Factors Advanced Age >/= 80  Coronary artery disease Quit smoking 40 years ago  Other Active Problems BPH Leukocytosis WBC 12.9->11.1->10.0 Hypokalemia - K 3.4 - supplement  Hospital day # 3  Discussed with Dr. Scot Peck. I had long discussion with son and pt at bedside, updated pt current condition, treatment plan and potential prognosis, and answered all the questions. They expressed understanding and appreciation.    Rosalin Hawking, MD PhD Stroke Neurology 12/15/2020 2:04 PM       To contact Stroke Continuity provider, please refer to [email or web address] After hours, contact General Neurology

## 2020-12-15 NOTE — Evaluation (Addendum)
Speech Language Pathology Evaluation Patient Details Name: Joel Peck MRN: [id number] DOB: January 12, 1935 Today's Date: 12/15/2020 Time: 9381-0175 SLP Time Calculation (min) (ACUTE ONLY): 18 min  Problem List:  Patient Active Problem List   Diagnosis Date Noted   Stroke determined by clinical assessment ([place]) 12/12/2020   Osteoarthritis of left hip 07/03/2018   Past Medical History:  Past Medical History:  Diagnosis Date   Arthritis    Coronary artery disease    Hypertension    Past Surgical History:  Past Surgical History:  Procedure Laterality Date   CARDIAC CATHETERIZATION     TOTAL HIP ARTHROPLASTY Left 07/03/2018   Procedure: TOTAL HIP ARTHROPLASTY ANTERIOR APPROACH;  Surgeon: Rod Can, MD;  Location: [facility];  Service: Orthopedics;  Laterality: Left;   HPI:  [name] is a 85 y.o. male with PMH significant for with PMH significant for HTN, arthritis, CAD who presents with L sided weakness, left facial droop and L hemianopsia. CTH with no ICH or large territory stroke. He was given [facility] and will be admitted to the ICU. MRI shows patchy acute ischemic nonhemorrhagic infarct involving the  posterior limb of the right internal capsule, right periatrial white  matter, and mesial right temporal lobe.   Assessment / Plan / Recommendation Clinical Impression  Pt participated in speech/language/cognition evaluation with his niece present for part of the evaluation. 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 Pt stated that he was independent with medication and financial management, but that his daughter completed the checks for bill payment. Pt denied any baseline deficits in speech, language, or cognition. Both parties denied any acute changes in cognition at the beginning of the evaluation, but expressed at the end that his processing speed was slower than baseline for more complex information. Pt's motor speech and language skills were  WFL. [facility] Mental Status Examination was completed to evaluate the pt's cognitive-linguistic skills. He achieved a score of 22/30 which is below the normal limits of 27 or more out of 30 and is suggestive of a mild impairment. He exhibited difficulty in the areas of memory, and executive function, and he benefited from additional processing time for more complex information. Skilled SLP services are clinically indicated at this time to improve cognitive-linguistic function.    SLP Assessment  SLP Recommendation/Assessment: Patient needs continued Speech [facility] SLP Visit Diagnosis: Cognitive communication deficit (R41.841)    Recommendations for follow up therapy are one component of a multi-disciplinary discharge planning process, led by the attending physician.  Recommendations may be updated based on patient status, additional functional criteria and insurance authorization.    Follow Up Recommendations  Acute inpatient rehab (3hours/day)    Assistance Recommended at Discharge  None  Functional Status Assessment Patient has had a recent decline in their functional status and demonstrates the ability to make significant improvements in function in a reasonable and predictable amount of time.  Frequency and Duration min 2x/week  2 weeks      SLP Evaluation Cognition  Overall Cognitive Status: Impaired/Different from baseline Arousal/Alertness: Awake/alert Orientation Level: Oriented X4 Year: 2022 Month: December Day of Week: Correct Attention: Focused;Sustained Focused Attention: Appears intact Sustained Attention: Appears intact Memory: Impaired Memory Impairment: Storage deficit;Retrieval deficit;Decreased short term memory Decreased Short Term Memory:  (Immediate: 5/5) Awareness: Appears intact Problem Solving: Impaired Problem Solving Impairment: Verbal complex Executive Function: Sequencing;Organizing Sequencing: Impaired Sequencing  Impairment: Verbal complex (clock drawing: 0/4) Organizing: Impaired Organizing  Impairment: Verbal complex (backward digit span: 2/2 with additional processing time.)       Comprehension  Auditory Comprehension Overall Auditory Comprehension: Appears within functional limits for tasks assessed Yes/No Questions: Within Functional Limits Commands: Within Functional Limits Conversation: Complex    Expression Expression Primary Mode of Expression: Verbal Verbal Expression Overall Verbal Expression: Appears within functional limits for tasks assessed Initiation: No impairment Level of Generative/Spontaneous Verbalization: Conversation Repetition: No impairment Naming: No impairment Pragmatics: No impairment   Oral / Surveyor, quantity Overall Motor Speech: Appears within functional limits for tasks assessed Respiration: Within functional limits Phonation: Normal Resonance: Within functional limits Articulation: Within functional limitis Intelligibility: Intelligible Motor Planning: Witnin functional limits Motor Speech Errors: Not applicable           Treasa Bradshaw I. Hardin Negus, [place], [place] Office number [phone] Pager [place] 12/15/2020, 4:55 PM

## 2020-12-15 NOTE — Progress Notes (Signed)
Physical Therapy Treatment Patient Details Name: Joel Peck MRN: [id number] DOB: 12-07-1935 Today's Date: 12/15/2020   History of Present Illness Pt is 85 yo male who presented on 12/12/20 with L facial droop, L weakness, and L hemianopsia. Pt found to have Patchy acute ischemic nonhemorrhagic infarct involving the posterior limb of the right internal capsule, right periatrial white  matter, and mesial right temporal lobe.  Pt was given tNKAase and admitted to ICU.  Pt with hx of HTN, arthritis, THA, and CAD.    PT Comments    Pt tolerates treatment well, progressing to gait training during this session. Pt demonstrates L sided weakness, often with narrowed BOS resulting in leftward lean during standing and ambulation attempts. Pt will benefit from continued acute PT services in an effort to improve L sided strength and to reduce falls risk. Pt is highly motivated and appears to be a great candidate for high intensity inpatient PT services at the time of discharge.   Recommendations for follow up therapy are one component of a multi-disciplinary discharge planning process, led by the attending physician.  Recommendations may be updated based on patient status, additional functional criteria and insurance authorization.  Follow Up Recommendations  Acute inpatient rehab (3hours/day)     Assistance Recommended at Discharge Frequent or constant Supervision/Assistance  Equipment Recommendations  Wheelchair (measurements PT);Wheelchair cushion (measurements PT);Other (comment) (hemi walker)    Recommendations for Other Services       Precautions / Restrictions Precautions Precautions: Fall Precaution Comments: L in attention Restrictions Weight Bearing Restrictions: No     Mobility  Bed Mobility Overal bed mobility: Needs Assistance Bed Mobility: Supine to Sit     Supine to sit: Min assist;HOB elevated     General bed mobility comments: verbal cues for sequencing. minA to  power trunk up    Transfers Overall transfer level: Needs assistance Equipment used: Rolling walker (2 wheels);1 person hand held assist Transfers: Sit to/from Stand;Bed to chair/wheelchair/BSC Sit to Stand: Mod assist     Step pivot transfers: Mod assist     General transfer comment: pt requires assist to power into standing as well as verbal cues for hand placement and increased trunk flexion. PT also providing verbal cues to sequence steps during step pivot transfer    Ambulation/Gait Ambulation/Gait assistance: Mod assist Gait Distance (Feet): 15 Feet Assistive device: Rolling walker (2 wheels) Gait Pattern/deviations: Step-to pattern;Ataxic;Narrow base of support Gait velocity: reduced Gait velocity interpretation: <1.31 ft/sec, indicative of household ambulator   General Gait Details: pt often with left lateral trunk lean and narrowed BOS. PT provides physical assist and verbal cues to facilitate more rightward trunk lean. Pt with difficulty advancing LLE with L foot drag   Stairs             Wheelchair Mobility    Modified Rankin (Stroke Patients Only) Modified Rankin (Stroke Patients Only) Pre-Morbid Rankin Score: No symptoms Modified Rankin: Moderately severe disability     Balance Overall balance assessment: Needs assistance Sitting-balance support: No upper extremity supported;Feet supported Sitting balance-Leahy Scale: Fair     Standing balance support: Bilateral upper extremity supported Standing balance-Leahy Scale: Poor Standing balance comment: min-modA for static standing with RW                            Cognition Arousal/Alertness: Awake/alert Behavior During Therapy: WFL for tasks assessed/performed Overall Cognitive Status: Impaired/Different from baseline Area of Impairment: Problem solving;Awareness  Awareness: Emergent Problem Solving: Difficulty sequencing;Slow processing           Exercises General Exercises - Lower Extremity Ankle Circles/Pumps: AROM;Both;5 reps Gluteal Sets: AROM;Both;5 reps Long Arc Quad: AROM;Both;10 reps Hip Flexion/Marching: AROM;Both;5 reps    General Comments General comments (skin integrity, edema, etc.): pt with hypertension, 192/70      Pertinent Vitals/Pain Pain Assessment: No/denies pain    Home Living                          Prior Function            PT Goals (current goals can now be found in the care plan section) Acute Rehab PT Goals Patient Stated Goal: agreeable to rehab Progress towards PT goals: Progressing toward goals    Frequency    Min 4X/week      PT Plan Current plan remains appropriate    Co-evaluation              AM-PAC PT "6 Clicks" Mobility   Outcome Measure  Help needed turning from your back to your side while in a flat bed without using bedrails?: A Little Help needed moving from lying on your back to sitting on the side of a flat bed without using bedrails?: A Little Help needed moving to and from a bed to a chair (including a wheelchair)?: A Lot Help needed standing up from a chair using your arms (e.g., wheelchair or bedside chair)?: A Lot Help needed to walk in hospital room?: Total Help needed climbing 3-5 steps with a railing? : Total 6 Click Score: 12    End of Session   Activity Tolerance: Patient tolerated treatment well Patient left: in chair;with call bell/phone within reach;with chair alarm set;with family/visitor present Nurse Communication: Mobility status PT Visit Diagnosis: Other abnormalities of gait and mobility (R26.89);Muscle weakness (generalized) (M62.81);Hemiplegia and hemiparesis Hemiplegia - Right/Left: Left Hemiplegia - dominant/non-dominant: Non-dominant Hemiplegia - caused by: Cerebral infarction     Time: 7858-8502 PT Time Calculation (min) (ACUTE ONLY): 44 min  Charges:  $Gait Training: 23-37 mins $Therapeutic Activity: 8-22  mins                     Zenaida Niece, PT, DPT Acute Rehabilitation Pager: [phone] Office [place] 12/15/2020, 11:12 AM

## 2020-12-15 NOTE — Progress Notes (Signed)
Inpatient Rehab Admissions Coordinator:   I do not have a CIR bed for this Pt.today and continue to await decision from Pt.'s insurance. I will continue to follow for potential admission pending bed availability and insurance auth.   Clemens Catholic, [place], [place] Admissions Coordinator  [phone] ([place]) [phone] (office)

## 2020-12-15 NOTE — Care Management Important Message (Signed)
Important Message  Patient Details  Name: Joel Peck MRN: [id number] Date of Birth: 1935-10-30   Medicare Important Message Given:  Yes     Orbie Pyo 12/15/2020, 3:34 PM

## 2020-12-15 NOTE — Progress Notes (Signed)
Carotid duplex has been completed.  Results can be found under chart review under CV PROC. 12/15/2020 3:50 PM Eleftheria Taborn RVT, RDMS

## 2020-12-16 DIAGNOSIS — I2510 Atherosclerotic heart disease of native coronary artery without angina pectoris: Secondary | ICD-10-CM | POA: Diagnosis not present

## 2020-12-16 DIAGNOSIS — Z01810 Encounter for preprocedural cardiovascular examination: Secondary | ICD-10-CM | POA: Diagnosis not present

## 2020-12-16 LAB — BASIC METABOLIC PANEL
Anion gap: 7 (ref 5–15)
BUN: 9 mg/dL (ref 8–23)
CO2: 24 mmol/L (ref 22–32)
Calcium: 9.1 mg/dL (ref 8.9–10.3)
Chloride: 107 mmol/L (ref 98–111)
Creatinine, Ser: 1 mg/dL (ref 0.61–1.24)
GFR, Estimated: >60 mL/min (ref >60)
Glucose, Bld: 116 mg/dL — ABNORMAL HIGH (ref 70–99)
Potassium: 3.8 mmol/L (ref 3.5–5.1)
Sodium: 138 mmol/L (ref 135–145)

## 2020-12-16 LAB — CBC
HCT: 38.3 % — ABNORMAL LOW (ref 39.0–52.0)
Hemoglobin: 12.5 g/dL — ABNORMAL LOW (ref 13.0–17.0)
MCH: 27 pg (ref 26.0–34.0)
MCHC: 32.6 g/dL (ref 30.0–36.0)
MCV: 82.7 fL (ref 80.0–100.0)
Platelets: 217 10*3/uL (ref 150–400)
RBC: 4.63 MIL/uL (ref 4.22–5.81)
RDW: 13.5 % (ref 11.5–15.5)
WBC: 10.1 10*3/uL (ref 4.0–10.5)
nRBC: 0 % (ref 0.0–0.2)

## 2020-12-16 NOTE — Progress Notes (Signed)
° °  VASCULAR SURGERY ASSESSMENT & PLAN:   SYMPTOMATIC RIGHT CAROTID STENOSIS: The patient does continue to improve clinically.  For this reason I have moved his right carotid endarterectomy to Monday.  We have discussed the indications for surgery the potential complications and he is agreeable to proceed.  Of note his carotid duplex scan does suggest a more significant stenosis on the right than the CT scan suggested.  Regardless there is clearly a significant stenosis that is likely the source of his symptoms.   SUBJECTIVE:   His only complaint this morning is that he is constipated.  PHYSICAL EXAM:   Vitals:   12/15/20 1951 12/15/20 2320 12/16/20 0315 12/16/20 0743  BP: (!) 164/69 135/70 133/66 (!) 176/72  Pulse: 83  65 70  Resp: 20 (!) 22 17 20   Temp: 99.5 F (37.5 C) 99 F (37.2 C) 98.1 F (36.7 C) 98.1 F (36.7 C)  TempSrc: Oral Oral Oral Oral  SpO2: 97% 97% 98% 95%  Weight:      Height:       Slight improvement in the grip on the left arm.  LABS:   CAROTID DUPLEX: I have independently interpreted the patient's carotid duplex scan.  On the right side there is a 60 to 79% stenosis in the proximal ICA.  On the left side there is a less than 39% stenosis.  Both vertebral arteries are patent with antegrade flow.  Lab Results  Component Value Date   WBC 10.1 12/16/2020   HGB 12.5 (L) 12/16/2020   HCT 38.3 (L) 12/16/2020   MCV 82.7 12/16/2020   PLT 217 12/16/2020   Lab Results  Component Value Date   CREATININE 1.00 12/16/2020   Lab Results  Component Value Date   INR 1.0 12/12/2020    PROBLEM LIST:    Principal Problem:   Stroke determined by clinical assessment ([place])   CURRENT MEDS:     stroke: mapping our early stages of recovery book   Does not apply Once   amLODipine  10 mg Oral Daily   aspirin EC  325 mg Oral Daily   Chlorhexidine Gluconate Cloth  6 each Topical Daily   clopidogrel  75 mg Oral Daily   docusate sodium  100 mg Oral BID   enoxaparin  (LOVENOX) injection  40 mg Subcutaneous Q24H   mouth rinse  15 mL Mouth Rinse BID   metoprolol tartrate  25 mg Oral BID   pantoprazole  40 mg Oral Daily   rosuvastatin  40 mg Oral QHS   tamsulosin  0.4 mg Oral Daily    Deitra Mayo Office: [phone] 12/16/2020

## 2020-12-16 NOTE — Progress Notes (Signed)
Physical Therapy Treatment Patient Details Name: Joel Peck MRN: [id number] DOB: 1935-01-17 Today's Date: 12/16/2020   History of Present Illness 85 yo male admitted 12/12/20 with L facial droop, L weakness, and L hemianopsia. Pt found to have Patchy acute ischemic nonhemorrhagic infarct involving the posterior limb of the right internal capsule, right periatrial white matter, and mesial right temporal lobe.  Pt s/p tNKAase.  PMH: HTN, arthritis, Lt THA, and CAD.    PT Comments    Patient progressing with ambulation in the room able to increase distance and working on midline orientation and postural stability.  Grandson in the room and supportive.  Feel he remains appropriate for acute inpatient rehab.  PT will continue to follow actuely.    Recommendations for follow up therapy are one component of a multi-disciplinary discharge planning process, led by the attending physician.  Recommendations may be updated based on patient status, additional functional criteria and insurance authorization.  Follow Up Recommendations  Acute inpatient rehab (3hours/day)     Assistance Recommended at Discharge Frequent or constant Supervision/Assistance  Equipment Recommendations  Wheelchair (measurements PT);Wheelchair cushion (measurements PT);Other (comment)    Recommendations for Other Services       Precautions / Restrictions Precautions Precautions: Fall Precaution Comments: L in attention     Mobility  Bed Mobility Overal bed mobility: Needs Assistance Bed Mobility: Supine to Sit Rolling: Min assist Sidelying to sit: HOB elevated;Mod assist       General bed mobility comments: assist for L LE off bed and trunk upright    Transfers Overall transfer level: Needs assistance Equipment used: Rolling walker (2 wheels) Transfers: Sit to/from Stand Sit to Stand: Mod assist           General transfer comment: assist for L foot placement and for lifting from EOB, then from  recliner with improved liftoff from recliner    Ambulation/Gait Ambulation/Gait assistance: Mod assist Gait Distance (Feet): 20 Feet (x 2) Assistive device: Rolling walker (2 wheels) Gait Pattern/deviations: Step-to pattern;Step-through pattern;Decreased dorsiflexion - left;Decreased weight shift to right;Knee flexed in stance - left;Shuffle;Wide base of support;Ataxic       General Gait Details: mod facilitation and max cues for R lateral lean, L foot progression, walker management with L hand falling off walker,cues for tall posture and to limit L lateral lean when in stance on L, sliding L foot forward during swing with heavy facilitation for R lateral lean; walked to door then to recliner, then back to door; grandson in the room and supportive helping with equipment   Stairs             Wheelchair Mobility    Modified Rankin (Stroke Patients Only) Modified Rankin (Stroke Patients Only) Pre-Morbid Rankin Score: No symptoms Modified Rankin: Moderately severe disability     Balance Overall balance assessment: Needs assistance Sitting-balance support: No upper extremity supported;Feet supported Sitting balance-Leahy Scale: Fair     Standing balance support: Bilateral upper extremity supported;Single extremity supported Standing balance-Leahy Scale: Poor Standing balance comment: min-modA for static standing with RW               High Level Balance Comments: progressed briefly to miguard A for balance when grandson on R side and pt weight shifting with cues and assist for forward gaze and vertical posture            Cognition Arousal/Alertness: Awake/alert Behavior During Therapy: WFL for tasks assessed/performed Overall Cognitive Status: Impaired/Different from baseline Area of Impairment: Problem solving;Awareness  Current Attention Level: Selective     Safety/Judgement: Decreased awareness of deficits (L inattention)   Problem  Solving: Slow processing;Difficulty sequencing          Exercises      General Comments General comments (skin integrity, edema, etc.): VSS after ambulation, grandson in the room and supportive      Pertinent Vitals/Pain Pain Assessment: No/denies pain    Home Living                          Prior Function            PT Goals (current goals can now be found in the care plan section) Progress towards PT goals: Progressing toward goals    Frequency    Min 4X/week      PT Plan Current plan remains appropriate    Co-evaluation              AM-PAC PT "6 Clicks" Mobility   Outcome Measure  Help needed turning from your back to your side while in a flat bed without using bedrails?: A Little Help needed moving from lying on your back to sitting on the side of a flat bed without using bedrails?: A Lot Help needed moving to and from a bed to a chair (including a wheelchair)?: A Lot Help needed standing up from a chair using your arms (e.g., wheelchair or bedside chair)?: A Lot Help needed to walk in hospital room?: A Lot Help needed climbing 3-5 steps with a railing? : Total 6 Click Score: 12    End of Session Equipment Utilized During Treatment: Gait belt Activity Tolerance: Patient tolerated treatment well Patient left: in chair;with call bell/phone within reach;with family/visitor present;with nursing/sitter in room   PT Visit Diagnosis: Other abnormalities of gait and mobility (R26.89);Muscle weakness (generalized) (M62.81);Hemiplegia and hemiparesis Hemiplegia - Right/Left: Left Hemiplegia - dominant/non-dominant: Non-dominant Hemiplegia - caused by: Cerebral infarction     Time: 1545-1620 PT Time Calculation (min) (ACUTE ONLY): 35 min  Charges:  $Gait Training: 8-22 mins $Neuromuscular Re-education: 8-22 mins                     Magda Kiel, PT Acute Rehabilitation  Services [phone] [phone] 12/16/2020    Reginia Naas 12/16/2020, 4:42 PM

## 2020-12-16 NOTE — TOC CAGE-AID Note (Signed)
Transition of Care [facility]) - CAGE-AID Screening   Patient Details  Name: Joel Peck MRN: [id number] Date of Birth: 1935-01-13  Transition of Care [facility]) CM/SW Contact:    Tayton Decaire C Tarpley-Carter, [place] Phone Number: 12/16/2020, 9:48 AM   Clinical Narrative: Pt is unable to participate in Cage Aid.  Helyn Schwan Tarpley-Carter, MSW, LCSW-A Pronouns:  She/Her/Hers Gerty Transitions of Care Clinical Social Worker Direct Number:  [phone] [email or web address]  CAGE-AID Screening: Substance Abuse Screening unable to be completed due to: : Patient unable to participate             Substance Abuse Education Offered: No

## 2020-12-16 NOTE — Progress Notes (Signed)
STROKE TEAM PROGRESS NOTE   INTERVAL HISTORY Daughter is at the bedside. Pt lying in bed, still has left hemiparesis, with some mild improvement. Dr. Scot Dock plan for right CEA next Monday. CUS showed right ICA 60-79% stenosis.   Vitals:   12/15/20 2320 12/16/20 0315 12/16/20 0743 12/16/20 1146  BP: 135/70 133/66 (!) 176/72 (!) 162/70  Pulse:  65 70 62  Resp: (!) 22 17 20 18   Temp: 99 F (37.2 C) 98.1 F (36.7 C) 98.1 F (36.7 C) 97.7 F (36.5 C)  TempSrc: Oral Oral Oral Oral  SpO2: 97% 98% 95% 95%  Weight:      Height:       CBC:  Recent Labs  Lab 12/12/20 1745 12/12/20 1751 12/15/20 0216 12/16/20 0049  WBC 12.9*   < > 10.0 10.1  NEUTROABS 8.7*  --   --   --   HGB 13.1   < > 12.4* 12.5*  HCT 40.6   < > 37.4* 38.3*  MCV 83.9   < > 82.2 82.7  PLT 251   < > 221 217   < > = values in this interval not displayed.   Basic Metabolic Panel:  Recent Labs  Lab 12/15/20 0216 12/16/20 0049  NA 138 138  K 3.4* 3.8  CL 105 107  CO2 26 24  GLUCOSE 104* 116*  BUN 7* 9  CREATININE 0.87 1.00  CALCIUM 8.8* 9.1   Lipid Panel:  Recent Labs  Lab 12/13/20 0449  CHOL 165  TRIG 28  HDL 48  CHOLHDL 3.4  VLDL 6  LDLCALC 111*   HgbA1c:  Recent Labs  Lab 12/13/20 0449  HGBA1C 6.4*   Urine Drug Screen: No results for input(s): LABOPIA, COCAINSCRNUR, LABBENZ, AMPHETMU, THCU, LABBARB in the last 168 hours.  Alcohol Level No results for input(s): ETH in the last 168 hours.  IMAGING past 24 hours No results found.  PHYSICAL EXAM  Temp:  [97.7 F (36.5 C)-99.5 F (37.5 C)] 97.7 F (36.5 C) (12/15 1146) Pulse Rate:  [62-83] 62 (12/15 1146) Resp:  [16-22] 18 (12/15 1146) BP: (133-176)/(66-86) 162/70 (12/15 1146) SpO2:  [95 %-98 %] 95 % (12/15 1146)  General - Well nourished, well developed, in no apparent distress. Ophthalmologic - fundi not visualized due to noncooperation. Cardiovascular - Regular rhythm and rate. Mental Status -  Level of arousal and orientation  to time, place, and person were intact. Language including expression, naming, repetition, comprehension was assessed and found intact.  Mild dysarthria. Attention span and concentration were normal. Recent and remote memory were intact. Fund of Knowledge was assessed and was intact.  Cranial Nerves II - XII - II -left upper patchy visual field deficit III, IV, VI - Extraocular movements intact. V - Facial sensation intact bilaterally. VII -facial symmetry intact VIII - Hearing & vestibular intact bilaterally. X - Palate elevates symmetrically. XI - Chin turning & shoulder shrug intact bilaterally. XII - Tongue protrusion intact.  Motor Strength -  Bulk was normal and fasciculations were absent.   Left upper extremity strength 3/5 with some effort against gravity, hand grip 3/5 Left lower extremity strength 4/5 with drift present Bilateral right extremities 5/5 Motor Tone - Muscle tone was assessed at the neck and appendages and was normal.  Reflexes - The patients reflexes were symmetrical in all extremities and he had no pathological reflexes. Sensory - Light touch, temperature/pinprick were assessed and were symmetrical.   Coordination -no ataxia noted.  Patient has diminished fine motor  control and decreased coordination in the upper extremities Gait and Station - deferred.  ASSESSMENT/PLAN Joel Peck is a 85 y.o. male with a past medical history significant for hypertension arthritis CAD who presents with left-sided weakness left facial droop and left hemianopsia.  Patient was seen at [facility] 12/10 for a 1 hour episode of left sided weakness. He was discharged 12/11 around 1330. Around 1700 he had a sudden onset of left-sided weakness.  EMS was called and he was brought to the emergency department.  Family states that right leg weakness had completely resolved.  When he was discharged from [facility] and he had no symptoms and he was discharged on  Plavix.  He received TNKase at 1753  Stroke: scattered infarcts at right MCA/PCA border zone and right AchA territory s/p TNK , likely secondary to large vessel source from right ICA high grade stenosis.  Code Stroke CT head No acute abnormality. ASPECTS 10.    Repeat head CT-development of subtle low-density in the mesial temporal lobe, hippocampus, and inferior basal ganglia on the right side. CTA head & neck-negative for LVO, 55% stenosis distal CCA on the right with 50% diameter stenosis proximal right ICA.  MRI-patchy acute ischemic nonhemorrhagic infarct involving the posterior limb of the right internal capsule right periatrial white matter and mesial right temporal lobe.  (right medial temporal lobe (MCA/PCA) and right optic radiation and PLIC (AchA) infarcts) 2D Echo EF 60 to 65% LDL 111 HgbA1c 6.4 VTE prophylaxis - SCDs No anticoagulation prior to admission, now on aspirin 81 mg daily and clopidogrel 75 mg daily for 3 months and then aspirin alone. Therapy recommendations: Acute inpatient rehab Disposition: Pending  Carotid stenosis Pt has been following in [place] for b/l carotid stenosis CTA head & neck 55% stenosis distal CCA on the right with 50% diameter stenosis proximal right ICA. Left ICA 30% stenosis but prominent atherosclerosis with soft plaques CUS right ICA 60-79% stenosis Current stroke likely related to right ICA stenosis Appreciate Dr. Johnsie Cancel for cardiac clearance  The [facility] consult with Dr. Scot Dock Tentative plan for carotid endarterectomy on Monday   Hx stroke/TIA discharged 12/10 from [facility] after a transient episode of left sided weakness.  Patient was discharged on Plavix and aspirin.  Hypertension Home meds: Hydralazine 25 mg, Lopressor 25 mg, amlodipine 2.5 mg Stable on the higher end Long-term BP goal normotensive Increase Amlodipine 5 mg->10mg   Continue Lopressor 25 mg   Hyperlipidemia Home meds: Rosuvastatin 20 mg, resumed in  hospital LDL 111, goal < 70 increased rosuvastatin to 40 mg Continue statin at discharge  Other Stroke Risk Factors Advanced Age >/= 2  Coronary artery disease Quit smoking 40 years ago  Other Active Problems BPH Leukocytosis WBC 12.9->11.1->10.0->10.1 Hypokalemia - K 3.4 - 3.8  Hospital day # 4  Joel Hawking, MD PhD Stroke Neurology 12/16/2020 3:31 PM       To contact Stroke Continuity provider, please refer to [email or web address] After hours, contact General Neurology

## 2020-12-16 NOTE — Plan of Care (Signed)
°  Problem: Ischemic Stroke/TIA Tissue Perfusion: Goal: Complications of ischemic stroke/TIA will be minimized Outcome: Not Progressing   Problem: Self-Care: Goal: Ability to participate in self-care as condition permits will improve Outcome: Not Progressing   Problem: Self-Care: Goal: Verbalization of feelings and concerns over difficulty with self-care will improve Outcome: Not Progressing   Problem: Self-Care: Goal: Ability to communicate needs accurately will improve Outcome: Not Progressing

## 2020-12-16 NOTE — Plan of Care (Signed)
°  Problem: Nutrition: Goal: Risk of aspiration will decrease Outcome: Not Progressing   Problem: Education: Goal: Knowledge of patient specific risk factors will improve (INDIVIDUALIZE FOR PATIENT) Outcome: Not Progressing   Problem: Self-Care: Goal: Ability to participate in self-care as condition permits will improve Outcome: Not Progressing   Problem: Safety: Goal: Ability to remain free from injury will improve Outcome: Not Progressing   Problem: Elimination: Goal: Will not experience complications related to bowel motility Outcome: Not Progressing   Problem: Elimination: Goal: Will not experience complications related to urinary retention Outcome: Not Progressing

## 2020-12-16 NOTE — Progress Notes (Signed)
Primary Cardiologist:    Revankar  Subjective:  Denies SSCP, palpitations or Dyspnea Still with fairly dense left hemiplegia   Objective:  Vitals:   12/15/20 1951 12/15/20 2320 12/16/20 0315 12/16/20 0743  BP: (!) 164/69 135/70 133/66 (!) 176/72  Pulse: 83  65 70  Resp: 20 (!) 22 17 20   Temp: 99.5 F (37.5 C) 99 F (37.2 C) 98.1 F (36.7 C) 98.1 F (36.7 C)  TempSrc: Oral Oral Oral Oral  SpO2: 97% 97% 98% 95%  Weight:      Height:        Intake/Output from previous day:  Intake/Output Summary (Last 24 hours) at 12/16/2020 0810 Last data filed at 12/16/2020 0521 Gross per 24 hour  Intake 100 ml  Output 450 ml  Net -350 ml    Physical Exam: Elderly black male Bilateral carotid bruits SEM Lungs clear Lef hemiplegia LUE >RUE and left facial droop   Lab Results: Basic Metabolic Panel: Recent Labs    12/15/20 0216 12/16/20 0049  NA 138 138  K 3.4* 3.8  CL 105 107  CO2 26 24  GLUCOSE 104* 116*  BUN 7* 9  CREATININE 0.87 1.00  CALCIUM 8.8* 9.1   Liver Function Tests: No results for input(s): AST, ALT, ALKPHOS, BILITOT, PROT, ALBUMIN in the last 72 hours. No results for input(s): LIPASE, AMYLASE in the last 72 hours. CBC: Recent Labs    12/15/20 0216 12/16/20 0049  WBC 10.0 10.1  HGB 12.4* 12.5*  HCT 37.4* 38.3*  MCV 82.2 82.7  PLT 221 217     Imaging: VAS US CAROTID  Result Date: 12/15/2020 Carotid Arterial Duplex Study Patient Name:  Joel Peck  Date of Exam:   12/15/2020 Medical Rec #: [id number]       Accession #:    [id number] Date of Birth: Dec 25, 1935       Patient Gender: M Patient Age:   85 years Exam Location:  [facility] Procedure:      VAS US CAROTID Referring Phys: Harrell Gave DICKSON --------------------------------------------------------------------------------  Indications:       Pre-op carotid stenosis. Risk Factors:      Hypertension, past history of smoking, coronary artery                    disease. Limitations         Today's exam was limited due to heavy calcification and the                    resulting shadowing. Comparison Study:  No previous carotid duplex exams. CTA on 12/12/2020 showed                    50% stenosis of RT ICA & >50% stenosis of RT CCA distal. Performing Technologist: Rogelia Rohrer RVT, RDMS  Examination Guidelines: A complete evaluation includes B-mode imaging, spectral Doppler, color Doppler, and power Doppler as needed of all accessible portions of each vessel. Bilateral testing is considered an integral part of a complete examination. Limited examinations for reoccurring indications may be performed as noted.  Right Carotid Findings: +----------+--------+--------+--------+---------------------+------------------+             PSV cm/s EDV cm/s Stenosis Plaque Description    Comments            +----------+--------+--------+--------+---------------------+------------------+  CCA Prox   84       11                                                          +----------+--------+--------+--------+---------------------+------------------+  CCA Distal 65       8                 heterogenous and      intimal thickening                                         smooth                                    +----------+--------+--------+--------+---------------------+------------------+  ICA Prox   300      27       60-79%   calcific, diffuse and Shadowing                                                  irregular                                 +----------+--------+--------+--------+---------------------+------------------+  ICA Mid    100      10                                                          +----------+--------+--------+--------+---------------------+------------------+  ICA Distal 67       11                                                          +----------+--------+--------+--------+---------------------+------------------+  ECA        345      12       >50%     irregular, calcific   Shadowing                                                   and diffuse                               +----------+--------+--------+--------+---------------------+------------------+ +----------+--------+-------+----------------+-------------------+             PSV cm/s EDV cms Describe         Arm Pressure (mmHG)  +----------+--------+-------+----------------+-------------------+  Subclavian 150              Multiphasic, WNL                      +----------+--------+-------+----------------+-------------------+ +---------+--------+--+--------+--+---------+  Vertebral PSV cm/s 65 EDV cm/s 11 Antegrade  +---------+--------+--+--------+--+---------+  Left Carotid Findings: +----------+--------+--------+--------+---------------------+------------------+             PSV cm/s EDV cm/s Stenosis Plaque Description    Comments            +----------+--------+--------+--------+---------------------+------------------+  CCA  Prox   115      14                                                          +----------+--------+--------+--------+---------------------+------------------+  CCA Distal 99       17                heterogenous and      intimal thickening                                         smooth                                    +----------+--------+--------+--------+---------------------+------------------+  ICA Prox   137      23       1-39%    heterogenous and                                                                 hypoechoic                                +----------+--------+--------+--------+---------------------+------------------+  ICA Mid    84       19                                                          +----------+--------+--------+--------+---------------------+------------------+  ICA Distal 67       12                                                          +----------+--------+--------+--------+---------------------+------------------+  ECA        219      14       >50%                           Shadowing            +----------+--------+--------+--------+---------------------+------------------+ +----------+--------+--------+----------------+-------------------+             PSV cm/s EDV cm/s Describe         Arm Pressure (mmHG)  +----------+--------+--------+----------------+-------------------+  Subclavian 169               Multiphasic, WNL                      +----------+--------+--------+----------------+-------------------+ +---------+--------+--+--------+-+---------+  Vertebral PSV cm/s 36 EDV cm/s 7 Antegrade  +---------+--------+--+--------+-+---------+   Summary: Right Carotid: Velocities in the right ICA are consistent  with a 60-79%                stenosis. Non-hemodynamically significant plaque <50% noted in                the CCA. The ECA appears >50% stenosed. Left Carotid: Velocities in the left ICA are consistent with a 1-39% stenosis.               Non-hemodynamically significant plaque <50% noted in the CCA. The               ECA appears >50% stenosed. Vertebrals:  Bilateral vertebral arteries demonstrate antegrade flow. Subclavians: Normal flow hemodynamics were seen in bilateral subclavian              arteries. *See table(s) above for measurements and observations.  Electronically signed by Deitra Mayo MD on 12/15/2020 at 4:04:22 PM.    Final     Cardiac Studies:  ECG: SR chronic lateral T wave inversions    Telemetry:  NSR   Echo 01/2019 Conclusions  Summary  Normal left ventricular size and systolic function with no appreciable  segmental abnormality.  Ejection fraction is visually estimated at 55-73%  Diastolic function Appears normal  Mild mitral regurgitation.  There is aortic sclerosis noted, with no evidence of stenosis.  Trace aortic regurgitation.  No old study for comparison  Signature  ------------------------------------------------------------------------------   Electronically signed by Abran Richard, MD, FACC(Interpreting physician) on   01/17/2019 02:32 PM   Medications:     stroke: mapping our early stages of recovery book   Does not apply Once   amLODipine  10 mg Oral Daily   aspirin EC  325 mg Oral Daily   Chlorhexidine Gluconate Cloth  6 each Topical Daily   clopidogrel  75 mg Oral Daily   docusate sodium  100 mg Oral BID   enoxaparin (LOVENOX) injection  40 mg Subcutaneous Q24H   mouth rinse  15 mL Mouth Rinse BID   metoprolol tartrate  25 mg Oral BID   pantoprazole  40 mg Oral Daily   rosuvastatin  40 mg Oral QHS   tamsulosin  0.4 mg Oral Daily      Assessment/Plan:    CAD:  stable distant stent 2014 to RCA normal myovue 2020 normal EF Active golfing with no angina prior to stroke continue ASA/Plavix statin and beta blocker ? CEA early next week although left sided deficits seem fairly significant and not improving   Jenkins Rouge 12/16/2020, 8:10 AM

## 2020-12-16 NOTE — Care Management Important Message (Signed)
Important Message  Patient Details  Name: Joel Peck MRN: [id number] Date of Birth: July 22, 1935   Medicare Important Message Given:  Yes     Jaydyn Menon 12/16/2020, 9:12 AM

## 2020-12-17 MED ORDER — BISACODYL 10 MG RE SUPP
10.0000 mg | Freq: Once | RECTAL | Status: AC
  Administered 2020-12-17: 10 mg via RECTAL
  Filled 2020-12-17: qty 1

## 2020-12-17 MED ORDER — MELATONIN 3 MG PO TABS
3.0000 mg | ORAL_TABLET | Freq: Every day | ORAL | Status: DC
  Administered 2020-12-17 – 2020-12-20 (×4): 3 mg via ORAL
  Filled 2020-12-17 (×4): qty 1

## 2020-12-17 MED ORDER — MAGNESIUM HYDROXIDE 400 MG/5ML PO SUSP
30.0000 mL | Freq: Every day | ORAL | Status: DC
  Administered 2020-12-17 – 2020-12-21 (×5): 30 mL via ORAL
  Filled 2020-12-17 (×5): qty 30

## 2020-12-17 MED ORDER — CEFAZOLIN SODIUM-DEXTROSE 1-4 GM/50ML-% IV SOLN
1.0000 g | INTRAVENOUS | Status: AC
  Administered 2020-12-20: 12:00:00 1 g via INTRAVENOUS
  Filled 2020-12-17 (×3): qty 50

## 2020-12-17 MED ORDER — MELATONIN 3 MG PO TABS: 3.0000 mg | ORAL_TABLET | Freq: Every day | ORAL | Status: DC

## 2020-12-17 NOTE — Progress Notes (Signed)
Physical Therapy Treatment Patient Details Name: Joel Peck MRN: [id number] DOB: 1935/10/03 Today's Date: 12/17/2020   History of Present Illness 85 yo male admitted 12/12/20 with L facial droop, L weakness, and L hemianopsia. Pt found to have Patchy acute ischemic nonhemorrhagic infarct involving the posterior limb of the right internal capsule, right periatrial white matter, and mesial right temporal lobe.  Pt s/p tNKAase.  PMH: HTN, arthritis, Lt THA, and CAD.    PT Comments    Patient received in recliner. Agrees to PT session. Family in room. Patient requires cues and assist for left LE placement in preparation to stand. Mod assist for sit to stand. Mod leaning to his left during transfers. Assist needed to keep left UE on walker. Patient requires max cues for sequencing and safety with RW during ambulation. Mod assist for balance. Patient will continue to benefit from skilled PT while here to improve functional independence and safety with mobility. Patient planning for rehab upon discharge. No safe to return home at this time.        Recommendations for follow up therapy are one component of a multi-disciplinary discharge planning process, led by the attending physician.  Recommendations may be updated based on patient status, additional functional criteria and insurance authorization.  Follow Up Recommendations  Acute inpatient rehab (3hours/day)     Assistance Recommended at Discharge Frequent or constant Supervision/Assistance  Equipment Recommendations  Wheelchair (measurements PT);Wheelchair cushion (measurements PT);Other (comment)    Recommendations for Other Services Rehab consult     Precautions / Restrictions Precautions Precautions: Fall Precaution Comments: L in attention Restrictions Weight Bearing Restrictions: No     Mobility  Bed Mobility Overal bed mobility: Needs Assistance Bed Mobility: Sit to Supine       Sit to supine: Mod assist   General  bed mobility comments: needs mod assist for positioning and to bring LEs up onto bed.    Transfers Overall transfer level: Needs assistance Equipment used: Rolling walker (2 wheels);1 person hand held assist Transfers: Sit to/from Stand Sit to Stand: Mod assist;+2 safety/equipment           General transfer comment: Mod assist for sit to stand, heavy lean to left with stand to sit especially    Ambulation/Gait Ambulation/Gait assistance: Mod assist;+2 safety/equipment Gait Distance (Feet): 20 Feet Assistive device: Rolling walker (2 wheels) Gait Pattern/deviations: Step-to pattern;Decreased step length - left;Decreased dorsiflexion - left;Decreased stride length;Trunk flexed Gait velocity: decreased     General Gait Details: mod assist due to left leaning with ambualtion/standing. Cues for upright posture, head up and looking straight ahead. Max cues for sequencing with walker. Assist needed to maintain left hand on walker. Family present in room and able to assist with cardiac monitor during mobility. patient has difficulty with coordination of left LE and foot placement.   Stairs             Wheelchair Mobility    Modified Rankin (Stroke Patients Only) Modified Rankin (Stroke Patients Only) Pre-Morbid Rankin Score: No symptoms Modified Rankin: Moderately severe disability     Balance Overall balance assessment: Needs assistance Sitting-balance support: Feet supported Sitting balance-Leahy Scale: Poor Sitting balance - Comments: Patient leaning to his left in sitting. Needs contact guard to min assist to maintain sitting. Postural control: Left lateral lean Standing balance support: Bilateral upper extremity supported;During functional activity;Reliant on assistive device for balance Standing balance-Leahy Scale: Poor Standing balance comment: reliant on walker for balance and mod assist  Cognition Arousal/Alertness:  Awake/alert Behavior During Therapy: WFL for tasks assessed/performed Overall Cognitive Status: Impaired/Different from baseline Area of Impairment: Safety/judgement;Awareness;Problem solving                   Current Attention Level: Sustained     Safety/Judgement: Decreased awareness of safety;Decreased awareness of deficits Awareness: Intellectual Problem Solving: Difficulty sequencing;Requires verbal cues;Requires tactile cues General Comments: motivated towards therapy        Exercises      General Comments        Pertinent Vitals/Pain Pain Assessment: No/denies pain    Home Living                          Prior Function            PT Goals (current goals can now be found in the care plan section) Acute Rehab PT Goals Patient Stated Goal: agreeable to rehab PT Goal Formulation: With patient/family Time For Goal Achievement: 12/27/20 Potential to Achieve Goals: Good Progress towards PT goals: Progressing toward goals    Frequency    Min 4X/week      PT Plan Current plan remains appropriate    Co-evaluation              AM-PAC PT "6 Clicks" Mobility   Outcome Measure  Help needed turning from your back to your side while in a flat bed without using bedrails?: A Little Help needed moving from lying on your back to sitting on the side of a flat bed without using bedrails?: A Lot Help needed moving to and from a bed to a chair (including a wheelchair)?: A Lot Help needed standing up from a chair using your arms (e.g., wheelchair or bedside chair)?: A Lot Help needed to walk in hospital room?: A Lot Help needed climbing 3-5 steps with a railing? : Total 6 Click Score: 12    End of Session Equipment Utilized During Treatment: Gait belt Activity Tolerance: Patient tolerated treatment well;Patient limited by fatigue Patient left: in bed;with call bell/phone within reach;with bed alarm set;with family/visitor present Nurse  Communication: Mobility status PT Visit Diagnosis: Other abnormalities of gait and mobility (R26.89);Muscle weakness (generalized) (M62.81);Hemiplegia and hemiparesis;Difficulty in walking, not elsewhere classified (R26.2);Unsteadiness on feet (R26.81) Hemiplegia - Right/Left: Left Hemiplegia - dominant/non-dominant: Non-dominant Hemiplegia - caused by: Cerebral infarction     Time: 9480-1655 PT Time Calculation (min) (ACUTE ONLY): 28 min  Charges:  $Gait Training: 23-37 mins                     Krystyna Cleckley, PT, GCS 12/17/20,2:36 PM

## 2020-12-17 NOTE — Progress Notes (Signed)
°  Progress Note    12/17/2020 7:32 AM Hospital Day 4  Subjective:  no complaints;says his left arm is a little bit better.    Afebrile HR 60's-80's 194'R-740'C systolic 14% RA  Vitals:   12/17/20 0000 12/17/20 0515  BP: (!) 147/84 135/67  Pulse: 71 64  Resp: 16 13  Temp: 98.1 F (36.7 C) 98.1 F (36.7 C)  SpO2: 97% 98%    Physical Exam: General:  no distress Lungs:  non labored Extremities:  left hand grip 2-3/5  CBC    Component Value Date/Time   WBC 10.1 12/16/2020 0049   RBC 4.63 12/16/2020 0049   HGB 12.5 (L) 12/16/2020 0049   HCT 38.3 (L) 12/16/2020 0049   PLT 217 12/16/2020 0049   MCV 82.7 12/16/2020 0049   MCH 27.0 12/16/2020 0049   MCHC 32.6 12/16/2020 0049   RDW 13.5 12/16/2020 0049   LYMPHSABS 3.4 12/12/2020 1745   MONOABS 0.7 12/12/2020 1745   EOSABS 0.1 12/12/2020 1745   BASOSABS 0.1 12/12/2020 1745    BMET    Component Value Date/Time   NA 138 12/16/2020 0049   K 3.8 12/16/2020 0049   CL 107 12/16/2020 0049   CO2 24 12/16/2020 0049   GLUCOSE 116 (H) 12/16/2020 0049   BUN 9 12/16/2020 0049   CREATININE 1.00 12/16/2020 0049   CALCIUM 9.1 12/16/2020 0049   GFRNONAA >60 12/16/2020 0049   GFRAA >60 07/04/2018 0310    INR    Component Value Date/Time   INR 1.0 12/12/2020 1745     Intake/Output Summary (Last 24 hours) at 12/17/2020 0732 Last data filed at 12/17/2020 0309 Gross per 24 hour  Intake 454 ml  Output 500 ml  Net -46 ml     Assessment/Plan:  85 y.o. male with symptomatic right carotid artery stenosis Hospital Day 4  -plan for right CEA on 12/19 by Dr. Scot Dock.   -npo after MN Sunday 12/18 -all questions answered this am   Leontine Locket, PA-C [facility] [phone] 12/17/2020 7:32 AM

## 2020-12-17 NOTE — Progress Notes (Signed)
STROKE TEAM PROGRESS NOTE   INTERVAL HISTORY Daughter and friends are at the bedside. Pt sitting in chair, stated that his left arm a little bit improved but still has long way to go. He complained no bowel movement since admission, already taking colace and senakot, will add milk of magnesium and duocolax suppository. Also complained not getting good sleep at night, will give melatonin.   Vitals:   12/16/20 2028 12/17/20 0000 12/17/20 0515 12/17/20 0803  BP: (!) 155/75 (!) 147/84 135/67 (!) 159/63  Pulse: 87 71 64 76  Resp: 18 16 13 12   Temp: 98.3 F (36.8 C) 98.1 F (36.7 C) 98.1 F (36.7 C)   TempSrc: Oral Oral Oral   SpO2: 96% 97% 98% 98%  Weight:      Height:       CBC:  Recent Labs  Lab 12/12/20 1745 12/12/20 1751 12/15/20 0216 12/16/20 0049  WBC 12.9*   < > 10.0 10.1  NEUTROABS 8.7*  --   --   --   HGB 13.1   < > 12.4* 12.5*  HCT 40.6   < > 37.4* 38.3*  MCV 83.9   < > 82.2 82.7  PLT 251   < > 221 217   < > = values in this interval not displayed.   Basic Metabolic Panel:  Recent Labs  Lab 12/15/20 0216 12/16/20 0049  NA 138 138  K 3.4* 3.8  CL 105 107  CO2 26 24  GLUCOSE 104* 116*  BUN 7* 9  CREATININE 0.87 1.00  CALCIUM 8.8* 9.1   Lipid Panel:  Recent Labs  Lab 12/13/20 0449  CHOL 165  TRIG 28  HDL 48  CHOLHDL 3.4  VLDL 6  LDLCALC 111*   HgbA1c:  Recent Labs  Lab 12/13/20 0449  HGBA1C 6.4*   Urine Drug Screen: No results for input(s): LABOPIA, COCAINSCRNUR, LABBENZ, AMPHETMU, THCU, LABBARB in the last 168 hours.  Alcohol Level No results for input(s): ETH in the last 168 hours.  IMAGING past 24 hours No results found.  PHYSICAL EXAM  Temp:  [98.1 F (36.7 C)-98.4 F (36.9 C)] 98.1 F (36.7 C) (12/16 0515) Pulse Rate:  [64-87] 76 (12/16 0803) Resp:  [12-18] 12 (12/16 0803) BP: (135-159)/(63-84) 159/63 (12/16 0803) SpO2:  [96 %-98 %] 98 % (12/16 0803)  General - Well nourished, well developed, in no apparent  distress. Ophthalmologic - fundi not visualized due to noncooperation. Cardiovascular - Regular rhythm and rate. Mental Status -  Level of arousal and orientation to time, place, and person were intact. Language including expression, naming, repetition, comprehension was assessed and found intact.  Mild dysarthria. Attention span and concentration were normal. Recent and remote memory were intact. Fund of Knowledge was assessed and was intact.  Cranial Nerves II - XII - II -left upper patchy visual field deficit III, IV, VI - Extraocular movements intact. V - Facial sensation intact bilaterally. VII -facial symmetry intact VIII - Hearing & vestibular intact bilaterally. X - Palate elevates symmetrically. XI - Chin turning & shoulder shrug intact bilaterally. XII - Tongue protrusion intact.  Motor Strength -  Bulk was normal and fasciculations were absent.   Left upper extremity strength 3/5 with some effort against gravity, hand grip 3/5 Left lower extremity strength 4/5 with drift present Bilateral right extremities 5/5 Motor Tone - Muscle tone was assessed at the neck and appendages and was normal.  Reflexes - The patients reflexes were symmetrical in all extremities and he had no  pathological reflexes. Sensory - Light touch, temperature/pinprick were assessed and were symmetrical.   Coordination -no ataxia noted.  Patient has diminished fine motor control and decreased coordination in the upper extremities Gait and Station - deferred.  ASSESSMENT/PLAN Joel Peck is a 85 y.o. male with a past medical history significant for hypertension arthritis CAD who presents with left-sided weakness left facial droop and left hemianopsia.  Patient was seen at [facility] 12/10 for a 1 hour episode of left sided weakness. He was discharged 12/11 around 1330. Around 1700 he had a sudden onset of left-sided weakness.  EMS was called and he was brought to the emergency department.   Family states that right leg weakness had completely resolved.  When he was discharged from [facility] and he had no symptoms and he was discharged on Plavix. He received TNKase at 1753  Stroke: scattered infarcts at right MCA/PCA border zone and right AchA territory s/p TNK , likely secondary to large vessel source from right ICA high grade stenosis.  Code Stroke CT head No acute abnormality. ASPECTS 10.    Repeat head CT-development of subtle low-density in the mesial temporal lobe, hippocampus, and inferior basal ganglia on the right side. CTA head & neck-negative for LVO, 55% stenosis distal CCA on the right with 50% diameter stenosis proximal right ICA.  MRI-patchy acute ischemic nonhemorrhagic infarct involving the posterior limb of the right internal capsule right periatrial white matter and mesial right temporal lobe.  (right medial temporal lobe (MCA/PCA) and right optic radiation and PLIC (AchA) infarcts) 2D Echo EF 60 to 65% LDL 111 HgbA1c 6.4 VTE prophylaxis - SCDs No anticoagulation prior to admission, now on aspirin 81 mg daily and clopidogrel 75 mg daily for 3 months and then aspirin alone. Therapy recommendations: Acute inpatient rehab Disposition: Pending  Carotid stenosis Pt has been following in [place] for b/l carotid stenosis CTA head & neck 55% stenosis distal CCA on the right with 50% diameter stenosis proximal right ICA. Left ICA 30% stenosis but prominent atherosclerosis with soft plaques CUS right ICA 60-79% stenosis Current stroke likely related to right ICA stenosis Appreciate Dr. Johnsie Cancel for cardiac clearance  [facility] consult with Dr. Scot Dock Tentative plan for carotid endarterectomy on Monday   Hx stroke/TIA discharged 12/10 from [facility] after a transient episode of left sided weakness (confirmed with daughter and patient).  Patient was discharged on Plavix and aspirin.  Hypertension Home meds: Hydralazine 25 mg, Lopressor 25 mg, amlodipine  2.5 mg Stable on the higher end Long-term BP goal normotensive Increase Amlodipine 5 mg->10mg   Continue Lopressor 25 mg   Hyperlipidemia Home meds: Rosuvastatin 20 mg, resumed in hospital LDL 111, goal < 70 increased rosuvastatin to 40 mg Continue statin at discharge  Other Stroke Risk Factors Advanced Age >/= 4  Coronary artery disease Quit smoking 40 years ago  Other Active Problems BPH Leukocytosis WBC 12.9->11.1->10.0->10.1 Hypokalemia - K 3.4 - 3.8 Constipation - on senakot PRN, on colace bid, on milk of magnesium daily and will give duocolax supporsitory Insomnia - melatonin  Hospital day # 5  Rosalin Hawking, MD PhD Stroke Neurology 12/17/2020 12:42 PM       To contact Stroke Continuity provider, please refer to [email or web address] After hours, contact General Neurology

## 2020-12-17 NOTE — Progress Notes (Signed)
Occupational Therapy Treatment Patient Details Name: Joel Peck MRN: [id number] DOB: 10-May-1935 Today's Date: 12/17/2020   History of present illness 85 yo male admitted 12/12/20 with L facial droop, L weakness, and L hemianopsia. Pt found to have Patchy acute ischemic nonhemorrhagic infarct involving the posterior limb of the right internal capsule, right periatrial white matter, and mesial right temporal lobe.  Pt s/p tNKAase.  PMH: HTN, arthritis, Lt THA, and CAD.   OT comments  Patient received in bed and eager to get in recliner. Patient required verbal cues and mod assist for supine to sitting on EOB and to transfer to recliner with RW. Patient performed grooming seated in recliner. Patient's daughter was present and was educated on AAROM exercises to increase carryover. Acute OT to continue to follow.    Recommendations for follow up therapy are one component of a multi-disciplinary discharge planning process, led by the attending physician.  Recommendations may be updated based on patient status, additional functional criteria and insurance authorization.    Follow Up Recommendations  Acute inpatient rehab (3hours/day)    Assistance Recommended at Discharge Frequent or constant Supervision/Assistance  Equipment Recommendations  BSC/3in1    Recommendations for Other Services      Precautions / Restrictions Precautions Precautions: Fall Precaution Comments: L in attention Restrictions Weight Bearing Restrictions: No       Mobility Bed Mobility Overal bed mobility: Needs Assistance Bed Mobility: Supine to Sit     Supine to sit: Mod assist;HOB elevated     General bed mobility comments: required assistance advancingg LLE off bed and with trunk    Transfers Overall transfer level: Needs assistance Equipment used: Rolling walker (2 wheels) Transfers: Sit to/from Stand Sit to Stand: Mod assist   Step pivot transfers: Mod assist       General transfer comment:  RW used for transfer from EOB to recliner with assistance with keeping LUE on walker     Balance Overall balance assessment: Needs assistance Sitting-balance support: No upper extremity supported;Feet supported Sitting balance-Leahy Scale: Fair Sitting balance - Comments: able to maintain sitting balance with RUE assisting with balance   Standing balance support: Bilateral upper extremity supported;Single extremity supported Standing balance-Leahy Scale: Poor Standing balance comment: reliant on walker for balance                           ADL either performed or assessed with clinical judgement   ADL Overall ADL's : Needs assistance/impaired     Grooming: Minimal assistance;Wash/dry hands;Wash/dry face Grooming Details (indicate cue type and reason): able to shave self with electric razor showing good attention to left                               General ADL Comments: grooming performed seated in recliner    Extremity/Trunk Assessment Upper Extremity Assessment LUE Deficits / Details: AAROM patient demonstrated minimal movement for shoulder flexion/extension and elbow flexion/extension, and finger flexion LUE Coordination: decreased fine motor;decreased gross motor            Vision   Eye Alignment: Within Functional Limits Alignment/Gaze Preference: Head turned Tracking/Visual Pursuits: Able to track stimulus in all quads without difficulty Saccades: Decreased speed of saccadic movement Visual Fields: Left visual field deficit Additional Comments: able to correct head turn with verbal cues   Perception     Praxis      Cognition Arousal/Alertness: Awake/alert  Behavior During Therapy: WFL for tasks assessed/performed Overall Cognitive Status: Impaired/Different from baseline Area of Impairment: Problem solving;Awareness                   Current Attention Level: Selective     Safety/Judgement: Decreased awareness of deficits    Problem Solving: Slow processing;Difficulty sequencing General Comments: motivated towards therapy          Exercises Exercises: General Upper Extremity General Exercises - Upper Extremity Shoulder Flexion: AAROM;10 reps;Left;Seated Shoulder Extension: AAROM;Left;10 reps;Seated Shoulder ABduction: AAROM;Left;10 reps;Seated Elbow Flexion: AAROM;Left;10 reps;Seated Elbow Extension: AAROM;Left;10 reps;Seated   Shoulder Instructions       General Comments      Pertinent Vitals/ Pain       Pain Assessment: No/denies pain  Home Living                                          Prior Functioning/Environment              Frequency  Min 2X/week        Progress Toward Goals  OT Goals(current goals can now be found in the care plan section)  Progress towards OT goals: Progressing toward goals  Acute Rehab OT Goals Patient Stated Goal: get more mobile OT Goal Formulation: With patient/family Time For Goal Achievement: 12/27/20 Potential to Achieve Goals: Good ADL Goals Pt Will Perform Eating: with set-up;with supervision Pt Will Perform Grooming: with set-up;with supervision Pt Will Perform Upper Body Bathing: with set-up;with supervision;sitting Pt Will Perform Lower Body Bathing: with min assist Pt Will Transfer to Toilet: with min assist;bedside commode Additional ADL Goal #1: Pt will use LUE as functional assist during ADL tasks with min VC  Plan Discharge plan remains appropriate    Co-evaluation                 AM-PAC OT "6 Clicks" Daily Activity     Outcome Measure   Help from another person eating meals?: Total Help from another person taking care of personal grooming?: A Lot Help from another person toileting, which includes using toliet, bedpan, or urinal?: A Lot Help from another person bathing (including washing, rinsing, drying)?: A Lot Help from another person to put on and taking off regular upper body clothing?: A  Lot Help from another person to put on and taking off regular lower body clothing?: A Lot 6 Click Score: 11    End of Session Equipment Utilized During Treatment: Gait belt;Rolling walker (2 wheels)  OT Visit Diagnosis: Unsteadiness on feet (R26.81);Other abnormalities of gait and mobility (R26.89);Muscle weakness (generalized) (M62.81);Other symptoms and signs involving the nervous system (R29.898);Hemiplegia and hemiparesis Hemiplegia - Right/Left: Left Hemiplegia - dominant/non-dominant: Non-Dominant Hemiplegia - caused by: Cerebral infarction   Activity Tolerance Patient tolerated treatment well   Patient Left in chair;with call bell/phone within reach;with chair alarm set;with family/visitor present   Nurse Communication Mobility status        Time: 9480-1655 OT Time Calculation (min): 33 min  Charges: OT General Charges $OT Visit: 1 Visit OT Treatments $Self Care/Home Management : 8-22 mins $Neuromuscular Re-education: 8-22 mins  Lodema Hong, OTA Acute Rehabilitation Services  Pager [phone] [place] 12/17/2020, 10:56 AM

## 2020-12-18 LAB — BASIC METABOLIC PANEL
Anion gap: 6 (ref 5–15)
BUN: 13 mg/dL (ref 8–23)
CO2: 26 mmol/L (ref 22–32)
Calcium: 9 mg/dL (ref 8.9–10.3)
Chloride: 103 mmol/L (ref 98–111)
Creatinine, Ser: 1.08 mg/dL (ref 0.61–1.24)
GFR, Estimated: >60 mL/min (ref >60)
Glucose, Bld: 113 mg/dL — ABNORMAL HIGH (ref 70–99)
Potassium: 4.3 mmol/L (ref 3.5–5.1)
Sodium: 135 mmol/L (ref 135–145)

## 2020-12-18 LAB — CBC
HCT: 40.3 % (ref 39.0–52.0)
Hemoglobin: 13.7 g/dL (ref 13.0–17.0)
MCH: 27.8 pg (ref 26.0–34.0)
MCHC: 34 g/dL (ref 30.0–36.0)
MCV: 81.9 fL (ref 80.0–100.0)
Platelets: 240 10*3/uL (ref 150–400)
RBC: 4.92 MIL/uL (ref 4.22–5.81)
RDW: 13.3 % (ref 11.5–15.5)
WBC: 10.1 10*3/uL (ref 4.0–10.5)
nRBC: 0 % (ref 0.0–0.2)

## 2020-12-18 NOTE — Progress Notes (Signed)
Occupational Therapy Treatment Patient Details Name: Joel Peck MRN: [id number] DOB: 11-08-1935 Today's Date: 12/18/2020   History of present illness 85 yo male admitted 12/12/20 with L facial droop, L weakness, and L hemianopsia. Pt found to have Patchy acute ischemic nonhemorrhagic infarct involving the posterior limb of the right internal capsule, right periatrial white matter, and mesial right temporal lobe.  Pt s/p tNKAase.  PMH: HTN, arthritis, Lt THA, and CAD.   OT comments  Pt received sitting in recliner, agreeable to OT session. Pt demonstrated improvement with postural correction this session. He requires modA to stand from [place] for stability in standing with maxA for posterior care due to leaking catheter. Pt required cues for weight shifting and assistance with Lhand management on RW while marching in place. Pt required frequent cues for postural correction, towards the end of session pt was initiating postural corrections independently. Pt engaged in LUE HEP and required cues to visually attend to LUE and L side of environment. Pt will continue to benefit from skilled OT services to maximize safety and independence with ADL/IADL and functional mobility. Will continue to follow acutely and progress as tolerated.     Recommendations for follow up therapy are one component of a multi-disciplinary discharge planning process, led by the attending physician.  Recommendations may be updated based on patient status, additional functional criteria and insurance authorization.    Follow Up Recommendations  Acute inpatient rehab (3hours/day)    Assistance Recommended at Discharge Frequent or constant Supervision/Assistance  Equipment Recommendations  BSC/3in1    Recommendations for Other Services Rehab consult    Precautions / Restrictions Precautions Precautions: Fall Precaution Comments: L in attention Restrictions Weight Bearing Restrictions: No       Mobility  Bed Mobility Overal bed mobility: Needs Assistance Bed Mobility: Sit to Supine Rolling: Min assist Sidelying to sit: HOB elevated;Mod assist Supine to sit: Mod assist;HOB elevated Sit to supine: Mod assist   General bed mobility comments: needs mod assist for positioning and to bring LEs up onto bed.    Transfers Overall transfer level: Needs assistance Equipment used: Rolling walker (2 wheels);1 person hand held assist Transfers: Sit to/from Stand Sit to Stand: Mod assist           General transfer comment: while standing pt attempted to march in place, he required cues to weight shift and assistance to maintain Lhand on RW;further mobility deferred for pt and therapist safety as additional staff was not available to assist at this time. moderate left lateral lean this session, pt able to correct with verbal cues and minimal assistance from tehrapist. therapist assisted with management of Lhand placement during sit<>stand and transition to RW     Balance Overall balance assessment: Needs assistance Sitting-balance support: Feet supported Sitting balance-Leahy Scale: Poor Sitting balance - Comments: left lateral lean in sitting, at end of session pt initiating postural corrections. Postural control: Left lateral lean Standing balance support: Bilateral upper extremity supported;During functional activity;Reliant on assistive device for balance Standing balance-Leahy Scale: Poor Standing balance comment: reliant on walker for balance and mod assist;left lateral lean noted in standing, cues to correct posture                           ADL either performed or assessed with clinical judgement   ADL Overall ADL's : Needs assistance/impaired     Grooming: Minimal assistance;Sitting;Wash/dry hands Grooming Details (indicate cue type and reason): requires cues and  assistance for bimanual tasks             Lower Body Dressing: Moderate assistance;Sit to/from stand        Toileting- Water quality scientist and Hygiene: Maximal assistance Toileting - Clothing Manipulation Details (indicate cue type and reason): assistance for posterior care in standing     Functional mobility during ADLs: Moderate assistance General ADL Comments: pt's catheter appeared to leak, pt required max A for posterior care in standing;    Extremity/Trunk Assessment Upper Extremity Assessment Upper Extremity Assessment: LUE deficits/detail LUE Deficits / Details: AAROM shoulder flexion/extension, horizontal adduction/abduction and crossing midline;pt able to touch his nose with L hand with increased time and effort. minimal AROM shoulder ROM LUE Coordination: decreased fine motor;decreased gross motor   Lower Extremity Assessment Lower Extremity Assessment: Defer to PT evaluation        Vision   Vision Assessment?: Vision impaired- to be further tested in functional context;Yes Eye Alignment: Within Functional Limits Alignment/Gaze Preference: Head turned Tracking/Visual Pursuits: Able to track stimulus in all quads without difficulty Saccades: Decreased speed of saccadic movement Visual Fields: Left visual field deficit Additional Comments: will track and attend to the left with verbal cues   Perception     Praxis      Cognition Arousal/Alertness: Awake/alert Behavior During Therapy: WFL for tasks assessed/performed Overall Cognitive Status: Impaired/Different from baseline Area of Impairment: Safety/judgement;Awareness;Problem solving;Memory;Attention                   Current Attention Level: Sustained Memory: Decreased short-term memory   Safety/Judgement: Decreased awareness of safety;Decreased awareness of deficits Awareness: Intellectual Problem Solving: Difficulty sequencing;Requires verbal cues;Requires tactile cues General Comments: motivated towards therapy, consistent reminders for attending to the left side of environment. cues for  sequencing during weight shifting and while standing for postural correction.          Exercises Exercises: General Upper Extremity General Exercises - Upper Extremity Shoulder Flexion: AAROM;10 reps;Left;Seated Shoulder Extension: AAROM;Left;10 reps;Seated Shoulder ABduction: AAROM;Left;10 reps;Seated Shoulder Horizontal ABduction: AAROM;10 reps;Left;Seated Shoulder Horizontal ADduction: AAROM;10 reps;Left;Seated Elbow Flexion: AAROM;Left;10 reps;Seated Elbow Extension: AAROM;Left;10 reps;Seated Wrist Flexion: PROM;Left;10 reps Wrist Extension: PROM;Left;10 reps Digit Composite Flexion: AAROM;Left;10 reps Composite Extension: AAROM;10 reps;Left   Shoulder Instructions       General Comments vss    Pertinent Vitals/ Pain       Pain Assessment: No/denies pain  Home Living                                          Prior Functioning/Environment              Frequency  Min 2X/week        Progress Toward Goals  OT Goals(current goals can now be found in the care plan section)  Progress towards OT goals: Progressing toward goals  Acute Rehab OT Goals Patient Stated Goal: to go to rehab OT Goal Formulation: With patient/family Time For Goal Achievement: 12/27/20 Potential to Achieve Goals: Good ADL Goals Pt Will Perform Eating: with set-up;with supervision Pt Will Perform Grooming: with set-up;with supervision Pt Will Perform Upper Body Bathing: with set-up;with supervision;sitting Pt Will Perform Lower Body Bathing: with min assist Pt Will Transfer to Toilet: with min assist;bedside commode Additional ADL Goal #1: Pt will use LUE as functional assist during ADL tasks with min VC  Plan Discharge plan remains appropriate    Co-evaluation  AM-PAC OT "6 Clicks" Daily Activity     Outcome Measure   Help from another person eating meals?: Total Help from another person taking care of personal grooming?: A Lot Help from  another person toileting, which includes using toliet, bedpan, or urinal?: A Lot Help from another person bathing (including washing, rinsing, drying)?: A Lot Help from another person to put on and taking off regular upper body clothing?: A Lot Help from another person to put on and taking off regular lower body clothing?: A Lot 6 Click Score: 11    End of Session Equipment Utilized During Treatment: Gait belt;Rolling walker (2 wheels)  OT Visit Diagnosis: Unsteadiness on feet (R26.81);Other abnormalities of gait and mobility (R26.89);Muscle weakness (generalized) (M62.81);Other symptoms and signs involving the nervous system (R29.898);Hemiplegia and hemiparesis Hemiplegia - Right/Left: Left Hemiplegia - dominant/non-dominant: Non-Dominant Hemiplegia - caused by: Cerebral infarction   Activity Tolerance Patient tolerated treatment well   Patient Left in chair;with call bell/phone within reach;with chair alarm set   Nurse Communication Mobility status        Time: 1700-1749 OT Time Calculation (min): 28 min  Charges: OT General Charges $OT Visit: 1 Visit OT Treatments $Self Care/Home Management : 8-22 mins $Neuromuscular Re-education: 8-22 mins  Helene Kelp OTR/L Acute Rehabilitation Services Office: [place] 12/18/2020, 3:37 PM

## 2020-12-18 NOTE — Progress Notes (Addendum)
STROKE TEAM PROGRESS NOTE   INTERVAL HISTORY Son is at the bedside. Patient is resting in bed and eager to start rehabilitation after his procedure on Monday. He is hemodynamically stable. No neurological changes.  He appears to be at risk for sleep apnea and is willing to consider possible participation in the sleep smart study.  He still has significant left upper extremity weakness. Vitals:   12/17/20 1500 12/17/20 2003 12/17/20 2205 12/18/20 0803  BP: (!) 145/72 140/68  139/76  Pulse: 90 82 83 81  Resp: 18 18 18 16   Temp: 98.2 F (36.8 C) 98.6 F (37 C) 98.5 F (36.9 C) 97.8 F (36.6 C)  TempSrc: Oral Oral Oral Oral  SpO2: 96% 94% 92% 98%  Weight:      Height:       CBC:  Recent Labs  Lab 12/12/20 1745 12/12/20 1751 12/16/20 0049 12/18/20 0445  WBC 12.9*   < > 10.1 10.1  NEUTROABS 8.7*  --   --   --   HGB 13.1   < > 12.5* 13.7  HCT 40.6   < > 38.3* 40.3  MCV 83.9   < > 82.7 81.9  PLT 251   < > 217 240   < > = values in this interval not displayed.    Basic Metabolic Panel:  Recent Labs  Lab 12/16/20 0049 12/18/20 0445  NA 138 135  K 3.8 4.3  CL 107 103  CO2 24 26  GLUCOSE 116* 113*  BUN 9 13  CREATININE 1.00 1.08  CALCIUM 9.1 9.0    Lipid Panel:  Recent Labs  Lab 12/13/20 0449  CHOL 165  TRIG 28  HDL 48  CHOLHDL 3.4  VLDL 6  LDLCALC 111*    HgbA1c:  Recent Labs  Lab 12/13/20 0449  HGBA1C 6.4*    Urine Drug Screen: No results for input(s): LABOPIA, COCAINSCRNUR, LABBENZ, AMPHETMU, THCU, LABBARB in the last 168 hours.  Alcohol Level No results for input(s): ETH in the last 168 hours.  IMAGING past 24 hours No results found.  PHYSICAL EXAM  Temp:  [97.8 F (36.6 C)-98.6 F (37 C)] 97.8 F (36.6 C) (12/17 0803) Pulse Rate:  [81-90] 81 (12/17 0803) Resp:  [16-18] 16 (12/17 0803) BP: (139-145)/(68-76) 139/76 (12/17 0803) SpO2:  [92 %-98 %] 98 % (12/17 0803)  General -mildly obese elderly African-American male, in no apparent  distress. Ophthalmologic - fundi not visualized due to noncooperation. Cardiovascular - Regular rhythm and rate. Mental Status -  Level of arousal and orientation to time, place, and person were intact. Language including expression, naming, repetition, comprehension was assessed and found intact.  Mild dysarthria. Attention span and concentration were normal. Recent and remote memory were intact. Fund of Knowledge was assessed and was intact.  Cranial Nerves II - XII - II -left upper patchy visual field deficit III, IV, VI - Extraocular movements intact. V - Facial sensation intact bilaterally. VII -facial symmetry intact VIII - Hearing & vestibular intact bilaterally. X - Palate elevates symmetrically. XI - Chin turning & shoulder shrug intact bilaterally. XII - Tongue protrusion intact.  Motor Strength -  Bulk was normal and fasciculations were absent.   Left upper extremity strength 3/5 with some effort against gravity, hand grip 3/5 Left lower extremity strength 4/5 with drift present Bilateral right extremities 5/5 Motor Tone - Muscle tone was assessed at the neck and appendages and was normal.  Reflexes - The patients reflexes were symmetrical in all extremities and  he had no pathological reflexes. Sensory - Light touch, temperature/pinprick were assessed and were symmetrical.   Coordination -no ataxia noted.  Patient has diminished fine motor control and decreased coordination in the upper extremities Gait and Station - deferred.  NIHSS 2 for LUE drift Premorbid MRS 0  ASSESSMENT/PLAN [name] is a 85 y.o. male with a past medical history significant for hypertension arthritis CAD who presents with left-sided weakness left facial droop and left hemianopsia.  Patient was seen at [facility] 12/10 for a 1 hour episode of left sided weakness. He was discharged 12/11 around 1330. Around 1700 he had a sudden onset of left-sided weakness.  EMS was called and he  was brought to the emergency department.  Family states that left leg weakness had completely resolved.  When he was discharged from [facility] and he had no symptoms and he was discharged on Plavix. He received TNKase at 1753. CEA with Dr. Scot Dock on Monday and then plan to discharge to CIR once stable post procedure.   Stroke: scattered infarcts at right MCA/PCA border zone and right ACA territory s/p TNK , likely secondary to large vessel source from right ICA high grade stenosis.  Code Stroke CT head No acute abnormality. ASPECTS 10.    Repeat head CT-development of subtle low-density in the mesial temporal lobe, hippocampus, and inferior basal ganglia on the right side. CTA head & neck-negative for LVO, 55% stenosis distal CCA on the right with 50% diameter stenosis proximal right ICA.  MRI-patchy acute ischemic nonhemorrhagic infarct involving the posterior limb of the right internal capsule right periatrial white matter and mesial right temporal lobe.  (right medial temporal lobe (MCA/PCA) and right optic radiation and PLIC (AchA) infarcts) 2D Echo EF 60 to 65% LDL 111 HgbA1c 6.4 VTE prophylaxis - SCDs No anticoagulation prior to admission, now on aspirin 81 mg daily and clopidogrel 75 mg daily for 3 months and then aspirin alone. Therapy recommendations: Acute inpatient rehab Disposition: Pending  Carotid stenosis Pt has been following in [place] for b/l carotid stenosis CTA head & neck 55% stenosis distal CCA on the right with 50% diameter stenosis proximal right ICA. Left ICA 30% stenosis but prominent atherosclerosis with soft plaques CUS right ICA 60-79% stenosis Current stroke likely related to right ICA stenosis Appreciate Dr. Johnsie Cancel for cardiac clearance  [facility] consult with Dr. Scot Dock Tentative plan for carotid endarterectomy on Monday   Hx stroke/TIA Discharged 12/10 from [facility] after a transient episode of left sided weakness (confirmed with daughter  and patient).  Patient was discharged on Plavix and aspirin.  Hypertension Home meds: Hydralazine 25 mg, Lopressor 25 mg, amlodipine 2.5 mg Stable on the higher end Long-term BP goal normotensive Increase Amlodipine 5 mg->10mg   Continue Lopressor 25 mg   Hyperlipidemia Home meds: Rosuvastatin 20 mg, resumed in hospital LDL 111, goal < 70 Rosuvastatin 40mg  Continue statin at discharge  Other Stroke Risk Factors Advanced Age >/= 74  Coronary artery disease Quit smoking 40 years ago  Other Active Problems BPH Leukocytosis WBC 12.9->11.1->10.0->10.1 Hypokalemia - K 3.4 - 3.8 Constipation - on senakot PRN, on colace bid, on milk of magnesium daily and will give duocolax supporsitory Insomnia - melatonin  Hospital day # 6  Patient seen and examined by NP/APP with MD. MD to update note as needed.   Janine Ores, DNP, FNP-BC [facility] Pager: [phone]  I have personally obtained history,examined this patient, reviewed notes, independently viewed imaging studies, participated  in medical decision making and plan of care.ROS completed by me personally and pertinent positives fully documented  I have made any additions or clarifications directly to the above note. Agree with note above.  Continue on dual antiplatelet therapy and aggressive risk factor modification.  Patient is awaiting elective right carotid revascularization next week.  We also appears to be at risk for sleep apnea and may benefit with possible consideration for participation in the sleep smart study for sleep apnea and stroke prevention.  Will be given information to review and decide.  Long discussion with patient and son at the bedside and answered questions.  Greater than 50% time during this 35-minute visit was spent in counseling and coordination of obstructive sleep apnea questions.  Antony Contras, MD Medical Director [facility] Pager: [phone] 12/18/2020 2:44 PM   To  contact Stroke Continuity provider, please refer to [email or web address] After hours, contact General Neurology

## 2020-12-19 NOTE — Progress Notes (Signed)
Inpatient Rehab Admissions Coordinator:  Insurance authorization received. Awaiting medical clearance and bed availability. Will continue to follow.  Gayland Curry, [place], [place] Admissions Coordinator [phone]

## 2020-12-19 NOTE — Progress Notes (Signed)
STROKE TEAM PROGRESS NOTE   INTERVAL HISTORY Multiple family members are at the bedside. Patient is sitting comfortably in the bedside chair.  He is scheduled to undergo carotid revascularization tomorrow by Dr. Doren Peck.  He is hemodynamically stable. No neurological changes.  He appears to be at risk for sleep apnea and is willing to consider possible participation in the sleep smart study but wants to wait a few days.  He still has significant left upper extremity weakness. Vitals:   12/18/20 2010 12/19/20 0244 12/19/20 0800 12/19/20 1210  BP: (!) 152/69 (!) 134/58 (!) 142/65 (!) 154/90  Pulse:   88 81  Resp: 17 15 20 17   Temp: 98.1 F (36.7 C) 98.1 F (36.7 C) 98.1 F (36.7 C) 98.1 F (36.7 C)  TempSrc: Oral Oral Oral Oral  SpO2: 96% 98% 97% 97%  Weight:      Height:       CBC:  Recent Labs  Lab 12/12/20 1745 12/12/20 1751 12/16/20 0049 12/18/20 0445  WBC 12.9*   < > 10.1 10.1  NEUTROABS 8.7*  --   --   --   HGB 13.1   < > 12.5* 13.7  HCT 40.6   < > 38.3* 40.3  MCV 83.9   < > 82.7 81.9  PLT 251   < > 217 240   < > = values in this interval not displayed.   Basic Metabolic Panel:  Recent Labs  Lab 12/16/20 0049 12/18/20 0445  NA 138 135  K 3.8 4.3  CL 107 103  CO2 24 26  GLUCOSE 116* 113*  BUN 9 13  CREATININE 1.00 1.08  CALCIUM 9.1 9.0   Lipid Panel:  Recent Labs  Lab 12/13/20 0449  CHOL 165  TRIG 28  HDL 48  CHOLHDL 3.4  VLDL 6  LDLCALC 111*   HgbA1c:  Recent Labs  Lab 12/13/20 0449  HGBA1C 6.4*   Urine Drug Screen: No results for input(s): LABOPIA, COCAINSCRNUR, LABBENZ, AMPHETMU, THCU, LABBARB in the last 168 hours.  Alcohol Level No results for input(s): ETH in the last 168 hours.  IMAGING past 24 hours No results found.  PHYSICAL EXAM  Temp:  [97.8 F (36.6 C)-98.1 F (36.7 C)] 98.1 F (36.7 C) (12/18 1210) Pulse Rate:  [81-88] 81 (12/18 1210) Resp:  [15-20] 17 (12/18 1210) BP: (134-154)/(58-90) 154/90 (12/18 1210) SpO2:  [95 %-98  %] 97 % (12/18 1210)  General -mildly obese elderly African-American male, in no apparent distress. Ophthalmologic - fundi not visualized due to noncooperation. Cardiovascular - Regular rhythm and rate. Mental Status -  Level of arousal and orientation to time, place, and person were intact. Language including expression, naming, repetition, comprehension was assessed and found intact.  Mild dysarthria. Attention span and concentration were normal. Recent and remote memory were intact. Fund of Knowledge was assessed and was intact.  Cranial Nerves II - XII - II -left upper patchy visual field deficit III, IV, VI - Extraocular movements intact. V - Facial sensation intact bilaterally. VII -facial symmetry intact VIII - Hearing & vestibular intact bilaterally. X - Palate elevates symmetrically. XI - Chin turning & shoulder shrug intact bilaterally. XII - Tongue protrusion intact.  Motor Strength -  Bulk was normal and fasciculations were absent.   Left upper extremity strength 3/5 with some effort against gravity, hand grip 1/5 Left lower extremity strength 4/5 with drift present Bilateral right extremities 5/5 Motor Tone - Muscle tone was assessed at the neck and appendages and  was normal.  Reflexes - The patients reflexes were symmetrical in all extremities and he had no pathological reflexes. Sensory - Light touch, temperature/pinprick were assessed and were symmetrical.   Coordination -no ataxia noted.  Patient has diminished fine motor control and decreased coordination in the upper extremities Gait and Station - deferred.  NIHSS 2 for LUE drift Premorbid MRS 0  ASSESSMENT/PLAN [name] is a 85 y.o. male with a past medical history significant for hypertension arthritis CAD who presents with left-sided weakness left facial droop and left hemianopsia.  Patient was seen at [facility] 12/10 for a 1 hour episode of left sided weakness. He was discharged 12/11  around 1330. Around 1700 he had a sudden onset of left-sided weakness.  EMS was called and he was brought to the emergency department.  Family states that left leg weakness had completely resolved.  When he was discharged from [facility] and he had no symptoms and he was discharged on Plavix. He received TNKase at 1753. CEA with Dr. Scot Peck on Monday and then plan to discharge to CIR once stable post procedure.   Stroke: scattered infarcts at right MCA/PCA border zone and right ACA territory s/p TNK , likely secondary to large vessel source from right ICA high grade stenosis.  Code Stroke CT head No acute abnormality. ASPECTS 10.    Repeat head CT-development of subtle low-density in the mesial temporal lobe, hippocampus, and inferior basal ganglia on the right side. CTA head & neck-negative for LVO, 55% stenosis distal CCA on the right with 50% diameter stenosis proximal right ICA.  MRI-patchy acute ischemic nonhemorrhagic infarct involving the posterior limb of the right internal capsule right periatrial white matter and mesial right temporal lobe.  (right medial temporal lobe (MCA/PCA) and right optic radiation and PLIC (AchA) infarcts) 2D Echo EF 60 to 65% LDL 111 HgbA1c 6.4 VTE prophylaxis - SCDs No anticoagulation prior to admission, now on aspirin 81 mg daily and clopidogrel 75 mg daily for 3 months and then aspirin alone. Therapy recommendations: Acute inpatient rehab Disposition: Pending  Carotid stenosis Pt has been following in [place] for b/l carotid stenosis CTA head & neck 55% stenosis distal CCA on the right with 50% diameter stenosis proximal right ICA. Left ICA 30% stenosis but prominent atherosclerosis with soft plaques CUS right ICA 60-79% stenosis Current stroke likely related to right ICA stenosis Appreciate Joel Peck for cardiac clearance  The [facility] consult with Dr. Scot Peck Tentative plan for carotid endarterectomy on Monday   Hx stroke/TIA Discharged 12/10 from  [facility] after a transient episode of left sided weakness (confirmed with daughter and patient).  Patient was discharged on Plavix and aspirin.  Hypertension Home meds: Hydralazine 25 mg, Lopressor 25 mg, amlodipine 2.5 mg Stable on the higher end Long-term BP goal normotensive Increase Amlodipine 5 mg->10mg   Continue Lopressor 25 mg   Hyperlipidemia Home meds: Rosuvastatin 20 mg, resumed in hospital LDL 111, goal < 70 Rosuvastatin 40mg  Continue statin at discharge  Other Stroke Risk Factors Advanced Age >/= 1  Coronary artery disease Quit smoking 40 years ago  Other Active Problems BPH Leukocytosis WBC 12.9->11.1->10.0->10.1 Hypokalemia - K 3.4 - 3.8 Constipation - on senakot PRN, on colace bid, on milk of magnesium daily and will give duocolax supporsitory Insomnia - melatonin  Hospital day # 7   Continue on dual antiplatelet therapy and aggressive risk factor modification.  Patient is awaiting elective right carotid revascularization tommorw by dr Joel Peck..  We also  appears to be at risk for sleep apnea and may benefit with possible consideration for participation in the sleep smart study if interested. Long discussion with patient and multiple family members  at the bedside and answered questions.  Greater than 50% time during this 25-minute visit was spent in counseling and coordination of obstructive sleep apnea questions.  Antony Contras, MD Medical Director [facility] Pager: [phone] 12/19/2020 3:36 PM   To contact Stroke Continuity provider, please refer to [email or web address] After hours, contact General Neurology

## 2020-12-19 NOTE — Progress Notes (Signed)
°  Progress Note    12/19/2020 11:34 AM Hospital Day 4  Subjective:  no complaints;says his left leg is a little bit better.    Afebrile HR 60's-80's 662'H-476'L systolic 46% RA  Vitals:   12/19/20 0244 12/19/20 0800  BP: (!) 134/58 (!) 142/65  Pulse:  88  Resp: 15 20  Temp: 98.1 F (36.7 C) 98.1 F (36.7 C)  SpO2: 98% 97%    Physical Exam: General:  no distress Lungs:  non labored Extremities:  left hand grip 3/5  CBC    Component Value Date/Time   WBC 10.1 12/18/2020 0445   RBC 4.92 12/18/2020 0445   HGB 13.7 12/18/2020 0445   HCT 40.3 12/18/2020 0445   PLT 240 12/18/2020 0445   MCV 81.9 12/18/2020 0445   MCH 27.8 12/18/2020 0445   MCHC 34.0 12/18/2020 0445   RDW 13.3 12/18/2020 0445   LYMPHSABS 3.4 12/12/2020 1745   MONOABS 0.7 12/12/2020 1745   EOSABS 0.1 12/12/2020 1745   BASOSABS 0.1 12/12/2020 1745    BMET    Component Value Date/Time   NA 135 12/18/2020 0445   K 4.3 12/18/2020 0445   CL 103 12/18/2020 0445   CO2 26 12/18/2020 0445   GLUCOSE 113 (H) 12/18/2020 0445   BUN 13 12/18/2020 0445   CREATININE 1.08 12/18/2020 0445   CALCIUM 9.0 12/18/2020 0445   GFRNONAA >60 12/18/2020 0445   GFRAA >60 07/04/2018 0310    INR    Component Value Date/Time   INR 1.0 12/12/2020 1745     Intake/Output Summary (Last 24 hours) at 12/19/2020 1134 Last data filed at 12/19/2020 0244 Gross per 24 hour  Intake 358 ml  Output 600 ml  Net -242 ml      Assessment/Plan:  85 y.o. male with symptomatic right carotid artery stenosis. Plan for RIGHT CEA tomorrow with Dr. Scot Dock.  Patient was doing well on exam, jovial, conversational.  Son and daughter in the room.  I answered all questions regarding carotid endarterectomy including the procedure, postoperative care, follow-up.  After discussing the risk and benefits of surgery, Javares elected to proceed. Please make n.p.o. tonight.    Broadus John MD [facility] [phone] 12/19/2020 11:34 AM

## 2020-12-19 NOTE — H&P (View-Only) (Signed)
°  Progress Note    12/19/2020 11:34 AM Hospital Day 4  Subjective:  no complaints;says his left leg is a little bit better.    Afebrile HR 60's-80's 962'E-366'Q systolic 94% RA  Vitals:   12/19/20 0244 12/19/20 0800  BP: (!) 134/58 (!) 142/65  Pulse:  88  Resp: 15 20  Temp: 98.1 F (36.7 C) 98.1 F (36.7 C)  SpO2: 98% 97%    Physical Exam: General:  no distress Lungs:  non labored Extremities:  left hand grip 3/5  CBC    Component Value Date/Time   WBC 10.1 12/18/2020 0445   RBC 4.92 12/18/2020 0445   HGB 13.7 12/18/2020 0445   HCT 40.3 12/18/2020 0445   PLT 240 12/18/2020 0445   MCV 81.9 12/18/2020 0445   MCH 27.8 12/18/2020 0445   MCHC 34.0 12/18/2020 0445   RDW 13.3 12/18/2020 0445   LYMPHSABS 3.4 12/12/2020 1745   MONOABS 0.7 12/12/2020 1745   EOSABS 0.1 12/12/2020 1745   BASOSABS 0.1 12/12/2020 1745    BMET    Component Value Date/Time   NA 135 12/18/2020 0445   K 4.3 12/18/2020 0445   CL 103 12/18/2020 0445   CO2 26 12/18/2020 0445   GLUCOSE 113 (H) 12/18/2020 0445   BUN 13 12/18/2020 0445   CREATININE 1.08 12/18/2020 0445   CALCIUM 9.0 12/18/2020 0445   GFRNONAA >60 12/18/2020 0445   GFRAA >60 07/04/2018 0310    INR    Component Value Date/Time   INR 1.0 12/12/2020 1745     Intake/Output Summary (Last 24 hours) at 12/19/2020 1134 Last data filed at 12/19/2020 0244 Gross per 24 hour  Intake 358 ml  Output 600 ml  Net -242 ml      Assessment/Plan:  85 y.o. male with symptomatic right carotid artery stenosis. Plan for RIGHT CEA tomorrow with Dr. Scot Dock.  Patient was doing well on exam, jovial, conversational.  Son and daughter in the room.  I answered all questions regarding carotid endarterectomy including the procedure, postoperative care, follow-up.  After discussing the risk and benefits of surgery, Joel Peck elected to proceed. Please make n.p.o. tonight.    Joel John MD [facility] [phone] 12/19/2020 11:34 AM

## 2020-12-20 ENCOUNTER — Encounter (HOSPITAL_COMMUNITY): Payer: Self-pay | Admitting: Neurology

## 2020-12-20 ENCOUNTER — Inpatient Hospital Stay (HOSPITAL_COMMUNITY): Admission: RE | Admit: 2020-12-20 | Payer: PPO | Source: Home / Self Care | Admitting: Vascular Surgery

## 2020-12-20 ENCOUNTER — Inpatient Hospital Stay (HOSPITAL_COMMUNITY): Payer: PPO

## 2020-12-20 ENCOUNTER — Inpatient Hospital Stay (HOSPITAL_COMMUNITY): Payer: PPO | Admitting: Certified Registered Nurse Anesthetist

## 2020-12-20 ENCOUNTER — Encounter (HOSPITAL_COMMUNITY)
Admission: EM | Disposition: A | Discharge status: Rehabilitation Facility | Payer: Self-pay | Source: Home / Self Care | Attending: Neurology

## 2020-12-20 DIAGNOSIS — I6521 Occlusion and stenosis of right carotid artery: Secondary | ICD-10-CM

## 2020-12-20 DIAGNOSIS — Z9889 Other specified postprocedural states: Secondary | ICD-10-CM | POA: Diagnosis not present

## 2020-12-20 HISTORY — PX: ENDARTERECTOMY: SHX5162

## 2020-12-20 HISTORY — PX: PATCH ANGIOPLASTY: SHX6230

## 2020-12-20 LAB — CBC
HCT: 39.9 % (ref 39.0–52.0)
Hemoglobin: 13 g/dL (ref 13.0–17.0)
MCH: 27 pg (ref 26.0–34.0)
MCHC: 32.6 g/dL (ref 30.0–36.0)
MCV: 82.8 fL (ref 80.0–100.0)
Platelets: 240 10*3/uL (ref 150–400)
RBC: 4.82 MIL/uL (ref 4.22–5.81)
RDW: 13.3 % (ref 11.5–15.5)
WBC: 12.1 10*3/uL — ABNORMAL HIGH (ref 4.0–10.5)
nRBC: 0 % (ref 0.0–0.2)

## 2020-12-20 LAB — BASIC METABOLIC PANEL
Anion gap: 8 (ref 5–15)
BUN: 11 mg/dL (ref 8–23)
CO2: 25 mmol/L (ref 22–32)
Calcium: 9 mg/dL (ref 8.9–10.3)
Chloride: 104 mmol/L (ref 98–111)
Creatinine, Ser: 0.93 mg/dL (ref 0.61–1.24)
GFR, Estimated: >60 mL/min (ref >60)
Glucose, Bld: 104 mg/dL — ABNORMAL HIGH (ref 70–99)
Potassium: 4.5 mmol/L (ref 3.5–5.1)
Sodium: 137 mmol/L (ref 135–145)

## 2020-12-20 LAB — POCT ACTIVATED CLOTTING TIME: Activated Clotting Time: 269 seconds

## 2020-12-20 LAB — TYPE AND SCREEN
ABO/RH(D): A POS
Antibody Screen: NEGATIVE

## 2020-12-20 SURGERY — ENDARTERECTOMY, CAROTID
Anesthesia: General | Site: Neck | Laterality: Right

## 2020-12-20 MED ORDER — ROCURONIUM BROMIDE 10 MG/ML (PF) SYRINGE
PREFILLED_SYRINGE | INTRAVENOUS | Status: DC | PRN
  Administered 2020-12-20: 60 mg via INTRAVENOUS

## 2020-12-20 MED ORDER — LIDOCAINE 2% (20 MG/ML) 5 ML SYRINGE
INTRAMUSCULAR | Status: AC
  Filled 2020-12-20: qty 10

## 2020-12-20 MED ORDER — GUAIFENESIN-DM 100-10 MG/5ML PO SYRP: 15.0000 mL | ORAL_SOLUTION | ORAL | Status: DC | PRN

## 2020-12-20 MED ORDER — CEFAZOLIN SODIUM 1 G IJ SOLR
INTRAMUSCULAR | Status: AC
  Filled 2020-12-20: qty 20

## 2020-12-20 MED ORDER — ESMOLOL HCL 100 MG/10ML IV SOLN
INTRAVENOUS | Status: AC
  Filled 2020-12-20: qty 10

## 2020-12-20 MED ORDER — ESMOLOL HCL 100 MG/10ML IV SOLN
INTRAVENOUS | Status: DC | PRN
  Administered 2020-12-20: 30 mg via INTRAVENOUS

## 2020-12-20 MED ORDER — HYDRALAZINE HCL 20 MG/ML IJ SOLN: 5.0000 mg | INTRAMUSCULAR | Status: DC | PRN

## 2020-12-20 MED ORDER — SODIUM CHLORIDE 0.9 % IV SOLN: INTRAVENOUS | Status: AC

## 2020-12-20 MED ORDER — CHLORHEXIDINE GLUCONATE 0.12 % MT SOLN: 15.0000 mL | Freq: Once | OROMUCOSAL | Status: AC

## 2020-12-20 MED ORDER — ONDANSETRON HCL 4 MG/2ML IJ SOLN
INTRAMUSCULAR | Status: DC | PRN
  Administered 2020-12-20: 4 mg via INTRAVENOUS

## 2020-12-20 MED ORDER — OXYCODONE-ACETAMINOPHEN 5-325 MG PO TABS
1.0000 | ORAL_TABLET | ORAL | Status: DC | PRN
  Administered 2020-12-20 – 2020-12-21 (×2): 2 via ORAL
  Filled 2020-12-20 (×2): qty 2

## 2020-12-20 MED ORDER — MAGNESIUM SULFATE 2 GM/50ML IV SOLN: 2.0000 g | Freq: Every day | INTRAVENOUS | Status: DC | PRN

## 2020-12-20 MED ORDER — PHENYLEPHRINE 40 MCG/ML (10ML) SYRINGE FOR IV PUSH (FOR BLOOD PRESSURE SUPPORT)
PREFILLED_SYRINGE | INTRAVENOUS | Status: DC | PRN
  Administered 2020-12-20: 80 ug via INTRAVENOUS
  Administered 2020-12-20: 120 ug via INTRAVENOUS

## 2020-12-20 MED ORDER — PROTAMINE SULFATE 10 MG/ML IV SOLN
INTRAVENOUS | Status: DC | PRN
  Administered 2020-12-20: 40 mg via INTRAVENOUS

## 2020-12-20 MED ORDER — ONDANSETRON HCL 4 MG/2ML IJ SOLN
INTRAMUSCULAR | Status: AC
  Filled 2020-12-20: qty 4

## 2020-12-20 MED ORDER — LACTATED RINGERS IV SOLN: INTRAVENOUS | Status: DC

## 2020-12-20 MED ORDER — METOPROLOL TARTRATE 5 MG/5ML IV SOLN: 2.0000 mg | INTRAVENOUS | Status: DC | PRN

## 2020-12-20 MED ORDER — PHENOL 1.4 % MT LIQD: 1.0000 | OROMUCOSAL | Status: DC | PRN

## 2020-12-20 MED ORDER — FENTANYL CITRATE (PF) 250 MCG/5ML IJ SOLN
INTRAMUSCULAR | Status: AC
  Filled 2020-12-20: qty 5

## 2020-12-20 MED ORDER — PROPOFOL 10 MG/ML IV BOLUS
INTRAVENOUS | Status: DC | PRN
  Administered 2020-12-20: 130 mg via INTRAVENOUS

## 2020-12-20 MED ORDER — CEFAZOLIN SODIUM-DEXTROSE 2-4 GM/100ML-% IV SOLN
2.0000 g | Freq: Three times a day (TID) | INTRAVENOUS | Status: AC
  Administered 2020-12-20 – 2020-12-21 (×2): 2 g via INTRAVENOUS
  Filled 2020-12-20 (×2): qty 100

## 2020-12-20 MED ORDER — POTASSIUM CHLORIDE CRYS ER 20 MEQ PO TBCR
20.0000 meq | EXTENDED_RELEASE_TABLET | Freq: Every day | ORAL | Status: DC | PRN
Start: 2020-12-20 — End: 2020-12-21

## 2020-12-20 MED ORDER — SODIUM CHLORIDE 0.9 % IV SOLN
0.1500 ug/kg/min | INTRAVENOUS | Status: DC
  Administered 2020-12-20: 12:00:00 .1 ug/kg/min via INTRAVENOUS
  Filled 2020-12-20: qty 2000

## 2020-12-20 MED ORDER — ROCURONIUM BROMIDE 10 MG/ML (PF) SYRINGE
PREFILLED_SYRINGE | INTRAVENOUS | Status: AC
  Filled 2020-12-20: qty 20

## 2020-12-20 MED ORDER — DEXTRAN 40 IN SALINE 10-0.9 % IV SOLN
INTRAVENOUS | Status: AC | PRN
  Administered 2020-12-20: 25 mL/h

## 2020-12-20 MED ORDER — HEPARIN 6000 UNIT IRRIGATION SOLUTION
Status: DC | PRN
  Administered 2020-12-20: 1

## 2020-12-20 MED ORDER — PHENYLEPHRINE HCL (PRESSORS) 10 MG/ML IV SOLN: INTRAVENOUS | Status: DC | PRN

## 2020-12-20 MED ORDER — LIDOCAINE 2% (20 MG/ML) 5 ML SYRINGE
INTRAMUSCULAR | Status: DC | PRN
  Administered 2020-12-20: 40 mg via INTRAVENOUS

## 2020-12-20 MED ORDER — LIDOCAINE-EPINEPHRINE (PF) 1 %-1:200000 IJ SOLN
INTRAMUSCULAR | Status: AC
  Filled 2020-12-20: qty 30

## 2020-12-20 MED ORDER — MORPHINE SULFATE (PF) 2 MG/ML IV SOLN: 2.0000 mg | INTRAVENOUS | Status: DC | PRN

## 2020-12-20 MED ORDER — HEPARIN 6000 UNIT IRRIGATION SOLUTION
Status: AC
  Filled 2020-12-20: qty 500

## 2020-12-20 MED ORDER — LACTATED RINGERS IV SOLN: INTRAVENOUS | Status: DC | PRN

## 2020-12-20 MED ORDER — ONDANSETRON HCL 4 MG/2ML IJ SOLN: 4.0000 mg | Freq: Four times a day (QID) | INTRAMUSCULAR | Status: DC | PRN

## 2020-12-20 MED ORDER — LIDOCAINE-EPINEPHRINE (PF) 1 %-1:200000 IJ SOLN
INTRAMUSCULAR | Status: DC | PRN
  Administered 2020-12-20: 10 mL

## 2020-12-20 MED ORDER — THROMBIN (RECOMBINANT) 20000 UNITS EX SOLR
CUTANEOUS | Status: AC
  Filled 2020-12-20: qty 20000

## 2020-12-20 MED ORDER — ALUM & MAG HYDROXIDE-SIMETH 200-200-20 MG/5ML PO SUSP: 15.0000 mL | ORAL | Status: DC | PRN

## 2020-12-20 MED ORDER — REMIFENTANIL HCL 1 MG IV SOLR: INTRAVENOUS | Status: DC | PRN

## 2020-12-20 MED ORDER — ACETAMINOPHEN 500 MG PO TABS
1000.0000 mg | ORAL_TABLET | Freq: Once | ORAL | Status: AC
  Administered 2020-12-20: 12:00:00 1000 mg via ORAL
  Filled 2020-12-20: qty 2

## 2020-12-20 MED ORDER — SODIUM CHLORIDE 0.9 % IV SOLN: 500.0000 mL | Freq: Once | INTRAVENOUS | Status: DC | PRN

## 2020-12-20 MED ORDER — FENTANYL CITRATE (PF) 250 MCG/5ML IJ SOLN
INTRAMUSCULAR | Status: DC | PRN
  Administered 2020-12-20: 150 ug via INTRAVENOUS

## 2020-12-20 MED ORDER — LIDOCAINE HCL (PF) 1 % IJ SOLN
INTRAMUSCULAR | Status: AC
  Filled 2020-12-20: qty 30

## 2020-12-20 MED ORDER — PHENYLEPHRINE HCL-NACL 20-0.9 MG/250ML-% IV SOLN
INTRAVENOUS | Status: DC | PRN
  Administered 2020-12-20: 60 ug/min via INTRAVENOUS

## 2020-12-20 MED ORDER — CHLORHEXIDINE GLUCONATE 0.12 % MT SOLN
OROMUCOSAL | Status: AC
  Administered 2020-12-20: 12:00:00 15 mL via OROMUCOSAL
  Filled 2020-12-20: qty 15

## 2020-12-20 MED ORDER — SUGAMMADEX SODIUM 200 MG/2ML IV SOLN
INTRAVENOUS | Status: DC | PRN
  Administered 2020-12-20: 200 mg via INTRAVENOUS

## 2020-12-20 MED ORDER — ORAL CARE MOUTH RINSE: 15.0000 mL | Freq: Once | OROMUCOSAL | Status: AC

## 2020-12-20 MED ORDER — HEPARIN SODIUM (PORCINE) 1000 UNIT/ML IJ SOLN
INTRAMUSCULAR | Status: DC | PRN
  Administered 2020-12-20: 8000 [IU] via INTRAVENOUS

## 2020-12-20 MED ORDER — EPHEDRINE SULFATE-NACL 50-0.9 MG/10ML-% IV SOSY
PREFILLED_SYRINGE | INTRAVENOUS | Status: DC | PRN
  Administered 2020-12-20 (×2): 5 mg via INTRAVENOUS
  Administered 2020-12-20: 15 mg via INTRAVENOUS

## 2020-12-20 MED ORDER — FENTANYL CITRATE (PF) 100 MCG/2ML IJ SOLN
25.0000 ug | INTRAMUSCULAR | Status: DC | PRN
  Administered 2020-12-20 (×2): 25 ug via INTRAVENOUS

## 2020-12-20 MED ORDER — PHENYLEPHRINE 40 MCG/ML (10ML) SYRINGE FOR IV PUSH (FOR BLOOD PRESSURE SUPPORT)
PREFILLED_SYRINGE | INTRAVENOUS | Status: AC
  Filled 2020-12-20: qty 20

## 2020-12-20 MED ORDER — FENTANYL CITRATE (PF) 100 MCG/2ML IJ SOLN
INTRAMUSCULAR | Status: AC
  Filled 2020-12-20: qty 2

## 2020-12-20 MED ORDER — 0.9 % SODIUM CHLORIDE (POUR BTL) OPTIME
TOPICAL | Status: DC | PRN
  Administered 2020-12-20: 13:00:00 2000 mL

## 2020-12-20 MED ORDER — DEXAMETHASONE SODIUM PHOSPHATE 10 MG/ML IJ SOLN
INTRAMUSCULAR | Status: DC | PRN
  Administered 2020-12-20: 5 mg via INTRAVENOUS

## 2020-12-20 SURGICAL SUPPLY — 46 items
ADH SKN CLS APL DERMABOND .7 (GAUZE/BANDAGES/DRESSINGS) ×2
BAG COUNTER SPONGE SURGICOUNT (BAG) ×4 IMPLANT
BAG DECANTER FOR FLEXI CONT (MISCELLANEOUS) ×4 IMPLANT
BAG SPNG CNTER NS LX DISP (BAG) ×2
CANISTER SUCT 3000ML PPV (MISCELLANEOUS) ×4 IMPLANT
CANNULA VESSEL 3MM 2 BLNT TIP (CANNULA) ×12 IMPLANT
CATH ROBINSON RED A/P 18FR (CATHETERS) ×4 IMPLANT
CLIP VESOCCLUDE MED 24/CT (CLIP) ×4 IMPLANT
CLIP VESOCCLUDE SM WIDE 24/CT (CLIP) ×4 IMPLANT
DERMABOND ADVANCED (GAUZE/BANDAGES/DRESSINGS) ×1
DERMABOND ADVANCED .7 DNX12 (GAUZE/BANDAGES/DRESSINGS) ×3 IMPLANT
DRAIN CHANNEL 15F RND FF W/TCR (WOUND CARE) IMPLANT
ELECT REM PT RETURN 9FT ADLT (ELECTROSURGICAL) ×3
ELECTRODE REM PT RTRN 9FT ADLT (ELECTROSURGICAL) ×3 IMPLANT
EVACUATOR SILICONE 100CC (DRAIN) IMPLANT
GLOVE SRG 8 PF TXTR STRL LF DI (GLOVE) ×3 IMPLANT
GLOVE SURG ENC MOIS LTX SZ7.5 (GLOVE) ×4 IMPLANT
GLOVE SURG POLY ORTHO LF SZ7.5 (GLOVE) IMPLANT
GLOVE SURG UNDER LTX SZ8 (GLOVE) ×4 IMPLANT
GLOVE SURG UNDER POLY LF SZ8 (GLOVE) ×3
GOWN STRL REUS W/ TWL LRG LVL3 (GOWN DISPOSABLE) ×9 IMPLANT
GOWN STRL REUS W/TWL LRG LVL3 (GOWN DISPOSABLE) ×9
KIT BASIN OR (CUSTOM PROCEDURE TRAY) ×4 IMPLANT
KIT SHUNT ARGYLE CAROTID ART 6 (VASCULAR PRODUCTS) ×1 IMPLANT
KIT TURNOVER KIT B (KITS) ×4 IMPLANT
NDL HYPO 25GX1X1/2 BEV (NEEDLE) ×3 IMPLANT
NEEDLE HYPO 25GX1X1/2 BEV (NEEDLE) ×3 IMPLANT
NS IRRIG 1000ML POUR BTL (IV SOLUTION) ×8 IMPLANT
PACK CAROTID (CUSTOM PROCEDURE TRAY) ×4 IMPLANT
PAD ARMBOARD 7.5X6 YLW CONV (MISCELLANEOUS) ×8 IMPLANT
PATCH VASC XENOSURE 1CMX6CM (Vascular Products) ×3 IMPLANT
PATCH VASC XENOSURE 1X6 (Vascular Products) IMPLANT
POSITIONER HEAD DONUT 9IN (MISCELLANEOUS) ×4 IMPLANT
SET WALTER ACTIVATION W/DRAPE (SET/KITS/TRAYS/PACK) IMPLANT
SHUNT CAROTID BYPASS 10 (VASCULAR PRODUCTS) IMPLANT
SHUNT CAROTID BYPASS 12 (VASCULAR PRODUCTS) IMPLANT
SPONGE SURGIFOAM ABS GEL 100 (HEMOSTASIS) IMPLANT
SUT MNCRL AB 4-0 PS2 18 (SUTURE) ×4 IMPLANT
SUT PROLENE 6 0 BV (SUTURE) ×9 IMPLANT
SUT SILK 2 0 PERMA HAND 18 BK (SUTURE) IMPLANT
SUT VIC AB 3-0 SH 27 (SUTURE) ×3
SUT VIC AB 3-0 SH 27X BRD (SUTURE) ×3 IMPLANT
SYR 20ML LL LF (SYRINGE) ×4 IMPLANT
SYR CONTROL 10ML LL (SYRINGE) ×4 IMPLANT
TOWEL GREEN STERILE (TOWEL DISPOSABLE) ×4 IMPLANT
WATER STERILE IRR 1000ML POUR (IV SOLUTION) ×4 IMPLANT

## 2020-12-20 NOTE — Progress Notes (Signed)
Pt arrived to 4E from PACU. CHG bath given. Tele applied and CCMD called. VSS. Pt A&O. Oriented to room and call light is within reach.  Raelyn Number, RN

## 2020-12-20 NOTE — Progress Notes (Signed)
PT Cancellation Note  Patient Details Name: Joel Peck MRN: [id number] DOB: 09/15/1935   Cancelled Treatment:    Reason Eval/Treat Not Completed: Patient at procedure or test/unavailable (right carotid endarterectomy)   [name], PT, DPT Acute Rehabilitation Services Pager [phone] Office [phone]    Deno Etienne 12/20/2020, 11:30 AM

## 2020-12-20 NOTE — Anesthesia Procedure Notes (Signed)
Procedure Name: Intubation Date/Time: 12/20/2020 12:25 PM Performed by: Janace Litten, CRNA Pre-anesthesia Checklist: Patient identified, Emergency Drugs available, Suction available and Patient being monitored Patient Re-evaluated:Patient Re-evaluated prior to induction Oxygen Delivery Method: Circle System Utilized Preoxygenation: Pre-oxygenation with 100% oxygen Induction Type: IV induction Ventilation: Mask ventilation without difficulty and Oral airway inserted - appropriate to patient size Laryngoscope Size: 4 and Mac Grade View: Grade I Tube type: Oral Tube size: 7.5 mm Number of attempts: 1 Airway Equipment and Method: Stylet and Oral airway Placement Confirmation: ETT inserted through vocal cords under direct vision, positive ETCO2 and breath sounds checked- equal and bilateral Secured at: 22 cm Tube secured with: Tape Dental Injury: Teeth and Oropharynx as per pre-operative assessment

## 2020-12-20 NOTE — Op Note (Signed)
NAME: Joel Peck    MRN: [id number] DOB: Sep 15, 1935    DATE OF OPERATION: 12/20/2020  PREOP DIAGNOSIS:    Symptomatic right carotid stenosis  POSTOP DIAGNOSIS:    Same  PROCEDURE:    Right carotid endarterectomy with bovine pericardial patch angioplasty Ligation of right external carotid artery  SURGEON: Judeth Cornfield. Scot Dock, MD  ASSIST: Arlee Muslim, PA  ANESTHESIA: General  EBL: Minimal  INDICATIONS:    Joel Peck is a 85 y.o. male who presented with a right brain stroke and was found to have a 60 to 79% right carotid stenosis by duplex.  He had no significant stenosis on the left.  Right carotid endarterectomy was recommended in order to lower his risk of future stroke.  He had profound left upper extremity weakness and significant left lower extremity weakness preop.  FINDINGS:   70% right carotid stenosis.  The plaque was significantly adherent to the proximal external carotid artery.  The backwall of the external carotid artery was unstable and for this reason the external carotid artery was excised and then ligated.  TECHNIQUE:   The patient was taken to the operating room after an arterial line was placed by anesthesia.  The patient received a general anesthetic.  The right neck was prepped and draped in the usual sterile fashion.  A longitudinal incision was made along the anterior border the sternocleidomastoid and the dissection carried down to the common carotid artery which was dissected free and controlled with Rummel tourniquet.  The patient was heparinized.  ACT was monitored throughout the procedure.  The facial vein was divided between 2-0 silk ties.  The internal carotid artery was controlled above the plaque and controlled with a red vessel loop.  The external carotid artery was controlled with a blue loop.  First the internal carotid artery was clamped and then the external and common carotid arteries were clamped.  An endarterectomy plane was  established proximally and the plaque was sharply divided.  Eversion endarterectomy was performed of the external carotid artery however the plaque was significantly adherent to the back wall and the back wall was disrupted.  Rather than try to repair this I felt it would be better to excise the external carotid artery and ligate this to allow for a smoother lumen.  Distally there was a nice tapering the plaque and 1 tacking suture was placed with a 6-0 Prolene.  The artery was irrigated with copious amounts of heparin and dextran and all loose debris removed.  A bovine pericardial patch was then sewn using continuous 6-0 Prolene suture.  Prior to completing the patch closure the artery was backbled and flushed appropriately and the anastomosis completed.  Flow was reestablished to the internal carotid artery and there was an excellent Doppler signal with diastolic flow and a good pulse.  Hemostasis was obtained in the wound.  The heparin was partially reversed with protamine.  The wound was then closed with a deep layer of 3-0 Vicryl.  The platysma was closed with running 3-0 Vicryl.  The skin was closed with a 4-0 Monocryl.  Dermabond was applied.  The patient awoke with good strength in the right side he had his preoperative weakness on the left.  He was transferred to the recovery room in stable condition.  All needle and sponge counts were correct.  Given the complexity of the case a first assistant was necessary in order to expedient the procedure and safely perform the technical aspects of the  operation.  Deitra Mayo, MD, FACS [facility]  DATE OF DICTATION:   12/20/2020

## 2020-12-20 NOTE — Anesthesia Preprocedure Evaluation (Addendum)
Anesthesia Evaluation  Patient identified by MRN, date of birth, ID band Patient awake    Reviewed: Allergy & Precautions, NPO status , Patient's Chart, lab work & pertinent test results, reviewed documented beta blocker date and time   History of Anesthesia Complications Negative for: history of anesthetic complications  Airway Mallampati: I  TM Distance: >3 FB Neck ROM: Full    Dental  (+) Edentulous Lower, Edentulous Upper   Pulmonary former smoker,    breath sounds clear to auscultation       Cardiovascular hypertension, Pt. on medications and Pt. on home beta blockers (-) angina+ CAD and + Peripheral Vascular Disease   Rhythm:Regular Rate:Normal  12/11/2020 ECHO: EF 60-65%, no regional wall motion abnormalities, impaired relaxation, no significant valvular abnormalities   Neuro/Psych CVA (L sided weakness), Residual Symptoms    GI/Hepatic Neg liver ROS, GERD  Medicated and Controlled,  Endo/Other  negative endocrine ROS  Renal/GU negative Renal ROS     Musculoskeletal  (+) Arthritis ,   Abdominal   Peds  Hematology plavix   Anesthesia Other Findings   Reproductive/Obstetrics                            Anesthesia Physical Anesthesia Plan  ASA: 3  Anesthesia Plan: General   Post-op Pain Management: Tylenol PO (pre-op) and Minimal or no pain anticipated   Induction: Intravenous  PONV Risk Score and Plan: 2 and Ondansetron and Dexamethasone  Airway Management Planned: Oral ETT  Additional Equipment: Arterial line  Intra-op Plan:   Post-operative Plan: Extubation in OR  Informed Consent: I have reviewed the patients History and Physical, chart, labs and discussed the procedure including the risks, benefits and alternatives for the proposed anesthesia with the patient or authorized representative who has indicated his/her understanding and acceptance.       Plan Discussed  with: CRNA and Surgeon  Anesthesia Plan Comments:        Anesthesia Quick Evaluation

## 2020-12-20 NOTE — Progress Notes (Addendum)
STROKE TEAM PROGRESS NOTE   INTERVAL HISTORY Patient is seen in PACU.  He had elective right carotid endarterectomy done by Dr. Doren Custard earlier today.  Postprocedure he was noted having increased left-sided weakness which appears to be improving.  Stat carotid ultrasound was done on the right side which showed patent right ICA flow.  Blood pressure has been little soft but appears okay at the moment. Vitals:   12/20/20 1515 12/20/20 1530 12/20/20 1545 12/20/20 1600  BP: (!) 116/53 (!) 95/35 (!) 102/36   Pulse: 82 67 63 64  Resp: 20 16 17 16   Temp:      TempSrc:      SpO2: 96% 92% 91% 91%  Weight:      Height:       CBC:  Recent Labs  Lab 12/18/20 0445 12/20/20 0510  WBC 10.1 12.1*  HGB 13.7 13.0  HCT 40.3 39.9  MCV 81.9 82.8  PLT 240 235   Basic Metabolic Panel:  Recent Labs  Lab 12/18/20 0445 12/20/20 0510  NA 135 137  K 4.3 4.5  CL 103 104  CO2 26 25  GLUCOSE 113* 104*  BUN 13 11  CREATININE 1.08 0.93  CALCIUM 9.0 9.0   Lipid Panel:  No results for input(s): CHOL, TRIG, HDL, CHOLHDL, VLDL, LDLCALC in the last 168 hours.  HgbA1c:  No results for input(s): HGBA1C in the last 168 hours.  Urine Drug Screen: No results for input(s): LABOPIA, COCAINSCRNUR, LABBENZ, AMPHETMU, THCU, LABBARB in the last 168 hours.  Alcohol Level No results for input(s): ETH in the last 168 hours.  IMAGING past 24 hours VAS US CAROTID  Result Date: 12/20/2020 Carotid Arterial Duplex Study Patient Name:  Joel Peck  Date of Exam:   12/20/2020 Medical Rec #: [id number]       Accession #:    [id number] Date of Birth: 08-31-1935       Patient Gender: M Patient Age:   85 years Exam Location:  [facility] Procedure:      VAS US CAROTID Referring Phys: Harrell Gave DICKSON --------------------------------------------------------------------------------  Indications:       Check patency post surgical procedure. Other Factors:     12/20/2020 - RIGHT CAROTID ENDARTERECTOMY                      PATCH ANGIOPLASTY USING XENOSURE BIOLOGIC PATCH. Comparison Study:  12/15/2020 - Right Carotid: Velocities in the right ICA are                    consistent with a 60-79% stenosis. Non-hemodynamically                    significant plaque <50% noted in the CCA. The ECA appears                    >50% stenosed.                     Left Carotid: Velocities in the left ICA are consistent with                    a 1-39%                    stenosis. Non-hemodynamically significant plaque <50% noted                    in the CCA. The ECA appears >50% stenosed.  Vertebrals: Bilateral vertebral arteries demonstrate                    antegrade flow.                    Subclavians: Normal flow hemodynamics were seen in bilateral                    subclavian arteries. Performing Technologist: Oliver Hum RVT  Examination Guidelines: A complete evaluation includes B-mode imaging, spectral Doppler, color Doppler, and power Doppler as needed of all accessible portions of each vessel. Bilateral testing is considered an integral part of a complete examination. Limited examinations for reoccurring indications may be performed as noted.  Right Carotid Findings: +----------+--------+--------+--------+-----------------------+--------+             PSV cm/s EDV cm/s Stenosis Plaque Description      Comments  +----------+--------+--------+--------+-----------------------+--------+  CCA Prox   80       12                smooth and heterogenous           +----------+--------+--------+--------+-----------------------+--------+  CCA Distal 37       7                 smooth and heterogenous           +----------+--------+--------+--------+-----------------------+--------+  ICA Prox   55       11                smooth and heterogenous           +----------+--------+--------+--------+-----------------------+--------+  ICA Distal 73       17                                                   +----------+--------+--------+--------+-----------------------+--------+ +---------+--------+--+--------+--+---------+  Vertebral PSV cm/s 69 EDV cm/s 11 Antegrade  +---------+--------+--+--------+--+---------+   Summary: Right Carotid: The right ICA appears patent.  *See table(s) above for measurements and observations.     Preliminary     PHYSICAL EXAM  Temp:  [98.1 F (36.7 C)-98.6 F (37 C)] 98.6 F (37 C) (12/19 1430) Pulse Rate:  [63-105] 64 (12/19 1600) Resp:  [15-20] 16 (12/19 1600) BP: (95-161)/(35-69) 102/36 (12/19 1545) SpO2:  [90 %-100 %] 91 % (12/19 1600) Arterial Line BP: (127-147)/(41-47) 138/47 (12/19 1600) Weight:  [79 kg] 79 kg (12/19 1117)  General -mildly obese elderly African-American male, in no apparent distress. Ophthalmologic - fundi not visualized due to noncooperation. Cardiovascular - Regular rhythm and rate. Mental Status -  Level of arousal and orientation to time, place, and person were intact. Language including expression, naming, repetition, comprehension was assessed and found intact.  Mild dysarthria. Attention span and concentration were normal. Recent and remote memory were intact. Fund of Knowledge was assessed and was intact.  Cranial Nerves II - XII - II -left upper patchy visual field deficit III, IV, VI - Extraocular movements intact. V - Facial sensation intact bilaterally. VII -facial symmetry intact VIII - Hearing & vestibular intact bilaterally. X - Palate elevates symmetrically. XI - Chin turning & shoulder shrug intact bilaterally. XII - Tongue protrusion intact.  Motor Strength -  Bulk was normal and fasciculations were absent.   Left upper extremity strength 3/5 with some effort against gravity, hand  grip 1/5 Left lower extremity strength 4/5 with drift present Bilateral right extremities 5/5 Motor Tone - Muscle tone was assessed at the neck and appendages and was normal.  Reflexes - The patients reflexes were symmetrical in  all extremities and he had no pathological reflexes. Sensory - Light touch, temperature/pinprick were assessed and were symmetrical.   Coordination -no ataxia noted.  Patient has diminished fine motor control and decreased coordination in the upper extremities Gait and Station - deferred.     ASSESSMENT/PLAN Joel Peck is a 85 y.o. male with a past medical history significant for hypertension arthritis CAD who presents with left-sided weakness left facial droop and left hemianopsia.  Patient was seen at [facility] 12/10 for a 1 hour episode of left sided weakness. He was discharged 12/11 around 1330. Around 1700 he had a sudden onset of left-sided weakness.  EMS was called and he was brought to the emergency department.  Family states that left leg weakness had completely resolved.  When he was discharged from [facility] and he had no symptoms and he was discharged on Plavix. He received TNKase at 1753. CEA with Dr. Scot Dock on Monday and then plan to discharge to CIR once stable post procedure.   Stroke: scattered infarcts at right MCA/PCA border zone and right ACA territory s/p TNK , likely secondary to large vessel source from right ICA high grade stenosis.  Code Stroke CT head No acute abnormality. ASPECTS 10.    Repeat head CT-development of subtle low-density in the mesial temporal lobe, hippocampus, and inferior basal ganglia on the right side. CTA head & neck-negative for LVO, 55% stenosis distal CCA on the right with 50% diameter stenosis proximal right ICA.  MRI-patchy acute ischemic nonhemorrhagic infarct involving the posterior limb of the right internal capsule right periatrial white matter and mesial right temporal lobe.  (right medial temporal lobe (MCA/PCA) and right optic radiation and PLIC (AchA) infarcts) 2D Echo EF 60 to 65% LDL 111 HgbA1c 6.4 VTE prophylaxis - SCDs No anticoagulation prior to admission, now on aspirin 81 mg daily and clopidogrel 75 mg  daily for 3 months and then aspirin alone. Therapy recommendations: Acute inpatient rehab Disposition: Pending  Carotid stenosis Pt has been following in [place] for b/l carotid stenosis CTA head & neck 55% stenosis distal CCA on the right with 50% diameter stenosis proximal right ICA. Left ICA 30% stenosis but prominent atherosclerosis with soft plaques CUS right ICA 60-79% stenosis Current stroke likely related to right ICA stenosis Appreciate Dr. Johnsie Cancel for cardiac clearance  The [facility] consult with Dr. Scot Dock Tentative plan for carotid endarterectomy on Monday   Hx stroke/TIA Discharged 12/10 from [facility] after a transient episode of left sided weakness (confirmed with daughter and patient).  Patient was discharged on Plavix and aspirin.  Hypertension Home meds: Hydralazine 25 mg, Lopressor 25 mg, amlodipine 2.5 mg Stable on the higher end Long-term BP goal normotensive Increase Amlodipine 5 mg->10mg   Continue Lopressor 25 mg   Hyperlipidemia Home meds: Rosuvastatin 20 mg, resumed in hospital LDL 111, goal < 70 Rosuvastatin 40mg  Continue statin at discharge  Other Stroke Risk Factors Advanced Age >/= 84  Coronary artery disease Quit smoking 40 years ago  Other Active Problems BPH Leukocytosis WBC 12.9->11.1->10.0->10.1 Hypokalemia - K 3.4 - 3.8 Constipation - on senakot PRN, on colace bid, on milk of magnesium daily and will give duocolax supporsitory Insomnia - melatonin  Hospital day # 8  Patient had transient left-sided worsening  of weakness which appears to have improved.  Stat carotid ultrasound shows patent right endarterectomy site.  Recommend bedrest and IV fluids and keep systolic at least 471-855 range and above.  Continue on dual antiplatelet therapy and aggressive risk factor modification.  He also appears to be at risk for sleep apnea and may benefit with possible consideration for participation in the sleep smart study if interested. Long  discussion with patient and Dr. Doren Custard at the bedside and answered questions.  Greater than 50% time during this 35-minute visit was spent in counseling and coordination of obstructive sleep apnea questions.  Antony Contras, MD Medical Director [facility] Pager: [phone] 12/20/2020 4:07 PM   To contact Stroke Continuity provider, please refer to [email or web address] After hours, contact General Neurology

## 2020-12-20 NOTE — Transfer of Care (Signed)
Immediate Anesthesia Transfer of Care Note  Patient: Joel Peck  Procedure(s) Performed: RIGHT CAROTID ENDARTERECTOMY (Right) PATCH ANGIOPLASTY USING XENOSURE BIOLOGIC PATCH (Right: Neck)  Patient Location: PACU  Anesthesia Type:General  Level of Consciousness: drowsy, patient cooperative and responds to stimulation  Airway & Oxygen Therapy: Patient Spontanous Breathing  Post-op Assessment: Report given to RN and Post -op Vital signs reviewed and stable  Post vital signs: Reviewed and stable  Last Vitals:  Vitals Value Taken Time  BP 121/53 12/20/20 1430  Temp    Pulse 81 12/20/20 1431  Resp 12 12/20/20 1431  SpO2 94 % 12/20/20 1431  Vitals shown include unvalidated device data.  Last Pain:  Vitals:   12/20/20 1148  TempSrc:   PainSc: 0-No pain      Patients Stated Pain Goal: 0 (09/73/53 2992)  Complications: No notable events documented.

## 2020-12-20 NOTE — Progress Notes (Addendum)
VASCULAR SURGERY POSTOP:  The patient is alert and oriented with good strength on the right side.  However he has had minimal movement on the left.  He did have a profound left-sided weakness preop.  I obtained a stat duplex of his neck which shows that his carotid endarterectomy site is widely patent.  I have spoken to Dr. Leonie Man who will be by to check on him.   Gae Gallop, MD 3:19 PM  Addendum: Neuro exam now appears back to baseline. Pt examined by Dr. Leonie Man.

## 2020-12-20 NOTE — Anesthesia Procedure Notes (Signed)
Arterial Line Insertion Start/End12/19/2022 11:45 AM Performed by: Janace Litten, CRNA, CRNA  Patient location: Pre-op. Preanesthetic checklist: patient identified, IV checked, risks and benefits discussed, surgical consent, monitors and equipment checked and pre-op evaluation Lidocaine 1% used for infiltration Left, radial was placed Catheter size: 20 G Hand hygiene performed   Attempts: 1 Procedure performed without using ultrasound guided technique. Following insertion, dressing applied and Biopatch. Post procedure assessment: normal  Patient tolerated the procedure well with no immediate complications.

## 2020-12-20 NOTE — Plan of Care (Signed)
°  Problem: Education: Goal: Knowledge of disease or condition will improve 12/20/2020 0059 by Maryan Puls, RN Outcome: Progressing 12/20/2020 0057 by Maryan Puls, RN Outcome: Progressing   Problem: Education: Goal: Knowledge of secondary prevention will improve (SELECT ALL) Outcome: Progressing Goal: Knowledge of patient specific risk factors will improve (INDIVIDUALIZE FOR PATIENT) Outcome: Progressing Goal: Individualized Educational Video(s) Outcome: Progressing   Problem: Coping: Goal: Will verbalize positive feelings about self Outcome: Progressing

## 2020-12-20 NOTE — Plan of Care (Signed)
°  Problem: Education: Goal: Knowledge of disease or condition will improve Outcome: Progressing   Problem: Nutrition: Goal: Risk of aspiration will decrease Outcome: Progressing   Problem: Education: Goal: Knowledge of secondary prevention will improve (SELECT ALL) Outcome: Progressing Goal: Knowledge of patient specific risk factors will improve (INDIVIDUALIZE FOR PATIENT) Outcome: Progressing Goal: Individualized Educational Video(s) Outcome: Progressing   Problem: Coping: Goal: Will verbalize positive feelings about self Outcome: Progressing

## 2020-12-20 NOTE — Progress Notes (Signed)
Right carotid artery duplex has been completed. Preliminary results can be found in CV Proc through chart review.   12/20/20 3:44 PM Joel Peck RVT

## 2020-12-20 NOTE — Interval H&P Note (Signed)
History and Physical Interval Note:  12/20/2020 10:34 AM  Doran Heater  has presented today for surgery, with the diagnosis of Symptomatic right carotid stenosis.  The various methods of treatment have been discussed with the patient and family. After consideration of risks, benefits and other options for treatment, the patient has consented to  Procedure(s): RIGHT CAROTID ENDARTERECTOMY (Right) as a surgical intervention.  The patient's history has been reviewed, patient examined, no change in status, stable for surgery.  I have reviewed the patient's chart and labs.  Questions were answered to the patient's satisfaction.     Deitra Mayo

## 2020-12-21 ENCOUNTER — Inpatient Hospital Stay (HOSPITAL_COMMUNITY)
Admission: RE | Admit: 2020-12-21 | Discharge: 2021-01-11 | DRG: 057 | Disposition: A | Discharge status: Home Health | Payer: PPO | Source: Intra-hospital | Attending: Physical Medicine and Rehabilitation | Admitting: Physical Medicine and Rehabilitation

## 2020-12-21 ENCOUNTER — Encounter (HOSPITAL_COMMUNITY): Payer: Self-pay | Admitting: Vascular Surgery

## 2020-12-21 ENCOUNTER — Other Ambulatory Visit: Payer: Self-pay

## 2020-12-21 ENCOUNTER — Encounter (HOSPITAL_COMMUNITY): Payer: Self-pay | Admitting: Physical Medicine and Rehabilitation

## 2020-12-21 DIAGNOSIS — I82442 Acute embolism and thrombosis of left tibial vein: Secondary | ICD-10-CM | POA: Diagnosis not present

## 2020-12-21 DIAGNOSIS — Z8249 Family history of ischemic heart disease and other diseases of the circulatory system: Secondary | ICD-10-CM | POA: Diagnosis not present

## 2020-12-21 DIAGNOSIS — M21372 Foot drop, left foot: Secondary | ICD-10-CM | POA: Diagnosis present

## 2020-12-21 DIAGNOSIS — I69390 Apraxia following cerebral infarction: Secondary | ICD-10-CM

## 2020-12-21 DIAGNOSIS — I63511 Cerebral infarction due to unspecified occlusion or stenosis of right middle cerebral artery: Secondary | ICD-10-CM | POA: Diagnosis not present

## 2020-12-21 DIAGNOSIS — K5909 Other constipation: Secondary | ICD-10-CM | POA: Diagnosis not present

## 2020-12-21 DIAGNOSIS — G4700 Insomnia, unspecified: Secondary | ICD-10-CM | POA: Diagnosis not present

## 2020-12-21 DIAGNOSIS — N179 Acute kidney failure, unspecified: Secondary | ICD-10-CM | POA: Diagnosis not present

## 2020-12-21 DIAGNOSIS — Z823 Family history of stroke: Secondary | ICD-10-CM | POA: Diagnosis not present

## 2020-12-21 DIAGNOSIS — I69392 Facial weakness following cerebral infarction: Secondary | ICD-10-CM

## 2020-12-21 DIAGNOSIS — R059 Cough, unspecified: Secondary | ICD-10-CM | POA: Diagnosis not present

## 2020-12-21 DIAGNOSIS — N400 Enlarged prostate without lower urinary tract symptoms: Secondary | ICD-10-CM | POA: Diagnosis not present

## 2020-12-21 DIAGNOSIS — Z79899 Other long term (current) drug therapy: Secondary | ICD-10-CM

## 2020-12-21 DIAGNOSIS — Z96642 Presence of left artificial hip joint: Secondary | ICD-10-CM | POA: Diagnosis present

## 2020-12-21 DIAGNOSIS — H5347 Heteronymous bilateral field defects: Secondary | ICD-10-CM | POA: Diagnosis not present

## 2020-12-21 DIAGNOSIS — I69319 Unspecified symptoms and signs involving cognitive functions following cerebral infarction: Secondary | ICD-10-CM

## 2020-12-21 DIAGNOSIS — I82402 Acute embolism and thrombosis of unspecified deep veins of left lower extremity: Secondary | ICD-10-CM | POA: Diagnosis not present

## 2020-12-21 DIAGNOSIS — K5901 Slow transit constipation: Secondary | ICD-10-CM | POA: Diagnosis not present

## 2020-12-21 DIAGNOSIS — I69398 Other sequelae of cerebral infarction: Secondary | ICD-10-CM | POA: Diagnosis not present

## 2020-12-21 DIAGNOSIS — I2510 Atherosclerotic heart disease of native coronary artery without angina pectoris: Secondary | ICD-10-CM | POA: Diagnosis present

## 2020-12-21 DIAGNOSIS — M25552 Pain in left hip: Secondary | ICD-10-CM | POA: Diagnosis present

## 2020-12-21 DIAGNOSIS — R7303 Prediabetes: Secondary | ICD-10-CM | POA: Diagnosis present

## 2020-12-21 DIAGNOSIS — I69354 Hemiplegia and hemiparesis following cerebral infarction affecting left non-dominant side: Principal | ICD-10-CM

## 2020-12-21 DIAGNOSIS — I69391 Dysphagia following cerebral infarction: Secondary | ICD-10-CM

## 2020-12-21 DIAGNOSIS — Z9889 Other specified postprocedural states: Secondary | ICD-10-CM

## 2020-12-21 DIAGNOSIS — Z820 Family history of epilepsy and other diseases of the nervous system: Secondary | ICD-10-CM | POA: Diagnosis not present

## 2020-12-21 DIAGNOSIS — I824Z2 Acute embolism and thrombosis of unspecified deep veins of left distal lower extremity: Secondary | ICD-10-CM

## 2020-12-21 DIAGNOSIS — D62 Acute posthemorrhagic anemia: Secondary | ICD-10-CM | POA: Diagnosis not present

## 2020-12-21 DIAGNOSIS — I10 Essential (primary) hypertension: Secondary | ICD-10-CM | POA: Diagnosis not present

## 2020-12-21 DIAGNOSIS — K219 Gastro-esophageal reflux disease without esophagitis: Secondary | ICD-10-CM | POA: Diagnosis not present

## 2020-12-21 DIAGNOSIS — R058 Other specified cough: Secondary | ICD-10-CM

## 2020-12-21 DIAGNOSIS — I639 Cerebral infarction, unspecified: Secondary | ICD-10-CM | POA: Diagnosis not present

## 2020-12-21 DIAGNOSIS — M1990 Unspecified osteoarthritis, unspecified site: Secondary | ICD-10-CM | POA: Diagnosis not present

## 2020-12-21 DIAGNOSIS — J9811 Atelectasis: Secondary | ICD-10-CM | POA: Diagnosis not present

## 2020-12-21 DIAGNOSIS — R1310 Dysphagia, unspecified: Secondary | ICD-10-CM | POA: Diagnosis not present

## 2020-12-21 DIAGNOSIS — E785 Hyperlipidemia, unspecified: Secondary | ICD-10-CM | POA: Diagnosis present

## 2020-12-21 DIAGNOSIS — Z7982 Long term (current) use of aspirin: Secondary | ICD-10-CM

## 2020-12-21 DIAGNOSIS — I6529 Occlusion and stenosis of unspecified carotid artery: Secondary | ICD-10-CM | POA: Diagnosis present

## 2020-12-21 DIAGNOSIS — M7989 Other specified soft tissue disorders: Secondary | ICD-10-CM | POA: Diagnosis not present

## 2020-12-21 DIAGNOSIS — D649 Anemia, unspecified: Secondary | ICD-10-CM

## 2020-12-21 DIAGNOSIS — R2689 Other abnormalities of gait and mobility: Secondary | ICD-10-CM | POA: Diagnosis not present

## 2020-12-21 DIAGNOSIS — D72829 Elevated white blood cell count, unspecified: Secondary | ICD-10-CM

## 2020-12-21 DIAGNOSIS — T508X5A Adverse effect of diagnostic agents, initial encounter: Secondary | ICD-10-CM | POA: Diagnosis present

## 2020-12-21 DIAGNOSIS — G479 Sleep disorder, unspecified: Secondary | ICD-10-CM | POA: Diagnosis not present

## 2020-12-21 DIAGNOSIS — F419 Anxiety disorder, unspecified: Secondary | ICD-10-CM | POA: Diagnosis not present

## 2020-12-21 DIAGNOSIS — I959 Hypotension, unspecified: Secondary | ICD-10-CM | POA: Diagnosis present

## 2020-12-21 LAB — BASIC METABOLIC PANEL
Anion gap: 7 (ref 5–15)
BUN: 22 mg/dL (ref 8–23)
CO2: 26 mmol/L (ref 22–32)
Calcium: 8.6 mg/dL — ABNORMAL LOW (ref 8.9–10.3)
Chloride: 105 mmol/L (ref 98–111)
Creatinine, Ser: 1.3 mg/dL — ABNORMAL HIGH (ref 0.61–1.24)
GFR, Estimated: 55 mL/min — ABNORMAL LOW (ref >60)
Glucose, Bld: 123 mg/dL — ABNORMAL HIGH (ref 70–99)
Potassium: 4.1 mmol/L (ref 3.5–5.1)
Sodium: 138 mmol/L (ref 135–145)

## 2020-12-21 LAB — CBC
HCT: 32.5 % — ABNORMAL LOW (ref 39.0–52.0)
Hemoglobin: 10.9 g/dL — ABNORMAL LOW (ref 13.0–17.0)
MCH: 27.6 pg (ref 26.0–34.0)
MCHC: 33.5 g/dL (ref 30.0–36.0)
MCV: 82.3 fL (ref 80.0–100.0)
Platelets: 213 10*3/uL (ref 150–400)
RBC: 3.95 MIL/uL — ABNORMAL LOW (ref 4.22–5.81)
RDW: 13.2 % (ref 11.5–15.5)
WBC: 14.2 10*3/uL — ABNORMAL HIGH (ref 4.0–10.5)
nRBC: 0 % (ref 0.0–0.2)

## 2020-12-21 MED ORDER — ASPIRIN 325 MG PO TBEC: 325.0000 mg | DELAYED_RELEASE_TABLET | Freq: Every day | ORAL | 0 refills | Status: DC

## 2020-12-21 MED ORDER — HEPARIN SODIUM (PORCINE) 5000 UNIT/ML IJ SOLN
5000.0000 [IU] | Freq: Three times a day (TID) | INTRAMUSCULAR | Status: DC
  Administered 2020-12-21: 16:00:00 5000 [IU] via SUBCUTANEOUS
  Filled 2020-12-21: qty 1

## 2020-12-21 MED ORDER — TAMSULOSIN HCL 0.4 MG PO CAPS: 0.4000 mg | ORAL_CAPSULE | Freq: Every day | ORAL | 1 refills | Status: DC

## 2020-12-21 MED ORDER — METOPROLOL TARTRATE 25 MG PO TABS
25.0000 mg | ORAL_TABLET | Freq: Two times a day (BID) | ORAL | Status: DC
  Administered 2020-12-21 – 2021-01-11 (×42): 25 mg via ORAL
  Filled 2020-12-21 (×43): qty 1

## 2020-12-21 MED ORDER — ROSUVASTATIN CALCIUM 20 MG PO TABS
40.0000 mg | ORAL_TABLET | Freq: Every day | ORAL | Status: DC
  Administered 2020-12-21 – 2021-01-10 (×21): 40 mg via ORAL
  Filled 2020-12-21 (×21): qty 2

## 2020-12-21 MED ORDER — FLEET ENEMA 7-19 GM/118ML RE ENEM: 1.0000 | ENEMA | Freq: Once | RECTAL | Status: DC | PRN

## 2020-12-21 MED ORDER — AMLODIPINE BESYLATE 10 MG PO TABS
10.0000 mg | ORAL_TABLET | Freq: Every day | ORAL | Status: DC
  Administered 2020-12-22 – 2021-01-11 (×21): 10 mg via ORAL
  Filled 2020-12-21 (×21): qty 1

## 2020-12-21 MED ORDER — DIPHENHYDRAMINE HCL 12.5 MG/5ML PO ELIX
12.5000 mg | ORAL_SOLUTION | Freq: Four times a day (QID) | ORAL | Status: DC | PRN
Start: 2020-12-21 — End: 2021-01-11

## 2020-12-21 MED ORDER — LIDOCAINE HCL URETHRAL/MUCOSAL 2 % EX GEL: CUTANEOUS | Status: DC | PRN

## 2020-12-21 MED ORDER — PROCHLORPERAZINE 25 MG RE SUPP: 12.5000 mg | Freq: Four times a day (QID) | RECTAL | Status: DC | PRN

## 2020-12-21 MED ORDER — OXYCODONE-ACETAMINOPHEN 5-325 MG PO TABS
1.0000 | ORAL_TABLET | ORAL | 0 refills | Status: DC | PRN
Start: 2020-12-21 — End: 2021-01-11

## 2020-12-21 MED ORDER — PROCHLORPERAZINE EDISYLATE 10 MG/2ML IJ SOLN
5.0000 mg | Freq: Four times a day (QID) | INTRAMUSCULAR | Status: DC | PRN
Start: 2020-12-21 — End: 2021-01-11

## 2020-12-21 MED ORDER — OXYCODONE-ACETAMINOPHEN 5-325 MG PO TABS: 1.0000 | ORAL_TABLET | ORAL | Status: DC | PRN

## 2020-12-21 MED ORDER — MELATONIN 5 MG PO TABS
5.0000 mg | ORAL_TABLET | Freq: Every day | ORAL | Status: DC
  Administered 2020-12-21 – 2021-01-10 (×21): 5 mg via ORAL
  Filled 2020-12-21 (×21): qty 1

## 2020-12-21 MED ORDER — BISACODYL 10 MG RE SUPP: 10.0000 mg | Freq: Every day | RECTAL | Status: DC | PRN

## 2020-12-21 MED ORDER — PANTOPRAZOLE SODIUM 40 MG PO TBEC
40.0000 mg | DELAYED_RELEASE_TABLET | Freq: Every day | ORAL | Status: DC
  Administered 2020-12-22 – 2021-01-11 (×21): 40 mg via ORAL
  Filled 2020-12-21 (×21): qty 1

## 2020-12-21 MED ORDER — CLOPIDOGREL BISULFATE 75 MG PO TABS: 75.0000 mg | ORAL_TABLET | Freq: Every day | ORAL | 1 refills | Status: DC

## 2020-12-21 MED ORDER — SENNOSIDES-DOCUSATE SODIUM 8.6-50 MG PO TABS
1.0000 | ORAL_TABLET | Freq: Every evening | ORAL | Status: DC | PRN
  Administered 2021-01-05: 1 via ORAL

## 2020-12-21 MED ORDER — PROCHLORPERAZINE MALEATE 5 MG PO TABS
5.0000 mg | ORAL_TABLET | Freq: Four times a day (QID) | ORAL | Status: DC | PRN
Start: 2020-12-21 — End: 2021-01-11

## 2020-12-21 MED ORDER — TAMSULOSIN HCL 0.4 MG PO CAPS
0.4000 mg | ORAL_CAPSULE | Freq: Every day | ORAL | Status: DC
  Administered 2020-12-22 – 2021-01-10 (×20): 0.4 mg via ORAL
  Filled 2020-12-21 (×20): qty 1

## 2020-12-21 MED ORDER — MAGNESIUM HYDROXIDE 400 MG/5ML PO SUSP
30.0000 mL | Freq: Every day | ORAL | Status: DC | PRN
  Administered 2020-12-21: 19:00:00 30 mL via ORAL
  Filled 2020-12-21: qty 30

## 2020-12-21 MED ORDER — MAGNESIUM HYDROXIDE 400 MG/5ML PO SUSP
30.0000 mL | Freq: Every day | ORAL | Status: DC
Start: 2020-12-22 — End: 2021-01-07
  Administered 2020-12-22 – 2021-01-05 (×6): 30 mL via ORAL
  Filled 2020-12-21 (×13): qty 30

## 2020-12-21 MED ORDER — CLOPIDOGREL BISULFATE 75 MG PO TABS
75.0000 mg | ORAL_TABLET | Freq: Every day | ORAL | Status: DC
  Administered 2020-12-22 – 2021-01-11 (×21): 75 mg via ORAL
  Filled 2020-12-21 (×21): qty 1

## 2020-12-21 MED ORDER — AMLODIPINE BESYLATE 10 MG PO TABS: 10.0000 mg | ORAL_TABLET | Freq: Every day | ORAL | 1 refills | Status: DC

## 2020-12-21 MED ORDER — ENOXAPARIN SODIUM 40 MG/0.4ML IJ SOSY
40.0000 mg | PREFILLED_SYRINGE | INTRAMUSCULAR | Status: DC
  Administered 2020-12-21 – 2020-12-30 (×10): 40 mg via SUBCUTANEOUS
  Filled 2020-12-21 (×8): qty 0.4

## 2020-12-21 MED ORDER — MAGNESIUM HYDROXIDE 400 MG/5ML PO SUSP: 30.0000 mL | Freq: Every day | ORAL | 0 refills | Status: DC

## 2020-12-21 MED ORDER — POLYETHYLENE GLYCOL 3350 17 G PO PACK: 17.0000 g | PACK | Freq: Every day | ORAL | Status: DC | PRN

## 2020-12-21 MED ORDER — TRAZODONE HCL 50 MG PO TABS
25.0000 mg | ORAL_TABLET | Freq: Every evening | ORAL | Status: DC | PRN
  Administered 2020-12-21 – 2020-12-23 (×3): 25 mg via ORAL
  Filled 2020-12-21 (×3): qty 1

## 2020-12-21 MED ORDER — MAGIC MOUTHWASH
5.0000 mL | Freq: Three times a day (TID) | ORAL | Status: DC
  Administered 2020-12-21 – 2021-01-11 (×61): 5 mL via ORAL
  Filled 2020-12-21 (×66): qty 5

## 2020-12-21 MED ORDER — ROSUVASTATIN CALCIUM 40 MG PO TABS: 40.0000 mg | ORAL_TABLET | Freq: Every day | ORAL | 1 refills | Status: DC

## 2020-12-21 MED ORDER — ALUM & MAG HYDROXIDE-SIMETH 200-200-20 MG/5ML PO SUSP
30.0000 mL | ORAL | Status: DC | PRN
  Administered 2020-12-21: 19:00:00 30 mL via ORAL
  Filled 2020-12-21: qty 30

## 2020-12-21 MED ORDER — GUAIFENESIN-DM 100-10 MG/5ML PO SYRP
5.0000 mL | ORAL_SOLUTION | Freq: Four times a day (QID) | ORAL | Status: DC | PRN
  Administered 2020-12-25 – 2020-12-31 (×15): 10 mL via ORAL
  Administered 2020-12-31: 22:00:00 5 mL via ORAL
  Administered 2021-01-01 – 2021-01-11 (×18): 10 mL via ORAL
  Filled 2020-12-21 (×36): qty 10

## 2020-12-21 MED ORDER — ALUM & MAG HYDROXIDE-SIMETH 200-200-20 MG/5ML PO SUSP
15.0000 mL | ORAL | 0 refills | Status: DC | PRN
Start: 2020-12-21 — End: 2021-01-11

## 2020-12-21 MED ORDER — METOPROLOL TARTRATE 25 MG PO TABS: 25.0000 mg | ORAL_TABLET | Freq: Two times a day (BID) | ORAL | 1 refills | Status: DC

## 2020-12-21 MED ORDER — BLOOD PRESSURE CONTROL BOOK
Freq: Once | Status: AC
Start: 2020-12-21 — End: 2020-12-21
  Filled 2020-12-21: qty 1

## 2020-12-21 MED ORDER — MELATONIN 3 MG PO TABS: 3.0000 mg | ORAL_TABLET | Freq: Every day | ORAL | 0 refills | Status: DC

## 2020-12-21 MED ORDER — GUAIFENESIN-DM 100-10 MG/5ML PO SYRP: 15.0000 mL | ORAL_SOLUTION | ORAL | 0 refills | Status: DC | PRN

## 2020-12-21 MED ORDER — ACETAMINOPHEN 325 MG PO TABS
325.0000 mg | ORAL_TABLET | ORAL | Status: DC | PRN
  Administered 2020-12-21 – 2021-01-10 (×18): 650 mg via ORAL
  Filled 2020-12-21 (×21): qty 2

## 2020-12-21 MED ORDER — ASPIRIN EC 325 MG PO TBEC
325.0000 mg | DELAYED_RELEASE_TABLET | Freq: Every day | ORAL | Status: DC
  Administered 2020-12-22 – 2021-01-11 (×21): 325 mg via ORAL
  Filled 2020-12-21 (×21): qty 1

## 2020-12-21 MED ORDER — DOCUSATE SODIUM 100 MG PO CAPS
200.0000 mg | ORAL_CAPSULE | Freq: Every day | ORAL | Status: DC
  Administered 2020-12-22 – 2021-01-10 (×20): 200 mg via ORAL
  Filled 2020-12-21 (×21): qty 2

## 2020-12-21 NOTE — Progress Notes (Signed)
Speech Language Pathology Treatment: Cognitive-Linquistic  Patient Details Name: Joel Peck MRN: [id number] DOB: Nov 05, 1935 Today's Date: 12/21/2020 Time: 1004-1020 SLP Time Calculation (min) (ACUTE ONLY): 16 min  Assessment / Plan / Recommendation Clinical Impression  Pt was seen for cognitive therapy with emphasis today on memory strategies. He recalled 4 out of 4 words during delayed recall task using repetition and associations to facilitate recall. Min cues were given for recall of additional daily events this admission. SLP provided education on additional memory strategies and pt demonstrated verbal problem solving regarding how to implement strategies with minimal cueing needed. He and his daughter present acknowledged education provided and are eager for him to go try to get to rehab as he is motivated to improve further.    HPI HPI: Joel Peck is a 85 y.o. male with PMH significant for with PMH significant for HTN, arthritis, CAD who presents with L sided weakness, left facial droop and L hemianopsia. CTH with no ICH or large territory stroke. He was given [facility] and will be admitted to the ICU. MRI shows patchy acute ischemic nonhemorrhagic infarct involving the  posterior limb of the right internal capsule, right periatrial white  matter, and mesial right temporal lobe.      SLP Plan  Continue with current plan of care      Recommendations for follow up therapy are one component of a multi-disciplinary discharge planning process, led by the attending physician.  Recommendations may be updated based on patient status, additional functional criteria and insurance authorization.    Recommendations                   Follow Up Recommendations: Acute inpatient rehab (3hours/day) Assistance recommended at discharge: None SLP Visit Diagnosis: Cognitive communication deficit (G92.010) Plan: Continue with current plan of care           Osie Bond., M.A.  [facility] Pager [phone] Office [phone]  12/21/2020, 11:08 AM

## 2020-12-21 NOTE — Progress Notes (Signed)
° °  VASCULAR SURGERY ASSESSMENT & PLAN:   POD 1 RIGHT CEA: The patient is doing well.  His neurologic exam is back to baseline.  VASCULAR QUALITY INITIATIVE: He is on aspirin, Plavix, and a statin.  DVT PROPHYLAXIS: I have started subcu heparin.  I have written to discontinue the Foley  Okay to transfer to rehab from our standpoint.   SUBJECTIVE:   No complaints.  PHYSICAL EXAM:   Vitals:   12/21/20 0200 12/21/20 0300 12/21/20 0400 12/21/20 0439  BP:    (!) 153/42  Pulse: 65 64 87 64  Resp: 17  18 17   Temp:    97.7 F (36.5 C)  TempSrc:    Oral  SpO2: 98% 97% 100% 99%  Weight:      Height:       His neurologic exam is back to baseline. His right neck incision looks fine.  LABS:   Lab Results  Component Value Date   WBC 14.2 (H) 12/21/2020   HGB 10.9 (L) 12/21/2020   HCT 32.5 (L) 12/21/2020   MCV 82.3 12/21/2020   PLT 213 12/21/2020   Lab Results  Component Value Date   CREATININE 1.30 (H) 12/21/2020   Lab Results  Component Value Date   INR 1.0 12/12/2020    PROBLEM LIST:    Principal Problem:   Stroke determined by clinical assessment [facility]) Active Problems:   Coronary artery disease involving native coronary artery of native heart without angina pectoris   CURRENT MEDS:     stroke: mapping our early stages of recovery book   Does not apply Once   amLODipine  10 mg Oral Daily   aspirin EC  325 mg Oral Daily   Chlorhexidine Gluconate Cloth  6 each Topical Daily   clopidogrel  75 mg Oral Daily   docusate sodium  100 mg Oral BID   magnesium hydroxide  30 mL Oral Daily   mouth rinse  15 mL Mouth Rinse BID   melatonin  3 mg Oral QHS   metoprolol tartrate  25 mg Oral BID   pantoprazole  40 mg Oral Daily   rosuvastatin  40 mg Oral QHS   tamsulosin  0.4 mg Oral Daily    Deitra Mayo Office: [phone] 12/21/2020

## 2020-12-21 NOTE — Anesthesia Postprocedure Evaluation (Signed)
Anesthesia Post Note  Patient: KVON MCILHENNY  Procedure(s) Performed: RIGHT CAROTID ENDARTERECTOMY (Right) PATCH ANGIOPLASTY USING XENOSURE BIOLOGIC PATCH (Right: Neck)     Patient location during evaluation: Other Anesthesia Type: General Level of consciousness: awake and alert Pain management: pain level controlled Vital Signs Assessment: post-procedure vital signs reviewed and stable Respiratory status: spontaneous breathing, nonlabored ventilation and respiratory function stable Cardiovascular status: blood pressure returned to baseline and stable Postop Assessment: no apparent nausea or vomiting Anesthetic complications: no   No notable events documented.  Last Vitals:  Vitals:   12/21/20 0439 12/21/20 0818  BP: (!) 153/42 (!) 124/41  Pulse: 64 72  Resp: 17 20  Temp: 36.5 C 36.8 C  SpO2: 99% 96%    Last Pain:  Vitals:   12/21/20 1003  TempSrc:   PainSc: 5                  Shrika Milos,W. EDMOND

## 2020-12-21 NOTE — Progress Notes (Signed)
STROKE TEAM PROGRESS NOTE   INTERVAL HISTORY Patient is lying comfortably in bed.  He had elective right carotid endarterectomy done by Dr. Doren Custard yesterday..  Postprocedure he was noted having increased left-sided weakness which has improved stat carotid ultrasound was done on the right side which showed patent right ICA flow.  Blood pressure has been adequate. Vitals:   12/21/20 0400 12/21/20 0439 12/21/20 0818 12/21/20 1213  BP:  (!) 153/42 (!) 124/41 (!) 132/45  Pulse: 87 64 72 (!) 56  Resp: 18 17 20 17   Temp:  97.7 F (36.5 C) 98.2 F (36.8 C) 98 F (36.7 C)  TempSrc:  Oral Oral Oral  SpO2: 100% 99% 96% 98%  Weight:      Height:       CBC:  Recent Labs  Lab 12/20/20 0510 12/21/20 0435  WBC 12.1* 14.2*  HGB 13.0 10.9*  HCT 39.9 32.5*  MCV 82.8 82.3  PLT 240 546   Basic Metabolic Panel:  Recent Labs  Lab 12/20/20 0510 12/21/20 0435  NA 137 138  K 4.5 4.1  CL 104 105  CO2 25 26  GLUCOSE 104* 123*  BUN 11 22  CREATININE 0.93 1.30*  CALCIUM 9.0 8.6*   Lipid Panel:  No results for input(s): CHOL, TRIG, HDL, CHOLHDL, VLDL, LDLCALC in the last 168 hours.  HgbA1c:  No results for input(s): HGBA1C in the last 168 hours.  Urine Drug Screen: No results for input(s): LABOPIA, COCAINSCRNUR, LABBENZ, AMPHETMU, THCU, LABBARB in the last 168 hours.  Alcohol Level No results for input(s): ETH in the last 168 hours.  IMAGING past 24 hours VAS US CAROTID  Result Date: 12/20/2020 Carotid Arterial Duplex Study Patient Name:  Joel Peck  Date of Exam:   12/20/2020 Medical Rec #: [id number]       Accession #:    [id number] Date of Birth: 10/28/1935       Patient Gender: M Patient Age:   85 years Exam Location:  [facility] Procedure:      VAS US CAROTID Referring Phys: Harrell Gave DICKSON --------------------------------------------------------------------------------  Indications:       Check patency post surgical procedure. Other Factors:     12/20/2020 - RIGHT CAROTID  ENDARTERECTOMY                    PATCH ANGIOPLASTY USING XENOSURE BIOLOGIC PATCH. Comparison Study:  12/15/2020 - Right Carotid: Velocities in the right ICA are                    consistent with a 60-79% stenosis. Non-hemodynamically                    significant plaque <50% noted in the CCA. The ECA appears                    >50% stenosed.                     Left Carotid: Velocities in the left ICA are consistent with                    a 1-39%                    stenosis. Non-hemodynamically significant plaque <50% noted                    in the CCA. The ECA appears >50% stenosed.  Vertebrals: Bilateral vertebral arteries demonstrate                    antegrade flow.                    Subclavians: Normal flow hemodynamics were seen in bilateral                    subclavian arteries. Performing Technologist: Oliver Hum RVT  Examination Guidelines: A complete evaluation includes B-mode imaging, spectral Doppler, color Doppler, and power Doppler as needed of all accessible portions of each vessel. Bilateral testing is considered an integral part of a complete examination. Limited examinations for reoccurring indications may be performed as noted.  Right Carotid Findings: +----------+--------+--------+--------+-----------------------+--------+             PSV cm/s EDV cm/s Stenosis Plaque Description      Comments  +----------+--------+--------+--------+-----------------------+--------+  CCA Prox   80       12                smooth and heterogenous           +----------+--------+--------+--------+-----------------------+--------+  CCA Distal 37       7                 smooth and heterogenous           +----------+--------+--------+--------+-----------------------+--------+  ICA Prox   55       11                smooth and heterogenous           +----------+--------+--------+--------+-----------------------+--------+  ICA Distal 73       17                                                   +----------+--------+--------+--------+-----------------------+--------+ +---------+--------+--+--------+--+---------+  Vertebral PSV cm/s 69 EDV cm/s 11 Antegrade  +---------+--------+--+--------+--+---------+   Summary: Right Carotid: Velocities in the right ICA are consistent with a 1-39% stenosis.                The right ICA appears patent.  *See table(s) above for measurements and observations.  Electronically signed by Servando Snare MD on 12/20/2020 at 4:36:26 PM.    Final     PHYSICAL EXAM  Temp:  [97.7 F (36.5 C)-98.2 F (36.8 C)] 98 F (36.7 C) (12/20 1213) Pulse Rate:  [56-87] 56 (12/20 1213) Resp:  [11-20] 17 (12/20 1213) BP: (102-153)/(36-54) 132/45 (12/20 1213) SpO2:  [91 %-100 %] 98 % (12/20 1213) Arterial Line BP: (119-153)/(35-57) 151/40 (12/20 0400) Weight:  [79 kg] 79 kg (12/19 1817)  General -mildly obese elderly African-American male, in no apparent distress. Ophthalmologic - fundi not visualized due to noncooperation. Cardiovascular - Regular rhythm and rate. Mental Status -  Level of arousal and orientation to time, place, and person were intact. Language including expression, naming, repetition, comprehension was assessed and found intact.  Mild dysarthria. Attention span and concentration were normal. Recent and remote memory were intact. Fund of Knowledge was assessed and was intact.  Cranial Nerves II - XII - II -left upper patchy visual field deficit III, IV, VI - Extraocular movements intact. V - Facial sensation intact bilaterally. VII -mild left lower facial asymmetry.   VIII - Hearing & vestibular intact bilaterally. X - Palate  elevates symmetrically. XI - Chin turning & shoulder shrug intact bilaterally. XII - Tongue protrusion intact.  Motor Strength -  Bulk was normal and fasciculations were absent.   Left upper extremity strength 3/5 with some effort against gravity, hand grip 1/5 Left lower extremity strength 4/5 with drift  present Bilateral right extremities 5/5 Motor Tone - Muscle tone was assessed at the neck and appendages and was normal.  Reflexes - The patients reflexes were symmetrical in all extremities and he had no pathological reflexes. Sensory - Light touch, temperature/pinprick were assessed and were symmetrical.   Coordination -no ataxia noted.  Patient has diminished fine motor control and decreased coordination in the upper extremities Gait and Station - deferred.   NIH stroke scale 4 Premorbid MRs 0 ASSESSMENT/PLAN [name] is a 85 y.o. male with a past medical history significant for hypertension arthritis CAD who presents with left-sided weakness left facial droop and left hemianopsia.  Patient was seen at [facility] 12/10 for a 1 hour episode of left sided weakness. He was discharged 12/11 around 1330. Around 1700 he had a sudden onset of left-sided weakness.  EMS was called and he was brought to the emergency department.  Family states that left leg weakness had completely resolved.  When he was discharged from [facility] and he had no symptoms and he was discharged on Plavix. He received TNKase at 1753. CEA with Dr. Scot Dock on Monday and then plan to discharge to CIR once stable post procedure.   Stroke: scattered infarcts at right MCA/PCA border zone and right ACA territory s/p TNK , likely secondary to large vessel source from right ICA high grade stenosis.  Code Stroke CT head No acute abnormality. ASPECTS 10.    Repeat head CT-development of subtle low-density in the mesial temporal lobe, hippocampus, and inferior basal ganglia on the right side. CTA head & neck-negative for LVO, 55% stenosis distal CCA on the right with 50% diameter stenosis proximal right ICA.  MRI-patchy acute ischemic nonhemorrhagic infarct involving the posterior limb of the right internal capsule right periatrial white matter and mesial right temporal lobe.  (right medial temporal lobe (MCA/PCA)  and right optic radiation and PLIC (AchA) infarcts) 2D Echo EF 60 to 65% LDL 111 HgbA1c 6.4 VTE prophylaxis - SCDs No anticoagulation prior to admission, now on aspirin 81 mg daily and clopidogrel 75 mg daily for 3 months and then aspirin alone. Therapy recommendations: Acute inpatient rehab Disposition: Pending  Carotid stenosis Pt has been following in [place] for b/l carotid stenosis CTA head & neck 55% stenosis distal CCA on the right with 50% diameter stenosis proximal right ICA. Left ICA 30% stenosis but prominent atherosclerosis with soft plaques CUS right ICA 60-79% stenosis Current stroke likely related to right ICA stenosis Appreciate Dr. Johnsie Cancel for cardiac clearance  [facility] consult with Dr. Scot Dock Tentative plan for carotid endarterectomy on Monday   Hx stroke/TIA Discharged 12/10 from [facility] after a transient episode of left sided weakness (confirmed with daughter and patient).  Patient was discharged on Plavix and aspirin.  Hypertension Home meds: Hydralazine 25 mg, Lopressor 25 mg, amlodipine 2.5 mg Stable on the higher end Long-term BP goal normotensive Increase Amlodipine 5 mg->10mg   Continue Lopressor 25 mg   Hyperlipidemia Home meds: Rosuvastatin 20 mg, resumed in hospital LDL 111, goal < 70 Rosuvastatin 40mg  Continue statin at discharge  Other Stroke Risk Factors Advanced Age >/= 11  Coronary artery disease Quit smoking 40 years ago  Other Active Problems BPH Leukocytosis WBC 12.9->11.1->10.0->10.1 Hypokalemia - K 3.4 - 3.8 Constipation - on senakot PRN, on colace bid, on milk of magnesium daily and will give duocolax supporsitory Insomnia - melatonin  Hospital day # 9   Continue on dual antiplatelet therapy and aggressive risk factor modification.  Transfer to inpatient rehab if bed available.  He also appears to be at risk for sleep apnea and may benefit with possible consideration for participation in the sleep smart study if  interested. Long discussion with patient and his daughter at the bedside and answered questions.     Antony Contras, MD Medical Director [facility] Pager: [phone] 12/21/2020 3:30 PM   To contact Stroke Continuity provider, please refer to [email or web address] After hours, contact General Neurology

## 2020-12-21 NOTE — Progress Notes (Signed)
Patient ID: Joel Peck, male   DOB: 01-02-1936, 85 y.o.   MRN: [id number]   Pt arrive to 4W13 per bed. Son at bedside. Pt a+Ox4, policies and safety precautions discussed with pt, pt in agreement. Pt oriented to rehab. Vitals obtained. Assessment complete. No complications noted. Call light in reach of pt, no further needs identified. Sheela Stack, LPN

## 2020-12-21 NOTE — H&P (Signed)
Physical Medicine and Rehabilitation Admission H&P    Chief Complaint  Patient presents with   Stroke with functional deficits.     HPI: Joel Peck is an 85 year old male with history of CAD, BPH, HTN who was admitted for overnight observation at [facility] on 12/11/20 for left sided weakness and left facial droop due to TIA and was discharged 12/12/20 on Plavix. He was admitted later that evening with left sided weakness, left facial droop and left hemianopsia. CTA head negative for LVO and showed 50% stenosis in right distal CCA and proximal R-ICA and 30% stenosis distal L- CCA. He received tNKAase. MRI brain done reveling patchy acute infarct involving posterior limb internal capsule, right periatrial white matter and mesial right temporal lobe. Follow up CT head showed development of low density in mesial temporal lobe, hippocampus and inferior basal ganglia.  Dr. 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 Scot Dock consulted for input and recommended surgical intervention.  Carotid dopplers done revealing 60-79% stenosis in R-ICA   He was cleared for surgery by Dr. Johnsie Cancel as felt to be stable. He underwent R-CEA on 12/19 by Dr. Scot Dock and in PACU noted to have minimal movement on the left. Stat carotid ultrasound showed patent R-ICA but blood pressures noted to be soft. Dr. Leonie Man evaluated patient and recommended bed rest as well as fluid bolus and to keep SBP 110-120 range. He is also appeared to be at risk for sleep apnea question of consideration of Sleep Smart study. He has had improvement in left sided weakness and is back to baseline today. Dr. Leonie Man recommended DAPT X 3 months followed by ASA alone. Patient with resultant left inattention with decreased postural reflexes, left sided weakness with decrease in coordination and delay in sequencing/problem solving. CIR recommended due to functional decline.     Pt reports heartburn in spite of Protonix- sounds like protonix was  increased- C/o throat soreness since R CEA done yesterday.  Problems using urinal, but no incontinence since Stroke.  No pain- just sore from laying in 1 place for awhile.  LBM yesterday but was also given MOM today. - wants it at bedtime daily.    Review of Systems  Constitutional:  Negative for chills and fever.  HENT:  Negative for hearing loss.   Eyes:  Negative for blurred vision and double vision.  Respiratory:  Positive for cough (occasional--sore throat since surgery). Negative for stridor.   Cardiovascular:  Negative for chest pain, palpitations and leg swelling.  Gastrointestinal:  Positive for heartburn. Negative for abdominal pain, constipation and nausea.  Genitourinary:        Occasional hesitancy. Gets up once a night around 4 am.   Musculoskeletal:  Negative for back pain and joint pain.  Neurological:  Positive for focal weakness. Negative for dizziness and headaches.  Psychiatric/Behavioral:  The patient has insomnia (in the hospital--uncomfortable).   All other systems reviewed and are negative.   Past Medical History:  Diagnosis Date   Arthritis    Coronary artery disease    Hypertension     Past Surgical History:  Procedure Laterality Date   CARDIAC CATHETERIZATION     ENDARTERECTOMY Right 12/20/2020   Procedure: RIGHT CAROTID ENDARTERECTOMY;  Surgeon: Angelia Mould, MD;  Location: [place];  Service: Vascular;  Laterality: Right;   PATCH ANGIOPLASTY Right 12/20/2020   Procedure: PATCH ANGIOPLASTY USING Rueben Bash BIOLOGIC PATCH;  Surgeon: Angelia Mould, MD;  Location: [place];  Service:  Vascular;  Laterality: Right;   TOTAL HIP ARTHROPLASTY Left 07/03/2018   Procedure: TOTAL HIP ARTHROPLASTY ANTERIOR APPROACH;  Surgeon: Rod Can, MD;  Location: [facility];  Service: Orthopedics;  Laterality: Left;    Family History  Problem Relation Age of Onset   Dementia Mother    Stroke Father    Hypertension Sister     Social History:  Retired truck  driver--golf's 3X wk and gardens. His daughter lives with him and manages home. Sister provides meals. He drives and was independent PTA without AD. He  reports that he has quit smoking. He has never used smokeless tobacco. He reports that he does has a sip of wine occasionally. He does not use drugs.   Allergies: No Known Allergies   Medications Prior to Admission  Medication Sig Dispense Refill   acetaminophen (TYLENOL) 325 MG tablet Take 650 mg by mouth every 6 (six) hours as needed for headache (pain).     amLODipine (NORVASC) 2.5 MG tablet Take 2.5 mg by mouth every morning.     aspirin EC 81 MG tablet Take 81 mg by mouth every morning. Swallow whole.     benazepril (LOTENSIN) 20 MG tablet Take 40 mg by mouth every morning.     celecoxib (CELEBREX) 200 MG capsule Take 200 mg by mouth at bedtime.      hydrALAZINE (APRESOLINE) 25 MG tablet Take 25 mg by mouth 3 (three) times daily.     magnesium hydroxide (MILK OF MAGNESIA) 400 MG/5ML suspension Take 5 mLs by mouth daily as needed for mild constipation.     metoprolol tartrate (LOPRESSOR) 25 MG tablet Take 25 mg by mouth 2 (two) times daily.     omeprazole (PRILOSEC) 20 MG capsule Take 20 mg by mouth at bedtime.     tamsulosin (FLOMAX) 0.4 MG CAPS capsule Take 0.4 mg by mouth 2 (two) times a day.      atorvastatin (LIPITOR) 40 MG tablet Take 40 mg by mouth at bedtime. (Patient not taking: Reported on 12/12/2020)     bimatoprost (LUMIGAN) 0.01 % SOLN Place 1 drop into both eyes at bedtime. (Patient not taking: Reported on 12/12/2020)     clopidogrel (PLAVIX) 75 MG tablet Take 75 mg by mouth daily. (Patient not taking: Reported on 12/12/2020)     docusate sodium (COLACE) 100 MG capsule Take 1 capsule (100 mg total) by mouth 2 (two) times daily. (Patient not taking: Reported on 12/12/2020) 60 capsule 1   rosuvastatin (CRESTOR) 20 MG tablet Take 20 mg by mouth at bedtime. (Patient not taking: Reported on 12/12/2020)      Drug Regimen Review   Drug regimen was reviewed and remains appropriate with no significant issues identified  Home: Home Living Family/patient expects to be discharged to:: Private residence Living Arrangements: Children (daughter lives with him) Available Help at Discharge: Family, Available 24 hours/day Type of Home: House Home Access: Other (comment) (threshold) Home Layout: One level Bathroom Shower/Tub: Chiropodist: Handicapped height Bathroom Accessibility: Yes Home Equipment: Tub bench, Conservation officer, nature (2 wheels), Cane - single point, BSC/3in1  Lives With: Daughter   Functional History: Prior Function Prior Level of Function : Independent/Modified Independent, Driving Mobility Comments: Pt could ambulate in community; likes to play golf ADLs Comments: Independent with ADLs, IADLs,  finances, and medication  Functional Status:  Mobility: Bed Mobility Overal bed mobility: Needs Assistance Bed Mobility: Supine to Sit Rolling: Min assist Sidelying to sit: HOB elevated, Mod assist Supine to sit: Min assist Sit  to supine: Mod assist General bed mobility comments: Min A for trunk elevation to come to sitting Transfers Overall transfer level: Needs assistance Equipment used: 1 person hand held assist Transfers: Sit to/from Stand, Bed to chair/wheelchair/BSC Sit to Stand: Mod assist Bed to/from chair/wheelchair/BSC transfer type:: Step pivot Step pivot transfers: Mod assist General transfer comment: Required assist for lift assist and steadying. ASsist with weightshifting to take steps. Mild buckling noted in LLE. Ambulation/Gait Ambulation/Gait assistance: Mod assist, +2 safety/equipment Gait Distance (Feet): 20 Feet Assistive device: Rolling walker (2 wheels) Gait Pattern/deviations: Step-to pattern, Decreased step length - left, Decreased dorsiflexion - left, Decreased stride length, Trunk flexed General Gait Details: mod assist due to left leaning with  ambualtion/standing. Cues for upright posture, head up and looking straight ahead. Max cues for sequencing with walker. Assist needed to maintain left hand on walker. Family present in room and able to assist with cardiac monitor during mobility. patient has difficulty with coordination of left LE and foot placement. Gait velocity: decreased Gait velocity interpretation: <1.31 ft/sec, indicative of household ambulator Pre-gait activities: attempted to march in place however pt unable to raise R LE despite max verbal cues and blocking of L knee    ADL: ADL Overall ADL's : Needs assistance/impaired Eating/Feeding: NPO Grooming: Minimal assistance, Sitting, Wash/dry hands Grooming Details (indicate cue type and reason): requires cues and assistance for bimanual tasks Upper Body Bathing: Moderate assistance Lower Body Bathing: Maximal assistance Upper Body Dressing : Moderate assistance Lower Body Dressing: Moderate assistance, Sit to/from stand Toilet Transfer: Moderate assistance, +2 for physical assistance Toileting- Clothing Manipulation and Hygiene: Maximal assistance Toileting - Clothing Manipulation Details (indicate cue type and reason): assistance for posterior care in standing Functional mobility during ADLs: Moderate assistance General ADL Comments: pt's catheter appeared to leak, pt required max A for posterior care in standing;  Cognition: Cognition Overall Cognitive Status: Impaired/Different from baseline Arousal/Alertness: Awake/alert Orientation Level: Oriented X4 Year: 2022 Month: December Day of Week: Correct Attention: Focused, Sustained Focused Attention: Appears intact Sustained Attention: Appears intact Memory: Impaired Memory Impairment: Storage deficit, Retrieval deficit, Decreased short term memory Decreased Short Term Memory:  (Immediate: 5/5) Awareness: Appears intact Problem Solving: Impaired Problem Solving Impairment: Verbal complex Executive  Function: Sequencing, Organizing Sequencing: Impaired Sequencing Impairment: Verbal complex (clock drawing: 0/4) Organizing: Impaired Organizing Impairment: Verbal complex (backward digit span: 2/2 with additional processing time.) Cognition Arousal/Alertness: Awake/alert Behavior During Therapy: WFL for tasks assessed/performed Overall Cognitive Status: Impaired/Different from baseline Area of Impairment: Safety/judgement, Awareness, Problem solving, Attention Current Attention Level: Sustained Memory: Decreased short-term memory Safety/Judgement: Decreased awareness of safety, Decreased awareness of deficits Awareness: Emergent Problem Solving: Difficulty sequencing, Requires verbal cues, Requires tactile cues General Comments: motivated towards therapy, consistent reminders for attending to the left side of environment. cues for sequencing during weight shifting and while standing for postural correction.   Blood pressure (!) 132/45, pulse (!) 56, temperature 98 F (36.7 C), temperature source Oral, resp. rate 17, height 5\' 7"  (1.702 m), weight 79 kg, SpO2 98 %. Physical Exam Vitals and nursing note reviewed. Exam conducted with a chaperone present.  Constitutional:      Appearance: Normal appearance.     Comments: WNWD and appears younger than stated age. Occasional wet voice but clears without difficulty.  Pt sitting up in bed; daughter at bedside; finished lunch; bright affect, NAD  HENT:     Head: Normocephalic and atraumatic.     Comments: Right temple with mass over eyebrow and small scab. L  facial droop- moderate; tongue midline    Right Ear: External ear normal.     Left Ear: External ear normal.     Nose: Nose normal. No congestion.     Mouth/Throat:     Mouth: Mucous membranes are dry.     Pharynx: Oropharynx is clear. No oropharyngeal exudate.  Eyes:     General:        Right eye: No discharge.        Left eye: No discharge.     Extraocular Movements: Extraocular  movements intact.  Neck:     Comments: Right cervical incision C/D/I with skin glue in place. Non tender to touch. Very slightly swollen, but looks great  Cardiovascular:     Rate and Rhythm: Normal rate and regular rhythm.     Heart sounds: Normal heart sounds. No murmur heard.   No gallop.  Pulmonary:     Comments: A little coarse, cleared with clearing throat- good air movement Abdominal:     General: There is distension.     Tenderness: There is no abdominal tenderness.     Comments: Protuberant vs distended; normoactive; NT; soft  Musculoskeletal:     Comments: RUE_ 5/5 RLE_ 55 LUE_ biceps 4-/5; triceps 4-/5; WE 2-/5; grip 3-/5; FA 1/5 LLE- HF 3-/5, KE 3-/5, KF 3/5; DF 3/5; PF 4-/5 Difficult to extend R 2nd/3rd fingers- like trigger finger, however had since age 26.    Skin:    Comments: 3 IV's noted- L and R AC fossa- and R hand- look OK Trace swelling in L hand  Neurological:     Mental Status: He is alert and oriented to person, place, and time.     Comments: Mild left facial weakness, speech clear but occasional wet voice noted. He tends to lean to left with right gaze preference but able to scan to the left without difficulty. Mild left inattention with left hemiplegia.  Intact to light touch in all 4 extremities L inattention  Psychiatric:        Mood and Affect: Mood normal.        Behavior: Behavior normal.    Results for orders placed or performed during the hospital encounter of 12/12/20 (from the past 48 hour(s))  Basic metabolic panel     Status: Abnormal   Collection Time: 12/20/20  5:10 AM  Result Value Ref Range   Sodium 137 135 - 145 mmol/L   Potassium 4.5 3.5 - 5.1 mmol/L   Chloride 104 98 - 111 mmol/L   CO2 25 22 - 32 mmol/L   Glucose, Bld 104 (H) 70 - 99 mg/dL    Comment: Glucose reference range applies only to samples taken after fasting for at least 8 hours.   BUN 11 8 - 23 mg/dL   Creatinine, Ser 0.93 0.61 - 1.24 mg/dL   Calcium 9.0 8.9 - 10.3  mg/dL   GFR, Estimated >60 >60 mL/min    Comment: (NOTE) Calculated using the CKD-EPI Creatinine Equation (2021)    Anion gap 8 5 - 15    Comment: Performed at [facility] [address] [place] [address]  CBC     Status: Abnormal   Collection Time: 12/20/20  5:10 AM  Result Value Ref Range   WBC 12.1 (H) 4.0 - 10.5 K/uL   RBC 4.82 4.22 - 5.81 MIL/uL   Hemoglobin 13.0 13.0 - 17.0 g/dL   HCT 39.9 39.0 - 52.0 %   MCV 82.8 80.0 -  100.0 fL   MCH 27.0 26.0 - 34.0 pg   MCHC 32.6 30.0 - 36.0 g/dL   RDW 13.3 11.5 - 15.5 %   Platelets 240 150 - 400 K/uL   nRBC 0.0 0.0 - 0.2 %    Comment: Performed at [facility], [place] [address] [place], [place] [address]  Type and screen [place]     Status: None   Collection Time: 12/20/20 11:30 AM  Result Value Ref Range   ABO/RH(D) A POS    Antibody Screen NEG    Sample Expiration      12/23/2020,2359 Performed at [facility], [place] [address], [place], [place] [address]   POCT Activated clotting time     Status: None   Collection Time: 12/20/20  1:02 PM  Result Value Ref Range   Activated Clotting Time 269 seconds    Comment: Reference range 74-137 seconds for patients not on anticoagulant therapy.  CBC     Status: Abnormal   Collection Time: 12/21/20  4:35 AM  Result Value Ref Range   WBC 14.2 (H) 4.0 - 10.5 K/uL   RBC 3.95 (L) 4.22 - 5.81 MIL/uL   Hemoglobin 10.9 (L) 13.0 - 17.0 g/dL   HCT 32.5 (L) 39.0 - 52.0 %   MCV 82.3 80.0 - 100.0 fL   MCH 27.6 26.0 - 34.0 pg   MCHC 33.5 30.0 - 36.0 g/dL   RDW 13.2 11.5 - 15.5 %   Platelets 213 150 - 400 K/uL   nRBC 0.0 0.0 - 0.2 %    Comment: Performed at [facility], [place] [address] [place], [place] [address]  Basic metabolic panel     Status: Abnormal   Collection Time: 12/21/20  4:35 AM  Result Value Ref Range   Sodium 138 135 - 145 mmol/L   Potassium 4.1 3.5 - 5.1 mmol/L   Chloride 105 98 - 111 mmol/L   CO2 26 22 - 32 mmol/L    Glucose, Bld 123 (H) 70 - 99 mg/dL    Comment: Glucose reference range applies only to samples taken after fasting for at least 8 hours.   BUN 22 8 - 23 mg/dL   Creatinine, Ser 1.30 (H) 0.61 - 1.24 mg/dL   Calcium 8.6 (L) 8.9 - 10.3 mg/dL   GFR, Estimated 55 (L) >60 mL/min    Comment: (NOTE) Calculated using the CKD-EPI Creatinine Equation (2021)    Anion gap 7 5 - 15    Comment: Performed at [facility] [address] [place], [place] [address]   VAS US CAROTID  Result Date: 12/20/2020 Carotid Arterial Duplex Study Patient Name:  JADARRIUS MASELLI  Date of Exam:   12/20/2020 Medical Rec #: [id number]       Accession #:    [id number] Date of Birth: 02-Sep-1935       Patient Gender: M Patient Age:   73 years Exam Location:  [facility] Procedure:      VAS US CAROTID Referring Phys: Harrell Gave DICKSON --------------------------------------------------------------------------------  Indications:       Check patency post surgical procedure. Other Factors:     12/20/2020 - RIGHT CAROTID ENDARTERECTOMY                    PATCH ANGIOPLASTY USING XENOSURE BIOLOGIC PATCH. Comparison Study:  12/15/2020 - Right Carotid: Velocities in the right ICA are  consistent with a 60-79% stenosis. Non-hemodynamically                    significant plaque <50% noted in the CCA. The ECA appears                    >50% stenosed.                     Left Carotid: Velocities in the left ICA are consistent with                    a 1-39%                    stenosis. Non-hemodynamically significant plaque <50% noted                    in the CCA. The ECA appears >50% stenosed.                     Vertebrals: Bilateral vertebral arteries demonstrate                    antegrade flow.                    Subclavians: Normal flow hemodynamics were seen in bilateral                    subclavian arteries. Performing Technologist: Oliver Hum RVT  Examination Guidelines: A complete evaluation includes  B-mode imaging, spectral Doppler, color Doppler, and power Doppler as needed of all accessible portions of each vessel. Bilateral testing is considered an integral part of a complete examination. Limited examinations for reoccurring indications may be performed as noted.  Right Carotid Findings: +----------+--------+--------+--------+-----------------------+--------+             PSV cm/s EDV cm/s Stenosis Plaque Description      Comments  +----------+--------+--------+--------+-----------------------+--------+  CCA Prox   80       12                smooth and heterogenous           +----------+--------+--------+--------+-----------------------+--------+  CCA Distal 37       7                 smooth and heterogenous           +----------+--------+--------+--------+-----------------------+--------+  ICA Prox   55       11                smooth and heterogenous           +----------+--------+--------+--------+-----------------------+--------+  ICA Distal 73       17                                                  +----------+--------+--------+--------+-----------------------+--------+ +---------+--------+--+--------+--+---------+  Vertebral PSV cm/s 69 EDV cm/s 11 Antegrade  +---------+--------+--+--------+--+---------+   Summary: Right Carotid: Velocities in the right ICA are consistent with a 1-39% stenosis.                The right ICA appears patent.  *See table(s) above for measurements and observations.  Electronically signed by Servando Snare MD on 12/20/2020 at 4:36:26 PM.    Final  Medical Problem List and Plan: 1. 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  Antithrombotics: -DVT/anticoagulation:  Pharmaceutical: Lovenox  -antiplatelet therapy: DAPT X 3 months followed by ASA alone.  3. Pain Management: Tylenol or oxycodone depending on  severity of pain.  4. Mood: LCSW to follow for evaluation and support.   -antipsychotic agents: N/A 5. Neuropsych: This patient is capable of making decisions on his own behalf. 6. Skin/Wound Care: Routine pressure relief measures.   --monitor right neck incision for healing/edema.  7. Fluids/Electrolytes/Nutrition: Monitor I/O. Check lytes in am.  8. HTN: Monitor BP TID with SBP goals 110-120 to avoid hypoperfusion.   --On Norvasc and metoprolol BID.  --Will order orthostatic vitals to monitor for changes 9. AKI: SCr has risen from 1.03-->1.30. Likely due to dye/hypotension  --Encourage fluid intake. Will recheck SCr in am.  10. Leucocytosis: Likely reactive due to surgery. Will monitor for signs of infection 11. ABLA: Likely due to thrombolytics/surgery with drop in  Hgb 14.3--->10.9 --Will monitor with serial CBC for trend as on DAPT/Lovenox  12. Hyperlipidemia: LDL 111. Continue Cresor.  13. BPH: Continue Flomax. Will check PVRs as reporting hesitancy.  14. Post CEA odynophagia/dysphagia: He is ordering appropriate foods. Will monitor for now.  --he does not want diet adjusted at this time 15. Insomnia: Will increase melatonin to 5 mg. Family to spend night for support.  16. Chronic constipation- wants Milk of Mg every night at bedtime.  17. GERD- will increase Protonix- used Omeprazole at home.   I have personally performed a face to face diagnostic evaluation of this patient and formulated the key components of the plan.  Additionally, I have personally reviewed laboratory data, imaging studies, as well as relevant notes and concur with the physician assistant's documentation above.   The patient's status has not changed from the original H&P.  Any changes in documentation from the acute care chart have been noted above.     Bary Leriche, PA-C 12/21/2020

## 2020-12-21 NOTE — Progress Notes (Signed)
Courtney Heys, MD  Physician CASE MANAGEMENT PMR Pre-admission     Signed Date of Service:  12/14/2020  2:22 PM  Related encounter: ED to Hosp-Admission (Current) from 12/12/2020 in [facility] 4E CV [place]   Signed                                                                                                                                                                                                                                                                                                                                                                                                                                                                                                                                                            PMR Admission Coordinator Pre-Admission Assessment   Patient: Joel Peck is an 85 y.o., male MRN: [id number]  DOB: November 24, 1935 Height: 5' 7" (170.2 cm) Weight: 79 kg   Insurance Information HMO:     PPO:      PCP:      IPA:      80/20:      OTHER:  PRIMARY: [employer]      Policy#:  [id number]      Subscriber: Pt CM Name: Tammy      Phone#: [phone]     Fax#: [phone] Pre-Cert#: [id number]   Employer: Retired Benefits:  Phone #e: [phone]    Name: Johnney Ou Date: 01/02/2014 - still active Deductible: no deductible OOP Max: $3,450 ($105 met) CIR: $325/day co-pay for days 1-6, $0/day days 7-90 SNF:  $0/day co-pay for days 1-20, $184/day co-pay for days 21-100; limited to 100 days/benefit period Outpatient: $15/visit co-pay; limited by medical necessity Home Health:  100% coverage DME: 80% coverage; 20% co-insurance SECONDARY:       Policy#:      Phone#:    Development worker, community:       Phone#:    The Education officer, museum for patients in Inpatient Rehabilitation Facilities with attached Privacy Act [place] Records was provided and verbally reviewed with: Patient   Emergency Contact Information Contact Information       Name Relation Home Work Mobile    [place] Daughter [phone]        Holley Raring     [phone]    [place], Schue     [phone]           Current Medical History  Patient Admitting Diagnosis: R CVA, R CEA   History of Present Illness: Pt is 85 yo male with PMH of hx of HTN, arthritis, THA, and CAD who presented on 12/12/20 with L facial droop, L weakness, and L hemianopsia. Pt found to have Patchy acute ischemic nonhemorrhagic infarct involving the posterior limb of the right internal capsule, right periatrial white  matter, and mesial right temporal lobe.  Pt was given tNKAase and admitted to ICU.  Pt with hx of HTN, arthritis, THA, and CAD.CTA head & neck-negative for LVO, 55% stenosis distal CCA on the right with 50% diameter stenosis proximal right ICA.  MRI-patchy acute ischemic nonhemorrhagic infarct involving the posterior limb of the right internal capsule right periatrial white matter and mesial right temporal lobe. Vascular surgery consulted due to carotid stenosis, recommend bilateral carotid enderarterectomies to be completed after rehab (right on the day of d/c from CIR, and L as outpatients). Therapies consulted and recommend CIR to assist return to PLOF. Underwent R CEA on 12/20/20 by Dr. Scot Dock.   Complete NIHSS TOTAL: 10   Patient's medical record from [facility] has been reviewed by the rehabilitation admission coordinator and physician.   Past Medical History      Past Medical History:  Diagnosis Date   Arthritis     Coronary artery disease     Hypertension        Has the patient had major surgery during 100 days prior to admission? Yes   Family History   family history is not on file.   Current  Medications   Current Facility-Administered Medications:     stroke: mapping our early stages of recovery book, , Does not apply, Once, Dagoberto Ligas, PA-C   0.9 %  sodium chloride infusion, 500 mL, Intravenous, Once PRN, Dagoberto Ligas, PA-C   acetaminophen (TYLENOL) tablet 650 mg, 650 mg, Oral, Q4H PRN, 650 mg at 12/21/20 1004 **OR** acetaminophen (  TYLENOL) 160 MG/5ML solution 650 mg, 650 mg, Per Tube, Q4H PRN **OR** acetaminophen (TYLENOL) suppository 650 mg, 650 mg, Rectal, Q4H PRN, Eveland, Matthew, PA-C   alum & mag hydroxide-simeth (MAALOX/MYLANTA) 200-200-20 MG/5ML suspension 15-30 mL, 15-30 mL, Oral, Q2H PRN, Eveland, Matthew, PA-C   amLODipine (NORVASC) tablet 10 mg, 10 mg, Oral, Daily, Dagoberto Ligas, PA-C, 10 mg at 12/21/20 1005   aspirin EC tablet 325 mg, 325 mg, Oral, Daily, Dagoberto Ligas, PA-C, 325 mg at 12/21/20 1005   Chlorhexidine Gluconate Cloth 2 % PADS 6 each, 6 each, Topical, Daily, Dagoberto Ligas, PA-C, 6 each at 12/21/20 1010   clopidogrel (PLAVIX) tablet 75 mg, 75 mg, Oral, Daily, Dagoberto Ligas, PA-C, 75 mg at 12/21/20 1005   docusate sodium (COLACE) capsule 100 mg, 100 mg, Oral, BID, Eveland, Matthew, PA-C, 100 mg at 12/21/20 1009   guaiFENesin-dextromethorphan (ROBITUSSIN DM) 100-10 MG/5ML syrup 15 mL, 15 mL, Oral, Q4H PRN, Dagoberto Ligas, PA-C   heparin injection 5,000 Units, 5,000 Units, Subcutaneous, Q8H, Angelia Mould, MD   hydrALAZINE (APRESOLINE) injection 5 mg, 5 mg, Intravenous, Q20 Min PRN, Eveland, Matthew, PA-C   labetalol (NORMODYNE) injection 5-10 mg, 5-10 mg, Intravenous, Q2H PRN, Dagoberto Ligas, PA-C, 5 mg at 12/14/20 2112   magnesium hydroxide (MILK OF MAGNESIA) suspension 30 mL, 30 mL, Oral, Daily, Eveland, Matthew, PA-C, 30 mL at 12/21/20 1010   magnesium sulfate IVPB 2 g 50 mL, 2 g, Intravenous, Daily PRN, Dagoberto Ligas, PA-C   MEDLINE mouth rinse, 15 mL, Mouth Rinse, BID, Eveland, Matthew, PA-C, 15 mL at 12/21/20 1010    melatonin tablet 3 mg, 3 mg, Oral, QHS, Eveland, Matthew, PA-C, 3 mg at 12/20/20 2033   metoprolol tartrate (LOPRESSOR) injection 2-5 mg, 2-5 mg, Intravenous, Q2H PRN, Dagoberto Ligas, PA-C   metoprolol tartrate (LOPRESSOR) tablet 25 mg, 25 mg, Oral, BID, Dagoberto Ligas, PA-C, 25 mg at 12/21/20 1005   morphine 2 MG/ML injection 2 mg, 2 mg, Intravenous, Q2H PRN, Cameron Proud, Matthew, PA-C   ondansetron (ZOFRAN) injection 4 mg, 4 mg, Intravenous, Q6H PRN, Dagoberto Ligas, PA-C   oxyCODONE-acetaminophen (PERCOCET/ROXICET) 5-325 MG per tablet 1-2 tablet, 1-2 tablet, Oral, Q4H PRN, Dagoberto Ligas, PA-C, 2 tablet at 12/21/20 0425   pantoprazole (PROTONIX) EC tablet 40 mg, 40 mg, Oral, Daily, Dagoberto Ligas, PA-C, 40 mg at 12/21/20 1004   phenol (CHLORASEPTIC) mouth spray 1 spray, 1 spray, Mouth/Throat, PRN, Eveland, Matthew, PA-C   potassium chloride SA (KLOR-CON M) CR tablet 20-40 mEq, 20-40 mEq, Oral, Daily PRN, Dagoberto Ligas, PA-C   rosuvastatin (CRESTOR) tablet 40 mg, 40 mg, Oral, QHS, Eveland, Rodman [place], PA-C, 40 mg at 12/20/20 2032   senna-docusate (Senokot-S) tablet 1 tablet, 1 tablet, Oral, QHS PRN, Dagoberto Ligas, PA-C, 1 tablet at 12/16/20 2038   tamsulosin (FLOMAX) capsule 0.4 mg, 0.4 mg, Oral, Daily, Eveland, Matthew, PA-C, 0.4 mg at 12/21/20 1005   Patients Current Diet:  Diet Order                  Diet Heart Room service appropriate? Yes; Fluid consistency: Thin  Diet effective now                         Precautions / Restrictions Precautions Precautions: Fall Precaution Comments: L in attention Restrictions Weight Bearing Restrictions: No    Has the patient had 2 or more falls or a fall with injury in the past year? No   Prior Activity Level Community (5-7x/wk): pt. active in  the community PTA   Prior Functional Level Self Care: Did the patient need help bathing, dressing, using the toilet or eating? Independent   Indoor Mobility: Did the patient need  assistance with walking from room to room (with or without device)? Independent   Stairs: Did the patient need assistance with internal or external stairs (with or without device)? Independent   Functional Cognition: Did the patient need help planning regular tasks such as shopping or remembering to take medications? Independent   Patient Information Are you of Hispanic, Latino/a,or Spanish origin?: A. No, not of Hispanic, Latino/a, or Spanish origin What is your race?: B. Black or African American Do you need or want an interpreter to communicate with a doctor or health care staff?: 0. No   Patient's Response To:  Health Literacy and Transportation Is the patient able to respond to health literacy and transportation needs?: Yes Health Literacy - How often do you need to have someone help you when you read instructions, pamphlets, or other written material from your doctor or pharmacy?: Never In the past 12 months, has lack of transportation kept you from medical appointments or from getting medications?: No In the past 12 months, has lack of transportation kept you from meetings, work, or from getting things needed for daily living?: No   Home Assistive Devices / Equipment Home Equipment: Tub bench, Conservation officer, nature (2 wheels), [employer] - single point, BSC/3in1   Prior Device Use: Indicate devices/aids used by the patient prior to current illness, exacerbation or injury? None of the above   Current Functional Level Cognition   Arousal/Alertness: Awake/alert Overall Cognitive Status: Impaired/Different from baseline Current Attention Level: Sustained Orientation Level: Oriented X4 Safety/Judgement: Decreased awareness of safety, Decreased awareness of deficits General Comments: motivated towards therapy, consistent reminders for attending to the left side of environment. cues for sequencing during weight shifting and while standing for postural correction. Attention: Focused,  Sustained Focused Attention: Appears intact Sustained Attention: Appears intact Memory: Impaired Memory Impairment: Storage deficit, Retrieval deficit, Decreased short term memory Decreased Short Term Memory:  (Immediate: 5/5) Awareness: Appears intact Problem Solving: Impaired Problem Solving Impairment: Verbal complex Executive Function: Sequencing, Organizing Sequencing: Impaired Sequencing Impairment: Verbal complex (clock drawing: 0/4) Organizing: Impaired Organizing Impairment: Verbal complex (backward digit span: 2/2 with additional processing time.)    Extremity Assessment (includes Sensation/Coordination)   Upper Extremity Assessment: LUE deficits/detail LUE Deficits / Details: AAROM shoulder flexion/extension, horizontal adduction/abduction and crossing midline;pt able to touch his nose with L hand with increased time and effort. minimal AROM shoulder ROM LUE Coordination: decreased fine motor, decreased gross motor  Lower Extremity Assessment: Defer to PT evaluation RLE Deficits / Details: ROM WFL; MMT 5/5 RLE Sensation: WNL LLE Deficits / Details: ROM WFL; MMT: grossly 3/5 throughout but with slow movements to achieve full ROM LLE Sensation: WNL LLE Coordination: decreased gross motor     ADLs   Overall ADL's : Needs assistance/impaired Eating/Feeding: NPO Grooming: Minimal assistance, Sitting, Wash/dry hands Grooming Details (indicate cue type and reason): requires cues and assistance for bimanual tasks Upper Body Bathing: Moderate assistance Lower Body Bathing: Maximal assistance Upper Body Dressing : Moderate assistance Lower Body Dressing: Moderate assistance, Sit to/from stand Toilet Transfer: Moderate assistance, +2 for physical assistance Toileting- Clothing Manipulation and Hygiene: Maximal assistance Toileting - Clothing Manipulation Details (indicate cue type and reason): assistance for posterior care in standing Functional mobility during ADLs: Moderate  assistance General ADL Comments: pt's catheter appeared to leak, pt required max A for posterior  care in standing;     Mobility   Overal bed mobility: Needs Assistance Bed Mobility: Supine to Sit Rolling: Min assist Sidelying to sit: HOB elevated, Mod assist Supine to sit: Min assist Sit to supine: Mod assist General bed mobility comments: Min A for trunk elevation to come to sitting     Transfers   Overall transfer level: Needs assistance Equipment used: 1 person hand held assist Transfers: Sit to/from Stand, Bed to chair/wheelchair/BSC Sit to Stand: Mod assist Bed to/from chair/wheelchair/BSC transfer type:: Step pivot Step pivot transfers: Mod assist General transfer comment: Required assist for lift assist and steadying. ASsist with weightshifting to take steps. Mild buckling noted in LLE.     Ambulation / Gait / Stairs / Wheelchair Mobility   Ambulation/Gait Ambulation/Gait assistance: Mod assist, +2 safety/equipment Gait Distance (Feet): 20 Feet Assistive device: Rolling walker (2 wheels) Gait Pattern/deviations: Step-to pattern, Decreased step length - left, Decreased dorsiflexion - left, Decreased stride length, Trunk flexed General Gait Details: mod assist due to left leaning with ambualtion/standing. Cues for upright posture, head up and looking straight ahead. Max cues for sequencing with walker. Assist needed to maintain left hand on walker. Family present in room and able to assist with cardiac monitor during mobility. patient has difficulty with coordination of left LE and foot placement. Gait velocity: decreased Gait velocity interpretation: <1.31 ft/sec, indicative of household ambulator Pre-gait activities: attempted to march in place however pt unable to raise R LE despite max verbal cues and blocking of L knee     Posture / Balance Dynamic Sitting Balance Sitting balance - Comments: left lateral lean in sitting, at end of session pt initiating postural  corrections. Balance Overall balance assessment: Needs assistance Sitting-balance support: Feet supported Sitting balance-Leahy Scale: Fair Sitting balance - Comments: left lateral lean in sitting, at end of session pt initiating postural corrections. Postural control: Left lateral lean Standing balance support: Single extremity supported Standing balance-Leahy Scale: Poor Standing balance comment: Reliant on at least 1 UE support High Level Balance Comments: progressed briefly to miguard A for balance when grandson on R side and pt weight shifting with cues and assist for forward gaze and vertical posture     Special needs/care consideration Skin R CEA post op incision with dressing.    Previous Home Environment (from acute therapy documentation) Living Arrangements: Children (daughter lives with him)  Lives With: Daughter Available Help at Discharge: Family, Available 24 hours/day Type of Home: House Home Layout: One level Home Access: Other (comment) (threshold) Bathroom Shower/Tub: Chiropodist: Handicapped height Bathroom Accessibility: Yes How Accessible: Accessible via wheelchair   Discharge Living Setting Plans for Discharge Living Setting: Patient's home Type of Home at Discharge: House Discharge Home Layout: One level Discharge Home Access: Level entry Discharge Bathroom Shower/Tub: Tub/shower unit Discharge Bathroom Toilet: Handicapped height Discharge Bathroom Accessibility: Yes How Accessible: Accessible via walker, Accessible via wheelchair   [place] Patient Roles: Other (Comment) Contact Information: Malikhi Ogan Anticipated Caregiver: [phone] Ability/Limitations of Caregiver: Can provide Min A Caregiver Availability: 24/7 Discharge Plan Discussed with Primary Caregiver: Yes Is Caregiver In Agreement with Plan?: Yes Does Caregiver/Family have Issues with Lodging/Transportation while Pt is in Rehab?: No    Goals Patient/Family Goal for Rehab: PT/OT Supervision to Min A Expected length of stay: 12-14 days  Pt/Family Agrees to Admission and willing to participate: Yes Program Orientation Provided & Reviewed with Pt/Caregiver Including Roles  & Responsibilities: Yes   Decrease burden of Care through IP rehab  admission: Specialzed equipment needs, Decrease number of caregivers, Bowel and bladder program, and Patient/family education   Possible need for SNF placement upon discharge: not anticipated   Patient Condition: I have reviewed medical records [facility] , spoken with CM, and patient. I met with patient at the bedside for inpatient rehabilitation assessment.  Patient will benefit from ongoing PT and OT, can actively participate in 3 hours of therapy a day 5 days of the week, and can make measurable gains during the admission.  Patient will also benefit from the coordinated team approach during an Inpatient Acute Rehabilitation admission.  The patient will receive intensive therapy as well as Rehabilitation physician, nursing, social worker, and care management interventions.  Due to safety, skin/wound care, disease management, medication administration, pain management, and patient education the patient requires 24 hour a day rehabilitation nursing.  The patient is currently min to mod assist with mobility and basic ADLs.  Discharge setting and therapy post discharge at home with home health is anticipated.  Patient has agreed to participate in the Acute Inpatient Rehabilitation Program and will admit today.   Preadmission Screen Completed By:  Retta Diones, 12/21/2020 11:34 AM ______________________________________________________________________   Discussed status with Dr. Dagoberto Ligas on 12/21/20 at 10:00 am and received approval for admission today.   Admission Coordinator:  Retta Diones, RN, time 11:33 am /Date 12/21/20    Assessment/Plan: Diagnosis: Does the need  for close, 24 hr/day Medical supervision in concert with the patient's rehab needs make it unreasonable for this patient to be served in a less intensive setting? Yes Co-Morbidities requiring supervision/potential complications: R CEA, L hemiparesis; L hemianopsia; ; CAD; HTN; A1c of 6.4 Due to bladder management, bowel management, safety, skin/wound care, disease management, medication administration, pain management, and patient education, does the patient require 24 hr/day rehab nursing? Yes Does the patient require coordinated care of a physician, rehab nurse, PT, OT, and SLP to address physical and functional deficits in the context of the above medical diagnosis(es)? Yes Addressing deficits in the following areas: balance, endurance, locomotion, strength, transferring, bowel/bladder control, bathing, dressing, feeding, grooming, toileting, cognition, and speech Can the patient actively participate in an intensive therapy program of at least 3 hrs of therapy 5 days a week? Yes The potential for patient to make measurable gains while on inpatient rehab is good Anticipated functional outcomes upon discharge from inpatient rehab: supervision and min assist PT, supervision and min assist OT, supervision and min assist SLP Estimated rehab length of stay to reach the above functional goals is: 12-14 days Anticipated discharge destination: Home 10. Overall Rehab/Functional Prognosis: good     MD Signature:           Revision History                                                                  Note Details  Jan Fireman, MD File Time 12/21/2020 12:25 PM  Author Type Physician Status Signed  Last Editor Courtney Heys, MD Service CASE MANAGEMENT

## 2020-12-21 NOTE — Discharge Instructions (Signed)
   [facility]  Discharge Instructions   Carotid Surgery  Please refer to the following instructions for your post-procedure care. Your surgeon or physician assistant will discuss any changes with you.  Activity  You are encouraged to walk as much as you can. You can slowly return to normal activities but must avoid strenuous activity and heavy lifting until your doctor tell you it's okay. Avoid activities such as vacuuming or swinging a golf club. You can drive after one week if you are comfortable and you are no longer taking prescription pain medications. It is normal to feel tired for serval weeks after your surgery. It is also normal to have difficulty with sleep habits, eating, and bowel movements after surgery. These will go away with time.  Bathing/Showering  Shower daily after you go home. Do not soak in a bathtub, hot tub, or swim until the incision heals completely.  Incision Care  Shower every day. Clean your incision with mild soap and water. Pat the area dry with a clean towel. You do not need a bandage unless otherwise instructed. Do not apply any ointments or creams to your incision. You may have skin glue on your incision. Do not peel it off. It will come off on its own in about one week. Your incision may feel thickened and raised for several weeks after your surgery. This is normal and the skin will soften over time.   For Men Only: It's okay to shave around the incision but do not shave the incision itself for 2 weeks. It is common to have numbness under your chin that could last for several months.  Diet  Resume your normal diet. There are no special food restrictions following this procedure. A low fat/low cholesterol diet is recommended for all patients with vascular disease. In order to heal from your surgery, it is CRITICAL to get adequate nutrition. Your body requires vitamins, minerals, and protein. Vegetables are the best source of  vitamins and minerals. Vegetables also provide the perfect balance of protein. Processed food has little nutritional value, so try to avoid this.  Medications  Resume taking all of your medications unless your doctor or physician assistant tells you not to. If your incision is causing pain, you may take over-the- counter pain relievers such as acetaminophen (Tylenol). If you were prescribed a stronger pain medication, please be aware these medications can cause nausea and constipation. Prevent nausea by taking the medication with a snack or meal. Avoid constipation by drinking plenty of fluids and eating foods with a high amount of fiber, such as fruits, vegetables, and grains.   Do not take Tylenol if you are taking prescription pain medications.  Follow Up  Our office will schedule a follow up appointment 2-3 weeks following discharge.  Please call us immediately for any of the following conditions  . Increased pain, redness, drainage (pus) from your incision site. . Fever of 101 degrees or higher. . If you should develop stroke (slurred speech, difficulty swallowing, weakness on one side of your body, loss of vision) you should call 911 and go to the nearest emergency room. .  Reduce your risk of vascular disease:  . Stop smoking. If you would like help call [facility] at 1-800-QUIT-NOW (1-800-784-8669) or [facility] at 336-586-4000. . Manage your cholesterol . Maintain a desired weight . Control your diabetes . Keep your blood pressure down .  If you have any questions, please call the office at [phone]. 

## 2020-12-21 NOTE — Progress Notes (Signed)
IP rehab admissions - I met with patient and his daughter at the bedside.  They are in agreement to CIR.  I have medical clearance from Dr. Leonie Man for admit to CIR.  Will admit to CIR today.  Call for questions.  [phone]

## 2020-12-21 NOTE — H&P (Signed)
Physical Medicine and Rehabilitation Admission H&P        Chief Complaint  Patient presents with   Stroke with functional deficits.       HPI: Joel Peck is an 85 year old male with history of CAD, BPH, HTN who was admitted for overnight observation at [facility] on 12/11/20 for left sided weakness and left facial droop due to TIA and was discharged 12/12/20 on Plavix. He was admitted later that evening with left sided weakness, left facial droop and left hemianopsia. CTA head negative for LVO and showed 50% stenosis in right distal CCA and proximal R-ICA and 30% stenosis distal L- CCA. He received tNKAase. MRI brain done reveling patchy acute infarct involving posterior limb internal capsule, right periatrial white matter and mesial right temporal lobe. Follow up CT head showed development of low density in mesial temporal lobe, hippocampus and inferior basal ganglia.  Dr. 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 Scot Dock consulted for input and recommended surgical intervention.  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 Johnsie Cancel as felt to be stable. He underwent R-CEA on 12/19 by Dr. Scot Dock and in PACU noted to have minimal movement on the left. Stat carotid ultrasound showed patent R-ICA but blood pressures noted to be soft. Dr. Leonie Man evaluated patient and recommended bed rest as well as fluid bolus and to keep SBP 110-120 range. He is also appeared to be at risk for sleep apnea question of consideration of Sleep Smart study. He has had improvement in left sided weakness and is back to baseline today. Dr. Leonie Man recommended DAPT X 3 months followed by ASA alone. Patient with resultant left inattention with decreased postural reflexes, left sided weakness with decrease in coordination and delay in sequencing/problem solving. CIR recommended due to functional decline.       Pt reports heartburn in spite of Protonix- sounds like  protonix was increased- C/o throat soreness since R CEA done yesterday.  Problems using urinal, but no incontinence since Stroke.  No pain- just sore from laying in 1 place for awhile.  LBM yesterday but was also given MOM today. - wants it at bedtime daily.      Review of Systems  Constitutional:  Negative for chills and fever.  HENT:  Negative for hearing loss.   Eyes:  Negative for blurred vision and double vision.  Respiratory:  Positive for cough (occasional--sore throat since surgery). Negative for stridor.   Cardiovascular:  Negative for chest pain, palpitations and leg swelling.  Gastrointestinal:  Positive for heartburn. Negative for abdominal pain, constipation and nausea.  Genitourinary:        Occasional hesitancy. Gets up once a night around 4 am.   Musculoskeletal:  Negative for back pain and joint pain.  Neurological:  Positive for focal weakness. Negative for dizziness and headaches.  Psychiatric/Behavioral:  The patient has insomnia (in the hospital--uncomfortable).   All other systems reviewed and are negative.         Past Medical History:  Diagnosis Date   Arthritis     Coronary artery disease     Hypertension             Past Surgical History:  Procedure Laterality Date   CARDIAC CATHETERIZATION       ENDARTERECTOMY Right 12/20/2020    Procedure: RIGHT CAROTID ENDARTERECTOMY;  Surgeon: Angelia Mould, MD;  Location: [place];  Service: Vascular;  Laterality: Right;   PATCH ANGIOPLASTY Right 12/20/2020    Procedure: PATCH ANGIOPLASTY USING Rueben Bash BIOLOGIC PATCH;  Surgeon: Angelia Mould, MD;  Location: [facility] OR;  Service: Vascular;  Laterality: Right;   TOTAL HIP ARTHROPLASTY Left 07/03/2018    Procedure: TOTAL HIP ARTHROPLASTY ANTERIOR APPROACH;  Surgeon: Rod Can, MD;  Location: [facility] ORS;  Service: Orthopedics;  Laterality: Left;           Family History  Problem Relation Age of Onset   Dementia Mother     Stroke Father      Hypertension Sister        Social History:  Retired truck driver--golf's 3X wk and gardens. His daughter lives with him and manages home. Sister provides meals. He drives and was independent PTA without AD. He  reports that he has quit smoking. He has never used smokeless tobacco. He reports that he does has a sip of wine occasionally. He does not use drugs.     Allergies: No Known Allergies           Medications Prior to Admission  Medication Sig Dispense Refill   acetaminophen (TYLENOL) 325 MG tablet Take 650 mg by mouth every 6 (six) hours as needed for headache (pain).       amLODipine (NORVASC) 2.5 MG tablet Take 2.5 mg by mouth every morning.       aspirin EC 81 MG tablet Take 81 mg by mouth every morning. Swallow whole.       benazepril (LOTENSIN) 20 MG tablet Take 40 mg by mouth every morning.       celecoxib (CELEBREX) 200 MG capsule Take 200 mg by mouth at bedtime.        hydrALAZINE (APRESOLINE) 25 MG tablet Take 25 mg by mouth 3 (three) times daily.       magnesium hydroxide (MILK OF MAGNESIA) 400 MG/5ML suspension Take 5 mLs by mouth daily as needed for mild constipation.       metoprolol tartrate (LOPRESSOR) 25 MG tablet Take 25 mg by mouth 2 (two) times daily.       omeprazole (PRILOSEC) 20 MG capsule Take 20 mg by mouth at bedtime.       tamsulosin (FLOMAX) 0.4 MG CAPS capsule Take 0.4 mg by mouth 2 (two) times a day.        atorvastatin (LIPITOR) 40 MG tablet Take 40 mg by mouth at bedtime. (Patient not taking: Reported on 12/12/2020)       bimatoprost (LUMIGAN) 0.01 % SOLN Place 1 drop into both eyes at bedtime. (Patient not taking: Reported on 12/12/2020)       clopidogrel (PLAVIX) 75 MG tablet Take 75 mg by mouth daily. (Patient not taking: Reported on 12/12/2020)       docusate sodium (COLACE) 100 MG capsule Take 1 capsule (100 mg total) by mouth 2 (two) times daily. (Patient not taking: Reported on 12/12/2020) 60 capsule 1   rosuvastatin (CRESTOR) 20 MG tablet Take 20  mg by mouth at bedtime. (Patient not taking: Reported on 12/12/2020)          Drug Regimen Review  Drug regimen was reviewed and remains appropriate with no significant issues identified   Home: Home Living Family/patient expects to be discharged to:: Private residence Living Arrangements: Children (daughter lives with him) Available Help at Discharge: Family, Available 24 hours/day Type of Home: House Home Access: Other (comment) (threshold) Home Layout: One level Bathroom Shower/Tub: Chiropodist: Handicapped height Bathroom Accessibility: Yes Home  Equipment: Tub bench, Conservation officer, nature (2 wheels), [employer] - single point, BSC/3in1  Lives With: Daughter   Functional History: Prior Function Prior Level of Function : Independent/Modified Independent, Driving Mobility Comments: Pt could ambulate in community; likes to play golf ADLs Comments: Independent with ADLs, IADLs,  finances, and medication   Functional Status:  Mobility: Bed Mobility Overal bed mobility: Needs Assistance Bed Mobility: Supine to Sit Rolling: Min assist Sidelying to sit: HOB elevated, Mod assist Supine to sit: Min assist Sit to supine: Mod assist General bed mobility comments: Min A for trunk elevation to come to sitting Transfers Overall transfer level: Needs assistance Equipment used: 1 person hand held assist Transfers: Sit to/from Stand, Bed to chair/wheelchair/BSC Sit to Stand: Mod assist Bed to/from chair/wheelchair/BSC transfer type:: Step pivot Step pivot transfers: Mod assist General transfer comment: Required assist for lift assist and steadying. ASsist with weightshifting to take steps. Mild buckling noted in LLE. Ambulation/Gait Ambulation/Gait assistance: Mod assist, +2 safety/equipment Gait Distance (Feet): 20 Feet Assistive device: Rolling walker (2 wheels) Gait Pattern/deviations: Step-to pattern, Decreased step length - left, Decreased dorsiflexion - left, Decreased  stride length, Trunk flexed General Gait Details: mod assist due to left leaning with ambualtion/standing. Cues for upright posture, head up and looking straight ahead. Max cues for sequencing with walker. Assist needed to maintain left hand on walker. Family present in room and able to assist with cardiac monitor during mobility. patient has difficulty with coordination of left LE and foot placement. Gait velocity: decreased Gait velocity interpretation: <1.31 ft/sec, indicative of household ambulator Pre-gait activities: attempted to march in place however pt unable to raise R LE despite max verbal cues and blocking of L knee   ADL: ADL Overall ADL's : Needs assistance/impaired Eating/Feeding: NPO Grooming: Minimal assistance, Sitting, Wash/dry hands Grooming Details (indicate cue type and reason): requires cues and assistance for bimanual tasks Upper Body Bathing: Moderate assistance Lower Body Bathing: Maximal assistance Upper Body Dressing : Moderate assistance Lower Body Dressing: Moderate assistance, Sit to/from stand Toilet Transfer: Moderate assistance, +2 for physical assistance Toileting- Clothing Manipulation and Hygiene: Maximal assistance Toileting - Clothing Manipulation Details (indicate cue type and reason): assistance for posterior care in standing Functional mobility during ADLs: Moderate assistance General ADL Comments: pt's catheter appeared to leak, pt required max A for posterior care in standing;   Cognition: Cognition Overall Cognitive Status: Impaired/Different from baseline Arousal/Alertness: Awake/alert Orientation Level: Oriented X4 Year: 2022 Month: December Day of Week: Correct Attention: Focused, Sustained Focused Attention: Appears intact Sustained Attention: Appears intact Memory: Impaired Memory Impairment: Storage deficit, Retrieval deficit, Decreased short term memory Decreased Short Term Memory:  (Immediate: 5/5) Awareness: Appears  intact Problem Solving: Impaired Problem Solving Impairment: Verbal complex Executive Function: Sequencing, Organizing Sequencing: Impaired Sequencing Impairment: Verbal complex (clock drawing: 0/4) Organizing: Impaired Organizing Impairment: Verbal complex (backward digit span: 2/2 with additional processing time.) Cognition Arousal/Alertness: Awake/alert Behavior During Therapy: WFL for tasks assessed/performed Overall Cognitive Status: Impaired/Different from baseline Area of Impairment: Safety/judgement, Awareness, Problem solving, Attention Current Attention Level: Sustained Memory: Decreased short-term memory Safety/Judgement: Decreased awareness of safety, Decreased awareness of deficits Awareness: Emergent Problem Solving: Difficulty sequencing, Requires verbal cues, Requires tactile cues General Comments: motivated towards therapy, consistent reminders for attending to the left side of environment. cues for sequencing during weight shifting and while standing for postural correction.     Blood pressure (!) 132/45, pulse (!) 56, temperature 98 F (36.7 C), temperature source Oral, resp. rate 17, height 5\' 7"  (1.702 m),  weight 79 kg, SpO2 98 %. Physical Exam Vitals and nursing note reviewed. Exam conducted with a chaperone present.  Constitutional:      Appearance: Normal appearance.     Comments: WNWD and appears younger than stated age. Occasional wet voice but clears without difficulty.  Pt sitting up in bed; daughter at bedside; finished lunch; bright affect, NAD  HENT:     Head: Normocephalic and atraumatic.     Comments: Right temple with mass over eyebrow and small scab. L facial droop- moderate; tongue midline    Right Ear: External ear normal.     Left Ear: External ear normal.     Nose: Nose normal. No congestion.     Mouth/Throat:     Mouth: Mucous membranes are dry.     Pharynx: Oropharynx is clear. No oropharyngeal exudate.  Eyes:     General:        Right  eye: No discharge.        Left eye: No discharge.     Extraocular Movements: Extraocular movements intact.  Neck:     Comments: Right cervical incision C/D/I with skin glue in place. Non tender to touch. Very slightly swollen, but looks great  Cardiovascular:     Rate and Rhythm: Normal rate and regular rhythm.     Heart sounds: Normal heart sounds. No murmur heard.   No gallop.  Pulmonary:     Comments: A little coarse, cleared with clearing throat- good air movement Abdominal:     General: There is distension.     Tenderness: There is no abdominal tenderness.     Comments: Protuberant vs distended; normoactive; NT; soft  Musculoskeletal:     Comments: RUE_ 5/5 RLE_ 55 LUE_ biceps 4-/5; triceps 4-/5; WE 2-/5; grip 3-/5; FA 1/5 LLE- HF 3-/5, KE 3-/5, KF 3/5; DF 3/5; PF 4-/5 Difficult to extend R 2nd/3rd fingers- like trigger finger, however had since age 86.    Skin:    Comments: 3 IV's noted- L and R AC fossa- and R hand- look OK Trace swelling in L hand  Neurological:     Mental Status: He is alert and oriented to person, place, and time.     Comments: Mild left facial weakness, speech clear but occasional wet voice noted. He tends to lean to left with right gaze preference but able to scan to the left without difficulty. Mild left inattention with left hemiplegia.  Intact to light touch in all 4 extremities L inattention  Psychiatric:        Mood and Affect: Mood normal.        Behavior: Behavior normal.      Lab Results Last 48 Hours        Results for orders placed or performed during the hospital encounter of 12/12/20 (from the past 48 hour(s))  Basic metabolic panel     Status: Abnormal    Collection Time: 12/20/20  5:10 AM  Result Value Ref Range    Sodium 137 135 - 145 mmol/L    Potassium 4.5 3.5 - 5.1 mmol/L    Chloride 104 98 - 111 mmol/L    CO2 25 22 - 32 mmol/L    Glucose, Bld 104 (H) 70 - 99 mg/dL      Comment: Glucose reference range applies only to  samples taken after fasting for at least 8 hours.    BUN 11 8 - 23 mg/dL    Creatinine, Ser 0.93 0.61 - 1.24 mg/dL  Calcium 9.0 8.9 - 10.3 mg/dL    GFR, Estimated >60 >60 mL/min      Comment: (NOTE) Calculated using the CKD-EPI Creatinine Equation (2021)      Anion gap 8 5 - 15      Comment: Performed at [facility], [place] [address], [place], [place] [address]  CBC     Status: Abnormal    Collection Time: 12/20/20  5:10 AM  Result Value Ref Range    WBC 12.1 (H) 4.0 - 10.5 K/uL    RBC 4.82 4.22 - 5.81 MIL/uL    Hemoglobin 13.0 13.0 - 17.0 g/dL    HCT 39.9 39.0 - 52.0 %    MCV 82.8 80.0 - 100.0 fL    MCH 27.0 26.0 - 34.0 pg    MCHC 32.6 30.0 - 36.0 g/dL    RDW 13.3 11.5 - 15.5 %    Platelets 240 150 - 400 K/uL    nRBC 0.0 0.0 - 0.2 %      Comment: Performed at [facility], [place] [address], [place], [place] [address]  Type and screen [place]     Status: None    Collection Time: 12/20/20 11:30 AM  Result Value Ref Range    ABO/RH(D) A POS      Antibody Screen NEG      Sample Expiration          12/23/2020,2359 Performed at [facility], [place] [address], [place], [place] [address]    POCT Activated clotting time     Status: None    Collection Time: 12/20/20  1:02 PM  Result Value Ref Range    Activated Clotting Time 269 seconds      Comment: Reference range 74-137 seconds for patients not on anticoagulant therapy.  CBC     Status: Abnormal    Collection Time: 12/21/20  4:35 AM  Result Value Ref Range    WBC 14.2 (H) 4.0 - 10.5 K/uL    RBC 3.95 (L) 4.22 - 5.81 MIL/uL    Hemoglobin 10.9 (L) 13.0 - 17.0 g/dL    HCT 32.5 (L) 39.0 - 52.0 %    MCV 82.3 80.0 - 100.0 fL    MCH 27.6 26.0 - 34.0 pg    MCHC 33.5 30.0 - 36.0 g/dL    RDW 13.2 11.5 - 15.5 %    Platelets 213 150 - 400 K/uL    nRBC 0.0 0.0 - 0.2 %      Comment: Performed at [facility], [place] [address], [place], [place] [address]  Basic metabolic panel     Status: Abnormal     Collection Time: 12/21/20  4:35 AM  Result Value Ref Range    Sodium 138 135 - 145 mmol/L    Potassium 4.1 3.5 - 5.1 mmol/L    Chloride 105 98 - 111 mmol/L    CO2 26 22 - 32 mmol/L    Glucose, Bld 123 (H) 70 - 99 mg/dL      Comment: Glucose reference range applies only to samples taken after fasting for at least 8 hours.    BUN 22 8 - 23 mg/dL    Creatinine, Ser 1.30 (H) 0.61 - 1.24 mg/dL    Calcium 8.6 (L) 8.9 - 10.3 mg/dL    GFR, Estimated 55 (L) >60 mL/min      Comment: (NOTE) Calculated using the CKD-EPI Creatinine Equation (2021)      Anion gap  7 5 - 15      Comment: Performed at [facility], [place] [address] [place], [place] [address]       Imaging Results (Last 48 hours)  VAS US CAROTID   Result Date: 12/20/2020 Carotid Arterial Duplex Study Patient Name:  KAILO KOSIK  Date of Exam:   12/20/2020 Medical Rec #: [id number]       Accession #:    [id number] Date of Birth: 07/28/1935       Patient Gender: M Patient Age:   69 years Exam Location:  [facility] Procedure:      VAS US CAROTID Referring Phys: Harrell Gave DICKSON --------------------------------------------------------------------------------  Indications:       Check patency post surgical procedure. Other Factors:     12/20/2020 - RIGHT CAROTID ENDARTERECTOMY                    PATCH ANGIOPLASTY USING XENOSURE BIOLOGIC PATCH. Comparison Study:  12/15/2020 - Right Carotid: Velocities in the right ICA are                    consistent with a 60-79% stenosis. Non-hemodynamically                    significant plaque <50% noted in the CCA. The ECA appears                    >50% stenosed.                     Left Carotid: Velocities in the left ICA are consistent with                    a 1-39%                    stenosis. Non-hemodynamically significant plaque <50% noted                    in the CCA. The ECA appears >50% stenosed.                     Vertebrals: Bilateral vertebral arteries demonstrate                     antegrade flow.                    Subclavians: Normal flow hemodynamics were seen in bilateral                    subclavian arteries. Performing Technologist: Oliver Hum RVT  Examination Guidelines: A complete evaluation includes B-mode imaging, spectral Doppler, color Doppler, and power Doppler as needed of all accessible portions of each vessel. Bilateral testing is considered an integral part of a complete examination. Limited examinations for reoccurring indications may be performed as noted.  Right Carotid Findings: +----------+--------+--------+--------+-----------------------+--------+             PSV cm/s EDV cm/s Stenosis Plaque Description      Comments  +----------+--------+--------+--------+-----------------------+--------+  CCA Prox   80       12                smooth and heterogenous           +----------+--------+--------+--------+-----------------------+--------+  CCA Distal 37       7  smooth and heterogenous           +----------+--------+--------+--------+-----------------------+--------+  ICA Prox   55       11                smooth and heterogenous           +----------+--------+--------+--------+-----------------------+--------+  ICA Distal 73       17                                                  +----------+--------+--------+--------+-----------------------+--------+ +---------+--------+--+--------+--+---------+  Vertebral PSV cm/s 69 EDV cm/s 11 Antegrade  +---------+--------+--+--------+--+---------+   Summary: Right Carotid: Velocities in the right ICA are consistent with a 1-39% stenosis.                The right ICA appears patent.  *See table(s) above for measurements and observations.  Electronically signed by Servando Snare MD on 12/20/2020 at 4:36:26 PM.    Final              Medical Problem List and Plan: 1. 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  Antithrombotics: -DVT/anticoagulation:  Pharmaceutical: Lovenox             -antiplatelet therapy: DAPT X 3 months followed by ASA alone.  3. Pain Management: Tylenol or oxycodone depending on severity of pain.  4. Mood: LCSW to follow for evaluation and support.              -antipsychotic agents: N/A 5. Neuropsych: This patient is capable of making decisions on his own behalf. 6. Skin/Wound Care: Routine pressure relief measures.              --monitor right neck incision for healing/edema.  7. Fluids/Electrolytes/Nutrition: Monitor I/O. Check lytes in am.  8. HTN: Monitor BP TID with SBP goals 110-120 to avoid hypoperfusion.              --On Norvasc and metoprolol BID.  --Will order orthostatic vitals to monitor for changes 9. AKI: SCr has risen from 1.03-->1.30. Likely due to dye/hypotension             --Encourage fluid intake. Will recheck SCr in am.  10. Leucocytosis: Likely reactive due to surgery. Will monitor for signs of infection 11. ABLA: Likely due to thrombolytics/surgery with drop in  Hgb 14.3--->10.9 --Will monitor with serial CBC for trend as on DAPT/Lovenox  12. Hyperlipidemia: LDL 111. Continue Cresor.  13. BPH: Continue Flomax. Will check PVRs as reporting hesitancy.  14. Post CEA odynophagia/dysphagia: He is ordering appropriate foods. Will monitor for now.  --he does not want diet adjusted at this time 15. Insomnia: Will increase melatonin to 5 mg. Family to spend night for support.  16. Chronic constipation- wants Milk of Mg every night at bedtime.  17. GERD- will increase Protonix- used Omeprazole at home.    I have personally performed a face to face diagnostic evaluation of this patient and formulated the key components of the plan.  Additionally, I have personally reviewed laboratory data, imaging studies, as well as  relevant notes and concur with the physician assistant's  documentation above.   The patient's status has not changed from the original H&P.  Any changes in documentation from the acute care chart have been noted above.       Bary Leriche, PA-C 12/21/2020

## 2020-12-21 NOTE — Care Management Important Message (Signed)
Important Message  Patient Details  Name: [name] MRN: [id number] Date of Birth: [birth year]   Medicare Important Message Given:  Yes     Shelda Altes 12/21/2020, 10:01 AM

## 2020-12-21 NOTE — Discharge Summary (Addendum)
Stroke Discharge Summary  Patient ID: Joel Peck   MRN: [id number]      DOB: [birth year]  Date of Admission: 12/12/2020 Date of Discharge: 12/21/2020  Attending Physician:  Stroke, Md, MD, Stroke MD Consultant(s):  Treatment Team:  Angelia Mould, MD cardiology and vascular surgery Patient's PCP:  Ronita Hipps, MD  Discharge Diagnoses: Scattetered ischemic strokes at right MCA/PCA border zone and right ACA territory, likely secondary to right ICA high grade stenosis Principal Problem:   Stroke determined by clinical assessment [facility]) Right carotid stenosis Active Problems:   Coronary artery disease involving native coronary artery of native heart without angina pectoris Hypertension Hyperlipidemia   Medications to be continued on Rehab Allergies as of 12/21/2020   No Known Allergies      Medication List     STOP taking these medications    atorvastatin 40 MG tablet Commonly known as: LIPITOR   benazepril 20 MG tablet Commonly known as: LOTENSIN   bimatoprost 0.01 % Soln Commonly known as: LUMIGAN   docusate sodium 100 MG capsule Commonly known as: COLACE   hydrALAZINE 25 MG tablet Commonly known as: APRESOLINE   omeprazole 20 MG capsule Commonly known as: PRILOSEC       TAKE these medications    acetaminophen 325 MG tablet Commonly known as: TYLENOL Take 650 mg by mouth every 6 (six) hours as needed for headache (pain).   alum & mag hydroxide-simeth 200-200-20 MG/5ML suspension Commonly known as: MAALOX/MYLANTA Take 15-30 mLs by mouth every 2 (two) hours as needed for indigestion.   amLODipine 10 MG tablet Commonly known as: NORVASC Take 1 tablet (10 mg total) by mouth daily. Start taking on: December 22, 2020 What changed:  medication strength how much to take when to take this   aspirin 325 MG EC tablet Take 1 tablet (325 mg total) by mouth daily. Start taking on: December 22, 2020 What changed:  medication strength how  much to take when to take this additional instructions   celecoxib 200 MG capsule Commonly known as: CELEBREX Take 200 mg by mouth at bedtime.   clopidogrel 75 MG tablet Commonly known as: PLAVIX Take 1 tablet (75 mg total) by mouth daily. Start taking on: December 22, 2020   guaiFENesin-dextromethorphan 100-10 MG/5ML syrup Commonly known as: ROBITUSSIN DM Take 15 mLs by mouth every 4 (four) hours as needed for cough.   magnesium hydroxide 400 MG/5ML suspension Commonly known as: MILK OF MAGNESIA Take 30 mLs by mouth daily. Start taking on: December 22, 2020 What changed:  how much to take when to take this reasons to take this   melatonin 3 MG Tabs tablet Take 1 tablet (3 mg total) by mouth at bedtime.   metoprolol tartrate 25 MG tablet Commonly known as: LOPRESSOR Take 1 tablet (25 mg total) by mouth 2 (two) times daily.   oxyCODONE-acetaminophen 5-325 MG tablet Commonly known as: PERCOCET/ROXICET Take 1-2 tablets by mouth every 4 (four) hours as needed for up to 5 days for moderate pain.   rosuvastatin 40 MG tablet Commonly known as: CRESTOR Take 1 tablet (40 mg total) by mouth at bedtime. What changed:  medication strength how much to take   tamsulosin 0.4 MG Caps capsule Commonly known as: FLOMAX Take 1 capsule (0.4 mg total) by mouth daily. Start taking on: December 22, 2020 What changed: when to take this        LABORATORY STUDIES CBC    Component Value Date/Time  WBC 14.2 (H) 12/21/2020 0435   RBC 3.95 (L) 12/21/2020 0435   HGB 10.9 (L) 12/21/2020 0435   HCT 32.5 (L) 12/21/2020 0435   PLT 213 12/21/2020 0435   MCV 82.3 12/21/2020 0435   MCH 27.6 12/21/2020 0435   MCHC 33.5 12/21/2020 0435   RDW 13.2 12/21/2020 0435   LYMPHSABS 3.4 12/12/2020 1745   MONOABS 0.7 12/12/2020 1745   EOSABS 0.1 12/12/2020 1745   BASOSABS 0.1 12/12/2020 1745   CMP    Component Value Date/Time   NA 138 12/21/2020 0435   K 4.1 12/21/2020 0435   CL 105  12/21/2020 0435   CO2 26 12/21/2020 0435   GLUCOSE 123 (H) 12/21/2020 0435   BUN 22 12/21/2020 0435   CREATININE 1.30 (H) 12/21/2020 0435   CALCIUM 8.6 (L) 12/21/2020 0435   PROT 6.3 (L) 12/12/2020 1745   ALBUMIN 3.6 12/12/2020 1745   AST 37 12/12/2020 1745   ALT 14 12/12/2020 1745   ALKPHOS 57 12/12/2020 1745   BILITOT 0.6 12/12/2020 1745   GFRNONAA 55 (L) 12/21/2020 0435   GFRAA >60 07/04/2018 0310   COAGS Lab Results  Component Value Date   INR 1.0 12/12/2020   Lipid Panel    Component Value Date/Time   CHOL 165 12/13/2020 0449   TRIG 28 12/13/2020 0449   HDL 48 12/13/2020 0449   CHOLHDL 3.4 12/13/2020 0449   VLDL 6 12/13/2020 0449   LDLCALC 111 (H) 12/13/2020 0449   HgbA1C  Lab Results  Component Value Date   HGBA1C 6.4 (H) 12/13/2020   Urinalysis    Component Value Date/Time   COLORURINE YELLOW 09/19/2016 1842   APPEARANCEUR CLEAR 09/19/2016 1842   LABSPEC 1.012 09/19/2016 1842   PHURINE 5.0 09/19/2016 1842   GLUCOSEU NEGATIVE 09/19/2016 1842   HGBUR NEGATIVE 09/19/2016 1842   BILIRUBINUR NEGATIVE 09/19/2016 1842   KETONESUR 5 (A) 09/19/2016 1842   PROTEINUR NEGATIVE 09/19/2016 1842   NITRITE NEGATIVE 09/19/2016 1842   LEUKOCYTESUR NEGATIVE 09/19/2016 1842   Urine Drug Screen No results found for: LABOPIA, COCAINSCRNUR, LABBENZ, AMPHETMU, THCU, LABBARB  Alcohol Level No results found for: ETH   SIGNIFICANT DIAGNOSTIC STUDIES CT HEAD WO CONTRAST (5MM)  Result Date: 12/13/2020 CLINICAL DATA:  Follow-up stroke in the right hemisphere. EXAM: CT HEAD WITHOUT CONTRAST TECHNIQUE: Contiguous axial images were obtained from the base of the skull through the vertex without intravenous contrast. COMPARISON:  CT and MRI studies done yesterday. FINDINGS: Brain: No abnormality is seen affecting the brainstem or cerebellum. Subtle low density is now visible at the site of acute infarction in the mesial right temporal lobe, hippocampus and inferior basal ganglia. No  sign of new ischemic insult. The brain otherwise shows age related volume loss with mild chronic small-vessel change of the white matter. No mass, hemorrhage, hydrocephalus or extra-axial collection. Vascular: There is atherosclerotic calcification of the major vessels at the base of the brain. Skull: Negative Sinuses/Orbits: Clear except for chronic inflammatory change of the diminutive left division of the sphenoid sinus. Orbits negative. Other: None IMPRESSION: Development of subtle low density in the mesial temporal lobe, hippocampus and inferior basal ganglia on the right at the site of acute infarction shown by MRI. No evidence of mass effect or hemorrhage. No new ischemic insult. Electronically Signed   By: Nelson Chimes M.D.   On: 12/13/2020 17:31   MR BRAIN WO CONTRAST  Result Date: 12/12/2020 CLINICAL DATA:  Initial evaluation for neuro deficit, stroke suspected. EXAM: MRI HEAD WITHOUT  CONTRAST TECHNIQUE: Multiplanar, multiecho pulse sequences of the brain and surrounding structures were obtained without intravenous contrast. COMPARISON:  Prior CTs from earlier the same day. FINDINGS: Brain: Cerebral volume within normal limits for age. Mild scattered patchy T2/FLAIR hyperintensity involving the periventricular white matter, nonspecific, but likely related chronic microvascular ischemic disease, fairly minimal in nature in felt to be within normal limits for age. Patchy restricted diffusion seen involving the posterior limb of the right internal capsule, with extension to involve the right periatrial white matter and mesial right temporal lobe, with involvement of the right hippocampus (series 5, images 73, 70, 68). No associated hemorrhage or mass effect. No other evidence for acute or subacute ischemia. Gray-white matter differentiation otherwise maintained with no other areas of chronic cortical infarction. No acute intracranial hemorrhage. Single punctate chronic microhemorrhage noted within the  right basal ganglia, of doubtful significance in isolation. No mass lesion, midline shift or mass effect. No hydrocephalus or extra-axial fluid collection. Pituitary gland suprasellar region within normal limits. Midline structures intact. Vascular: Major intracranial vascular flow voids are maintained. Skull and upper cervical spine: Craniocervical junction within normal limits. Bone marrow signal intensity diffusely decreased on T1 weighted imaging, nonspecific, but most commonly related to anemia, smoking, obesity. No focal marrow replacing lesion. Small lipoma noted at the right frontal scalp. Sinuses/Orbits: Patient status post bilateral ocular lens replacement. Globes and orbital soft tissues demonstrate no acute finding. Paranasal sinuses are largely clear. No mastoid effusion. Inner ear structures grossly normal. Other: None. IMPRESSION: 1. Patchy acute ischemic nonhemorrhagic infarct involving the posterior limb of the right internal capsule, right periatrial white matter, and mesial right temporal lobe. 2. Otherwise normal brain MRI for age. Electronically Signed   By: Jeannine Boga M.D.   On: 12/12/2020 23:37   CT HEAD CODE STROKE WO CONTRAST  Result Date: 12/12/2020 CLINICAL DATA:  Code stroke.  Slurred speech.  Left-sided weakness. EXAM: CT HEAD WITHOUT CONTRAST TECHNIQUE: Contiguous axial images were obtained from the base of the skull through the vertex without intravenous contrast. COMPARISON:  CT head 12/11/2020 FINDINGS: Brain: No evidence of acute infarction, hemorrhage, hydrocephalus, extra-axial collection or mass lesion/mass effect. Vascular: Negative for hyperdense vessel Skull: Negative Sinuses/Orbits: Paranasal sinuses clear. Bilateral cataract extraction Other: None ASPECTS ([place] Stroke Program Early CT Score) - Ganglionic level infarction (caudate, lentiform nuclei, internal capsule, insula, M1-M3 cortex): 7 - Supraganglionic infarction (M4-M6 cortex): 3 Total score (0-10  with 10 being normal): 10 IMPRESSION: 1. No acute abnormality and no change from yesterday ASPECTS is 10 2. These results were called by telephone at the time of interpretation on 12/12/2020 at 5:59 pm to provider Lorrin Goodell, who verbally acknowledged these results. Electronically Signed   By: Franchot Gallo M.D.   On: 12/12/2020 18:00   VAS US CAROTID  Result Date: 12/20/2020 Carotid Arterial Duplex Study Patient Name:  Joel Peck  Date of Exam:   12/20/2020 Medical Rec #: [id number]       Accession #:    [id number] Date of Birth: 30-Dec-1935       Patient Gender: M Patient Age:   85 years Exam Location:  [facility] Procedure:      VAS US CAROTID Referring Phys: Harrell Gave DICKSON --------------------------------------------------------------------------------  Indications:       Check patency post surgical procedure. Other Factors:     12/20/2020 - RIGHT CAROTID ENDARTERECTOMY                    PATCH ANGIOPLASTY USING  [place]. Comparison Study:  12/15/2020 - Right Carotid: Velocities in the right ICA are                    consistent with a 60-79% stenosis. Non-hemodynamically                    significant plaque <50% noted in the CCA. The ECA appears                    >50% stenosed.                     Left Carotid: Velocities in the left ICA are consistent with                    a 1-39%                    stenosis. Non-hemodynamically significant plaque <50% noted                    in the CCA. The ECA appears >50% stenosed.                     Vertebrals: Bilateral vertebral arteries demonstrate                    antegrade flow.                    Subclavians: Normal flow hemodynamics were seen in bilateral                    subclavian arteries. Performing Technologist: Oliver Hum RVT  Examination Guidelines: A complete evaluation includes B-mode imaging, spectral Doppler, color Doppler, and power Doppler as needed of all accessible portions of each vessel. Bilateral  testing is considered an integral part of a complete examination. Limited examinations for reoccurring indications may be performed as noted.  Right Carotid Findings: +----------+--------+--------+--------+-----------------------+--------+             PSV cm/s EDV cm/s Stenosis Plaque Description      Comments  +----------+--------+--------+--------+-----------------------+--------+  CCA Prox   80       12                smooth and heterogenous           +----------+--------+--------+--------+-----------------------+--------+  CCA Distal 37       7                 smooth and heterogenous           +----------+--------+--------+--------+-----------------------+--------+  ICA Prox   55       11                smooth and heterogenous           +----------+--------+--------+--------+-----------------------+--------+  ICA Distal 73       17                                                  +----------+--------+--------+--------+-----------------------+--------+ +---------+--------+--+--------+--+---------+  Vertebral PSV cm/s 69 EDV cm/s 11 Antegrade  +---------+--------+--+--------+--+---------+   Summary: Right Carotid: Velocities in the right ICA are consistent with a 1-39% stenosis.  The right ICA appears patent.  *See table(s) above for measurements and observations.  Electronically signed by Servando Snare MD on 12/20/2020 at 4:36:26 PM.    Final    VAS US CAROTID  Result Date: 12/15/2020 Carotid Arterial Duplex Study Patient Name:  Joel Peck  Date of Exam:   12/15/2020 Medical Rec #: [id number]       Accession #:    [id number] Date of Birth: [birth year]       Patient Gender: M Patient Age:   18 years Exam Location:  [facility] Procedure:      VAS US CAROTID Referring Phys: Harrell Gave DICKSON --------------------------------------------------------------------------------  Indications:       Pre-op carotid stenosis. Risk Factors:      Hypertension, past history of smoking, coronary  artery                    disease. Limitations        Today's exam was limited due to heavy calcification and the                    resulting shadowing. Comparison Study:  No previous carotid duplex exams. CTA on 12/12/2020 showed                    50% stenosis of RT ICA & >50% stenosis of RT CCA distal. Performing Technologist: Rogelia Rohrer RVT, RDMS  Examination Guidelines: A complete evaluation includes B-mode imaging, spectral Doppler, color Doppler, and power Doppler as needed of all accessible portions of each vessel. Bilateral testing is considered an integral part of a complete examination. Limited examinations for reoccurring indications may be performed as noted.  Right Carotid Findings: +----------+--------+--------+--------+---------------------+------------------+             PSV cm/s EDV cm/s Stenosis Plaque Description    Comments            +----------+--------+--------+--------+---------------------+------------------+  CCA Prox   84       11                                                          +----------+--------+--------+--------+---------------------+------------------+  CCA Distal 65       8                 heterogenous and      intimal thickening                                         smooth                                    +----------+--------+--------+--------+---------------------+------------------+  ICA Prox   300      27       60-79%   calcific, diffuse and Shadowing                                                  irregular                                 +----------+--------+--------+--------+---------------------+------------------+  ICA Mid    100      10                                                          +----------+--------+--------+--------+---------------------+------------------+  ICA Distal 67       11                                                          +----------+--------+--------+--------+---------------------+------------------+  ECA        345      12        >50%     irregular, calcific   Shadowing                                                  and diffuse                               +----------+--------+--------+--------+---------------------+------------------+ +----------+--------+-------+----------------+-------------------+             PSV cm/s EDV cms Describe         Arm Pressure (mmHG)  +----------+--------+-------+----------------+-------------------+  Subclavian 150              Multiphasic, WNL                      +----------+--------+-------+----------------+-------------------+ +---------+--------+--+--------+--+---------+  Vertebral PSV cm/s 65 EDV cm/s 11 Antegrade  +---------+--------+--+--------+--+---------+  Left Carotid Findings: +----------+--------+--------+--------+---------------------+------------------+             PSV cm/s EDV cm/s Stenosis Plaque Description    Comments            +----------+--------+--------+--------+---------------------+------------------+  CCA Prox   115      14                                                          +----------+--------+--------+--------+---------------------+------------------+  CCA Distal 99       17                heterogenous and      intimal thickening                                         smooth                                    +----------+--------+--------+--------+---------------------+------------------+  ICA Prox   137      23       1-39%    heterogenous and  hypoechoic                                +----------+--------+--------+--------+---------------------+------------------+  ICA Mid    84       19                                                          +----------+--------+--------+--------+---------------------+------------------+  ICA Distal 67       12                                                          +----------+--------+--------+--------+---------------------+------------------+  ECA        219      14        >50%                           Shadowing           +----------+--------+--------+--------+---------------------+------------------+ +----------+--------+--------+----------------+-------------------+             PSV cm/s EDV cm/s Describe         Arm Pressure (mmHG)  +----------+--------+--------+----------------+-------------------+  Subclavian 169               Multiphasic, WNL                      +----------+--------+--------+----------------+-------------------+ +---------+--------+--+--------+-+---------+  Vertebral PSV cm/s 36 EDV cm/s 7 Antegrade  +---------+--------+--+--------+-+---------+   Summary: Right Carotid: Velocities in the right ICA are consistent with a 60-79%                stenosis. Non-hemodynamically significant plaque <50% noted in                the CCA. The ECA appears >50% stenosed. Left Carotid: Velocities in the left ICA are consistent with a 1-39% stenosis.               Non-hemodynamically significant plaque <50% noted in the CCA. The               ECA appears >50% stenosed. Vertebrals:  Bilateral vertebral arteries demonstrate antegrade flow. Subclavians: Normal flow hemodynamics were seen in bilateral subclavian              arteries. *See table(s) above for measurements and observations.  Electronically signed by Deitra Mayo MD on 12/15/2020 at 4:04:22 PM.    Final    CT ANGIO HEAD NECK W WO CM (CODE STROKE)  Result Date: 12/12/2020 CLINICAL DATA:  Acute neuro deficit. Left-sided deficit. Slurred speech. EXAM: CT ANGIOGRAPHY HEAD AND NECK TECHNIQUE: Multidetector CT imaging of the head and neck was performed using the standard protocol during bolus administration of intravenous contrast. Multiplanar CT image reconstructions and MIPs were obtained to evaluate the vascular anatomy. Carotid stenosis measurements (when applicable) are obtained utilizing NASCET criteria, using the distal internal carotid diameter as the denominator. CONTRAST:  27mL OMNIPAQUE IOHEXOL  350 MG/ML SOLN COMPARISON:  CT head 12/12/2020.  CT angio head neck 12/11/2020 FINDINGS: CTA NECK FINDINGS Aortic arch: Atherosclerotic calcification  aortic arch and proximal great vessels. No significant stenosis. Right carotid system: Atherosclerotic calcification right carotid bifurcation. 55% diameter stenosis distal common carotid artery. Approximately 50% diameter stenosis proximal right internal carotid artery. Stenosis at the origin of the right external carotid artery Left carotid system: Atherosclerotic calcification left carotid bifurcation. 30% diameter stenosis distal left common carotid artery mild stenosis proximal left internal carotid artery. Vertebral arteries: Both vertebral arteries patent to the skull base without significant stenosis. Skeleton: Cervical spondylosis.  No acute abnormality. Other neck: Negative Upper chest: Lung apices clear bilaterally. Review of the MIP images confirms the above findings CTA HEAD FINDINGS Anterior circulation: Atherosclerotic calcification in the cavernous carotid bilaterally with mild to moderate stenosis bilaterally. Anterior and middle cerebral arteries patent bilaterally without stenosis or large vessel occlusion. Posterior circulation: Both vertebral arteries patent to the basilar. Mild stenosis distal vertebral artery bilaterally. Basilar widely patent. Superior cerebellar arteries patent bilaterally. Posterior cerebral arteries patent with mild proximal stenosis bilaterally. Negative for large vessel occlusion. Venous sinuses: Normal venous enhancement. Anatomic variants: None Review of the MIP images confirms the above findings IMPRESSION: 1. Negative for intracranial large vessel occlusion. 2. Mild PCA stenosis bilaterally. 3. Mild to moderate stenosis in the cavernous carotid bilaterally 4. 55% diameter stenosis distal common carotid artery on the right with 50% diameter stenosis proximal right internal carotid artery. 5. 30% diameter stenosis distal  left common carotid artery and mild stenosis proximal left internal carotid artery 6. Both vertebral arteries patent with mild stenosis distally. 7. These results were called by telephone at the time of interpretation on 12/12/2020 at 6:15 pm to provider [facility] , who verbally acknowledged these results. Electronically Signed   By: Franchot Gallo M.D.   On: 12/12/2020 18:28       HISTORY OF PRESENT ILLNESS 85 year old patient with history of hypertension, arthritis and CAD presented with and episode of left sided weakness.  HOSPITAL COURSE Patient was given TNK on arrival and found to have scattered infarcts at right MCA/PCA border zone and right ACA territory s/p TNK , likely secondary to large vessel source from right ICA high grade stenosis with MRI showing acute patchy ischemic infarct involving posterior limb of right internal capsule. CTA head and neck reveals 50% stenosis of the right ICA, and carotid ultrasound revealed 60-79% stenosis of right ICA. Hypertension was treated with amlodipine 10 mg dail and metoprolol 25 mg BID.  High intesity statin was started for LDL of 111. Patient underwent right CEA on 12/19 and was ready for discharge to CIR on 12/20.  DISCHARGE EXAM Blood pressure (!) 124/41, pulse 72, temperature 98.2 F (36.8 C), temperature source Oral, resp. rate 20, height 5\' 7"  (1.702 m), weight 79 kg, SpO2 96 %. Performed by physician  Discharge Diet      Diet   Diet Heart Room service appropriate? Yes; Fluid consistency: Thin   liquids  DISCHARGE PLAN Disposition:  Transfer to [place] for ongoing PT, OT and ST aspirin 81 mg daily and clopidogrel 75 mg daily for secondary stroke prevention for 3 weeks then aspirin alone. Recommend ongoing stroke risk factor control by Primary Care Physician at time of discharge from inpatient rehabilitation. Follow-up PCP Ronita Hipps, MD in 2 weeks following discharge from rehab. Follow-up in [facility] in 4 weeks following discharge from rehab, office to schedule an appointment.   35 minutes were spent preparing discharge.  [place] , MSN, AGACNP-BC  [facility] See Amion for schedule and pager information 12/21/2020 12:25 PM   I have personally obtained history,examined this patient, reviewed notes, independently viewed imaging studies, participated in medical decision making and plan of care.ROS completed by me personally and pertinent positives fully documented  I have made any additions or clarifications directly to the above note. Agree with note above.    Antony Contras, MD Medical Director [facility] Pager: [phone] 01/05/2021 4:43 PM

## 2020-12-21 NOTE — Progress Notes (Signed)
Called report to rehab nurse. D/c ivs.

## 2020-12-21 NOTE — Progress Notes (Signed)
Physical Therapy Treatment Patient Details Name: Joel Peck MRN: [id number] DOB: Apr 03, 1935 Today's Date: 12/21/2020   History of Present Illness 85 yo male admitted 12/12/20 with L facial droop, L weakness, and L hemianopsia. Pt found to have Patchy acute ischemic nonhemorrhagic infarct involving the posterior limb of the right internal capsule, right periatrial white matter, and mesial right temporal lobe.  Pt s/p tNKAase.  PMH: HTN, arthritis, Lt THA, and CAD.    PT Comments    Pt progressing towards goals. Continues to be very motivated to work with therapy. Min A for bed mobility using bed rail and mod A to stand and transfer to chair. Continues to demonstrate weakness and decreased coordination on LLE when taking steps. Performed seated HEP as well. Current recommendations appropriate. Will continue to follow acutely.    Recommendations for follow up therapy are one component of a multi-disciplinary discharge planning process, led by the attending physician.  Recommendations may be updated based on patient status, additional functional criteria and insurance authorization.  Follow Up Recommendations  Acute inpatient rehab (3hours/day)     Assistance Recommended at Discharge Frequent or constant Supervision/Assistance  Equipment Recommendations  Wheelchair (measurements PT);Wheelchair cushion (measurements PT)    Recommendations for Other Services Rehab consult     Precautions / Restrictions Precautions Precautions: Fall Restrictions Weight Bearing Restrictions: No     Mobility  Bed Mobility Overal bed mobility: Needs Assistance Bed Mobility: Supine to Sit     Supine to sit: Min assist     General bed mobility comments: Min A for trunk elevation to come to sitting    Transfers Overall transfer level: Needs assistance Equipment used: 1 person hand held assist Transfers: Sit to/from Stand;Bed to chair/wheelchair/BSC Sit to Stand: Mod assist     Step pivot  transfers: Mod assist     General transfer comment: Required assist for lift assist and steadying. ASsist with weightshifting to take steps. Mild buckling noted in LLE.    Ambulation/Gait                   Stairs             Wheelchair Mobility    Modified Rankin (Stroke Patients Only) Modified Rankin (Stroke Patients Only) Pre-Morbid Rankin Score: No symptoms Modified Rankin: Moderately severe disability     Balance Overall balance assessment: Needs assistance Sitting-balance support: Feet supported Sitting balance-Leahy Scale: Fair     Standing balance support: Single extremity supported Standing balance-Leahy Scale: Poor Standing balance comment: Reliant on at least 1 UE support                            Cognition Arousal/Alertness: Awake/alert Behavior During Therapy: WFL for tasks assessed/performed Overall Cognitive Status: Impaired/Different from baseline Area of Impairment: Safety/judgement;Awareness;Problem solving;Attention                   Current Attention Level: Sustained     Safety/Judgement: Decreased awareness of safety;Decreased awareness of deficits Awareness: Emergent Problem Solving: Difficulty sequencing;Requires verbal cues;Requires tactile cues          Exercises General Exercises - Lower Extremity Long Arc Quad: AROM;Both;10 reps (worked on slow controlled movement in LLE) Hip Flexion/Marching: AROM;Both;10 reps;Seated    General Comments        Pertinent Vitals/Pain Pain Assessment: No/denies pain    Home Living  Prior Function            PT Goals (current goals can now be found in the care plan section) Acute Rehab PT Goals Patient Stated Goal: to get back to playing golf PT Goal Formulation: With patient/family Time For Goal Achievement: 12/27/20 Potential to Achieve Goals: Good Progress towards PT goals: Progressing toward goals    Frequency     Min 4X/week      PT Plan Current plan remains appropriate    Co-evaluation              AM-PAC PT "6 Clicks" Mobility   Outcome Measure  Help needed turning from your back to your side while in a flat bed without using bedrails?: A Little Help needed moving from lying on your back to sitting on the side of a flat bed without using bedrails?: A Little Help needed moving to and from a bed to a chair (including a wheelchair)?: A Lot Help needed standing up from a chair using your arms (e.g., wheelchair or bedside chair)?: A Lot Help needed to walk in hospital room?: Total Help needed climbing 3-5 steps with a railing? : Total 6 Click Score: 12    End of Session Equipment Utilized During Treatment: Gait belt Activity Tolerance: Patient tolerated treatment well;Patient limited by fatigue Patient left: in chair;with call bell/phone within reach;with chair alarm set Nurse Communication: Mobility status PT Visit Diagnosis: Other abnormalities of gait and mobility (R26.89);Muscle weakness (generalized) (M62.81);Hemiplegia and hemiparesis;Difficulty in walking, not elsewhere classified (R26.2);Unsteadiness on feet (R26.81) Hemiplegia - Right/Left: Left Hemiplegia - dominant/non-dominant: Non-dominant Hemiplegia - caused by: Cerebral infarction     Time: 0730-0749 PT Time Calculation (min) (ACUTE ONLY): 19 min  Charges:  $Therapeutic Activity: 8-22 mins                     Lou Miner, DPT  Acute Rehabilitation Services  Pager: [phone] Office: [phone]    [name] 12/21/2020, 10:18 AM

## 2020-12-22 ENCOUNTER — Inpatient Hospital Stay (HOSPITAL_COMMUNITY): Payer: PPO

## 2020-12-22 DIAGNOSIS — R059 Cough, unspecified: Secondary | ICD-10-CM | POA: Diagnosis not present

## 2020-12-22 DIAGNOSIS — I639 Cerebral infarction, unspecified: Secondary | ICD-10-CM | POA: Diagnosis not present

## 2020-12-22 DIAGNOSIS — I63511 Cerebral infarction due to unspecified occlusion or stenosis of right middle cerebral artery: Secondary | ICD-10-CM | POA: Diagnosis not present

## 2020-12-22 LAB — CBC WITH DIFFERENTIAL/PLATELET
Abs Immature Granulocytes: 0.04 10*3/uL (ref 0.00–0.07)
Basophils Absolute: 0.1 10*3/uL (ref 0.0–0.1)
Basophils Relative: 1 %
Eosinophils Absolute: 0.6 10*3/uL — ABNORMAL HIGH (ref 0.0–0.5)
Eosinophils Relative: 5 %
HCT: 34.4 % — ABNORMAL LOW (ref 39.0–52.0)
Hemoglobin: 11.3 g/dL — ABNORMAL LOW (ref 13.0–17.0)
Immature Granulocytes: 0 %
Lymphocytes Relative: 17 %
Lymphs Abs: 2.1 10*3/uL (ref 0.7–4.0)
MCH: 27 pg (ref 26.0–34.0)
MCHC: 32.8 g/dL (ref 30.0–36.0)
MCV: 82.3 fL (ref 80.0–100.0)
Monocytes Absolute: 0.9 10*3/uL (ref 0.1–1.0)
Monocytes Relative: 8 %
Neutro Abs: 8.3 10*3/uL — ABNORMAL HIGH (ref 1.7–7.7)
Neutrophils Relative %: 69 %
Platelets: 234 10*3/uL (ref 150–400)
RBC: 4.18 MIL/uL — ABNORMAL LOW (ref 4.22–5.81)
RDW: 13.4 % (ref 11.5–15.5)
WBC: 12 10*3/uL — ABNORMAL HIGH (ref 4.0–10.5)
nRBC: 0 % (ref 0.0–0.2)

## 2020-12-22 LAB — COMPREHENSIVE METABOLIC PANEL
ALT: 10 U/L (ref 0–44)
AST: 28 U/L (ref 15–41)
Albumin: 3 g/dL — ABNORMAL LOW (ref 3.5–5.0)
Alkaline Phosphatase: 57 U/L (ref 38–126)
Anion gap: 4 — ABNORMAL LOW (ref 5–15)
BUN: 12 mg/dL (ref 8–23)
CO2: 27 mmol/L (ref 22–32)
Calcium: 8.6 mg/dL — ABNORMAL LOW (ref 8.9–10.3)
Chloride: 107 mmol/L (ref 98–111)
Creatinine, Ser: 0.9 mg/dL (ref 0.61–1.24)
GFR, Estimated: >60 mL/min (ref >60)
Glucose, Bld: 101 mg/dL — ABNORMAL HIGH (ref 70–99)
Potassium: 4.3 mmol/L (ref 3.5–5.1)
Sodium: 138 mmol/L (ref 135–145)
Total Bilirubin: 0.3 mg/dL (ref 0.3–1.2)
Total Protein: 5.9 g/dL — ABNORMAL LOW (ref 6.5–8.1)

## 2020-12-22 NOTE — Plan of Care (Signed)
°  Problem: RH Problem Solving Goal: LTG Patient will demonstrate problem solving for (SLP) Description: LTG:  Patient will demonstrate problem solving for basic/complex daily situations with cues  (SLP) Flowsheets (Taken 12/22/2020 2039) LTG Patient will demonstrate problem solving for: Modified Independent   Problem: RH Awareness Goal: LTG: Patient will demonstrate awareness during functional activites type of (SLP) Description: LTG: Patient will demonstrate awareness during functional activites type of (SLP) Flowsheets (Taken 12/22/2020 2039) Patient will demonstrate during cognitive/linguistic activities awareness type of: Anticipatory LTG: Patient will demonstrate awareness during cognitive/linguistic activities with assistance of (SLP): Modified Independent

## 2020-12-22 NOTE — Progress Notes (Signed)
Physical Therapy Session Note  Patient Details  Name: Joel Peck MRN: [id number] Date of Birth: March 25, 1935  Today's Date: 12/22/2020 PT Individual Time: 1500-1527 PT Individual Time Calculation (min): 27 min   Short Term Goals: Week 1:  PT Short Term Goal 1 (Week 1): Pt will complete bed mobility with minA PT Short Term Goal 2 (Week 1): Pt will complete bed<>chair transfers with modA PT Short Term Goal 3 (Week 1): Pt will ambulate 83f with modA and LRAD PT Short Term Goal 4 (Week 1): Pt will demonstrate improved L sided awareness  Skilled Therapeutic Interventions/Progress Updates:   Pt presents sitting in w/c, agreeable to PT tx. No reports of pain. Transported in w/c to day room rehab gym for time. Retrieved RW splint and this was attached to his RW. Completed sit<>stand from w/c with modA with assist for forward weight shift and boosting to rise. Completed stand<>pivot transfer with modA to mat table with assist for lateral weight shifting and mod cues for stepping strategies. At edge of mat, able to sit unsupported with SBA. Worked on repeated, 1x5, sit<>stands with L knee blocked and modA overall. While standing, worked on postural awareness/control where he required minA for standing balance while utilizing RW for support. Deferred pre-gait tasks due to postural deficits and focused on standing balance instead. Completed stand<>pivot transfer back to his w/c with modA with improved awareness of body and able to initiate stepping with paretic L side. Transported back to his room where he requested to return to bed due to fatigue. Chair to bed transfer in similar manner and required modA for sit>supine for LLE and trunk support. Concluded session supine in bed with all needs met, bed alarm on, call bell in reach.   Therapy Documentation Precautions:  Precautions Precautions: Fall Precaution Comments: L inattention/visual field cut, L hemi Restrictions Weight Bearing Restrictions:  No  Therapy/Group: Individual Therapy  Brytani Voth P Amberlynn Tempesta 12/22/2020, 3:01 PM

## 2020-12-22 NOTE — Progress Notes (Signed)
Inpatient Rehabilitation Care Coordinator Assessment and Plan Patient Details  Name: Joel Peck MRN: [id number] Date of Birth: 10/30/1935  Today's Date: 12/22/2020  Hospital Problems: Principal Problem:   Acute ischemic right MCA stroke [facility])  Past Medical History:  Past Medical History:  Diagnosis Date   Arthritis    Coronary artery disease    Hypertension    Past Surgical History:  Past Surgical History:  Procedure Laterality Date   CARDIAC CATHETERIZATION     ENDARTERECTOMY Right 12/20/2020   Procedure: RIGHT CAROTID ENDARTERECTOMY;  Surgeon: Angelia Mould, MD;  Location: [place];  Service: Vascular;  Laterality: Right;   PATCH ANGIOPLASTY Right 12/20/2020   Procedure: PATCH ANGIOPLASTY USING Rueben Bash BIOLOGIC PATCH;  Surgeon: Angelia Mould, MD;  Location: [place];  Service: Vascular;  Laterality: Right;   TOTAL HIP ARTHROPLASTY Left 07/03/2018   Procedure: TOTAL HIP ARTHROPLASTY ANTERIOR APPROACH;  Surgeon: Rod Can, MD;  Location: [facility];  Service: Orthopedics;  Laterality: Left;   Social History:  reports that he has quit smoking. He has never used smokeless tobacco. He reports that he does not drink alcohol and does not use drugs.  Family / Support Systems Marital Status: Widow/Widower Patient Roles: Parent, Other (Comment) (Sibling) Children: Joel Peck [phone]  Joel Peck [phone] Other Supports: Joel Peck [phone] Anticipated Caregiver: Joel Peck and other family members Ability/Limitations of Caregiver: Between all family memebrs can provide 24/7 care. Caregiver Availability: 24/7 Family Dynamics: Family is very close and pull togehter when one is in need. Pt has always been involved in their lives and done for them it is just there tunr now.  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 He hopes to get back to this level and get back to playing golf, which he enjoys Recent Psychosocial Issues: other health issues Psychiatric History: No history/issues-coping appropriately and able to express himself and verbalize his feelings and concerns. Will continue to evaluate and see if would benefit from seeing neuro-psych while here Substance Abuse History: No issues  Patient / Family Perceptions, Expectations & Goals Pt/Family understanding of illness & functional limitations: Pt and sister can explain his stroke and deficits. Both are optimistic he will do well here and recover from his stroke. He talks with MD daily and feels has a good understanding of his treatment plan going forward. Premorbid pt/family roles/activities: Father, siblng, uncle, retiree, home owner, church member, cousin, etc Anticipated changes in roles/activities/participation: resume Pt/family expectations/goals: Pt states: " I hope to do well and do for myself when I leave here." Sister states: " If it just takes motivation he is ready and will do well cause he will push himself."  [employer]:  None Premorbid Home Care/DME Agencies:  Other (Comment) (has rw, cane and 3 in 1) Transportation available at discharge: Pt drove PTA will rely upon family now Is the patient able to respond to transportation needs?: Yes In the past 12 months, has lack of transportation kept you from medical appointments or from getting medications?: No In the past 12 months, has lack of transportation kept you from meetings, work, or from getting things needed for daily living?: No  Discharge Planning Living Arrangements: Children Support Systems: Children, Other relatives, Friends/neighbors, Social worker community Type of Residence: Private residence Insurance Resources: Multimedia programmer (specify) (Health Team Advantage) Financial Resources: Social Security, Family Support Financial Screen Referred: No Living Expenses: Own Money Management: Patient, Family Does the patient have any problems obtaining your medications?: No Home Management: Daughter does home management and sister helps with cooking Patient/Family Preliminary Plans: Return home with daughter who lives with him. Numerous family members live arond one another and will assist him. Sister reports she can be there to assist also. Awaiting team's evaluations and and made aware could not give target DC date due to being his first day today. Care Coordinator Barriers to Discharge: Insurance for SNF coverage Care Coordinator Anticipated Follow Up Needs: HH/OP  Clinical Impression Very pleasant gentleman who is very motivated to return to his active lifestyle and independence. His family is very involved and supportive and will assist him at discharge. Family all live around one another and can provide 24/7 care if needed. Pt is hopeful he will not need this much care. Aware being evaluated today will know more regarding ELOS and goals in next day or two  Joel Peck, Joel Peck 12/22/2020, 12:03 PM

## 2020-12-22 NOTE — IPOC Note (Signed)
Overall Plan of Care [facility]) Patient Details Name: Joel Peck MRN: [id number] DOB: [birth year]  Admitting Diagnosis: Acute ischemic right MCA stroke [facility])  Hospital Problems: Principal Problem:   Acute ischemic right MCA stroke [facility])     Functional Problem List: Nursing Bladder, Bowel, Pain, Endurance, Medication Management, Safety  PT Balance, Endurance, Motor, Perception, Safety  OT Balance, Perception, Endurance, Motor, Vision  SLP Cognition  TR         Basic ADLs: OT Grooming, Bathing, Dressing, Toileting, Eating     Advanced  ADLs: OT       Transfers: PT Bed Mobility, Bed to Chair, Car, Manufacturing systems engineer, Metallurgist: PT Ambulation, Stairs     Additional Impairments: OT Fuctional Use of Upper Extremity  SLP Social Cognition   Problem Solving  TR      Anticipated Outcomes Item Anticipated Outcome  Self Feeding supervision  Swallowing      Basic self-care  CGA-supervision  Toileting  CGA   Bathroom Transfers CGA  Bowel/Bladder  manage bowel and bladder with mod I assist  Transfers  CGA  Locomotion  minA  Communication     Cognition  mod I  Pain  pain at or below level 4 with prn meds  Safety/Judgment  maintain safety with cues/reminders   Therapy Plan: PT Intensity: Minimum of 1-2 x/day ,45 to 90 minutes PT Frequency: 5 out of 7 days PT Duration Estimated Length of Stay: 2.5 - 3 weeks OT Intensity: Minimum of 1-2 x/day, 45 to 90 minutes OT Frequency: 5 out of 7 days OT Duration/Estimated Length of Stay: 3 weeks SLP Intensity: Minumum of 1-2 x/day, 30 to 90 minutes SLP Frequency: 3 to 5 out of 7 days SLP Duration/Estimated Length of Stay: 3 weeks   Due to the current state of emergency, patients may not be receiving their 3-hours of Medicare-mandated therapy.   Team Interventions: Nursing Interventions Bladder Management, Disease Management/Prevention, Medication Management, Discharge Planning, Pain Management,  Bowel Management, Patient/Family Education  PT interventions Ambulation/gait training, Training and development officer, Cognitive remediation/compensation, Community reintegration, Discharge planning, Disease management/prevention, DME/adaptive equipment instruction, Functional electrical stimulation, Functional mobility training, Neuromuscular re-education, Pain management, Patient/family education, Psychosocial support, Skin care/wound management, Splinting/orthotics, Stair training, Therapeutic Activities, Therapeutic Exercise, UE/LE Strength taining/ROM, UE/LE Coordination activities, Visual/perceptual remediation/compensation, Wheelchair propulsion/positioning  OT Interventions Balance/vestibular training, Discharge planning, Functional electrical stimulation, Pain management, Self Care/advanced ADL retraining, Therapeutic Activities, UE/LE Coordination activities, Cognitive remediation/compensation, Disease mangement/prevention, Functional mobility training, Patient/family education, Therapeutic Exercise, Visual/perceptual remediation/compensation, DME/adaptive equipment instruction, Community reintegration, Neuromuscular re-education, Psychosocial support, Splinting/orthotics, UE/LE Strength taining/ROM, Wheelchair propulsion/positioning  SLP Interventions Cognitive remediation/compensation, English as a second language teacher, Functional tasks, Patient/family education  TR Interventions    SW/CM Interventions Discharge Planning, Psychosocial Support, Patient/Family Education   Barriers to Discharge MD  Medical stability  Nursing Decreased caregiver support 1 level home with daughter  PT Home environment Child psychotherapist, Insurance underwriter for SNF coverage, [employer]    OT      SLP      SW Insurance for SNF coverage     Team Discharge Planning: Destination: PT-Home ,OT- Home , SLP-Home Projected Follow-up: PT-Outpatient PT, 24 hour supervision/assistance, OT-  Outpatient OT, SLP-None Projected Equipment Needs: PT-To be  determined, OT- To be determined, SLP-None recommended by SLP Equipment Details: PT- , OT-  Patient/family involved in discharge planning: PT- Patient,  OT-Patient, Family member/caregiver, SLP-Patient  MD ELOS: 12-14d Medical Rehab Prognosis:  Good Assessment: 85 year old male with history of CAD, BPH, HTN  who was admitted for overnight observation at [facility] on 12/11/20 for left sided weakness and left facial droop due to TIA and was discharged 12/12/20 on Plavix. He was admitted later that evening with left sided weakness, left facial droop and left hemianopsia. CTA head negative for LVO and showed 50% stenosis in right distal CCA and proximal R-ICA and 30% stenosis distal L- CCA. He received tNKAase. MRI brain done reveling patchy acute infarct involving posterior limb internal capsule, right periatrial white matter and mesial right temporal lobe. Follow up CT head showed development of low density in mesial temporal lobe, hippocampus and inferior basal ganglia.  Dr. 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 Scot Dock consulted for input and recommended surgical intervention.  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 Johnsie Cancel as felt to be stable. He underwent R-CEA on 12/19 by Dr. Scot Dock and in PACU noted to have minimal movement on the left. Stat carotid ultrasound showed patent R-ICA but blood pressures noted to be soft. Dr. Leonie Man evaluated patient and recommended bed rest as well as fluid bolus and to keep SBP 110-120 range. He is also appeared to be at risk for sleep apnea question of consideration of Sleep Smart study. He has had improvement in left sided weakness and is back to baseline today. Dr. Leonie Man recommended DAPT X 3 months followed by ASA alone. Patient with resultant left inattention with decreased postural reflexes, left sided weakness with decrease in coordination and delay in sequencing/problem solving. CIR recommended due to  functional decline.        See Team Conference Notes for weekly updates to the plan of care

## 2020-12-22 NOTE — Progress Notes (Signed)
[facility] Individual Statement of Services  Patient Name:  Joel Peck  Date:  12/22/2020  Welcome to the [facility].  Our goal is to provide you with an individualized program based on your diagnosis and situation, designed to meet your specific needs.  With this comprehensive rehabilitation program, you will be expected to participate in at least 3 hours of rehabilitation therapies Monday-Friday, with modified therapy programming on the weekends.  Your rehabilitation program will include the following services:  Physical Therapy (PT), Occupational Therapy (OT), Speech Therapy (ST), 24 hour per day rehabilitation nursing, Therapeutic Recreaction (TR), Care Coordinator, Rehabilitation Medicine, Nutrition Services, and Pharmacy Services  Weekly team conferences will be held on Wednesday to discuss your progress.  Your Inpatient Rehabilitation Care Coordinator will talk with you frequently to get your input and to update you on team discussions.  Team conferences with you and your family in attendance may also be held.  Expected length of stay: 2.5-3 weeks  Overall anticipated outcome: CGA-min level  Depending on your progress and recovery, your program may change. Your Inpatient Rehabilitation Care Coordinator will coordinate services and will keep you informed of any changes. Your Inpatient Rehabilitation Care Coordinator's name and contact numbers are listed  below.  The following services may also be recommended but are not provided by the [facility] will be made to provide these services after discharge if needed.  Arrangements include referral to agencies that provide these services.  Your insurance has been verified to be:  Health team Advantage Your primary doctor is:  Kennith Maes  Pertinent information will be  shared with your doctor and your insurance company.  Inpatient Rehabilitation Care Coordinator:  Ovidio Kin, [place] or Emilia Beck  Information discussed with and copy given to patient by: Elease Hashimoto, 12/22/2020, 12:05 PM

## 2020-12-22 NOTE — Evaluation (Addendum)
Speech Language Pathology Assessment and Plan  Patient Details  Name: Joel Peck MRN: [id number] Date of Birth: [birth year]  SLP Diagnosis: Cognitive Impairments  Rehab Potential: Excellent ELOS: 3 weeks   Today's Date: 12/22/2020 SLP Individual Time: 1300-1400 SLP Individual Time Calculation (min): 52 min  Hospital Problem: Principal Problem:   Acute ischemic right MCA stroke [facility])  Past Medical History:  Past Medical History:  Diagnosis Date   Arthritis    Coronary artery disease    Hypertension    Past Surgical History:  Past Surgical History:  Procedure Laterality Date   CARDIAC CATHETERIZATION     ENDARTERECTOMY Right 12/20/2020   Procedure: RIGHT CAROTID ENDARTERECTOMY;  Surgeon: Angelia Mould, MD;  Location: [place];  Service: Vascular;  Laterality: Right;   PATCH ANGIOPLASTY Right 12/20/2020   Procedure: PATCH ANGIOPLASTY USING Rueben Bash BIOLOGIC PATCH;  Surgeon: Angelia Mould, MD;  Location: [place];  Service: Vascular;  Laterality: Right;   TOTAL HIP ARTHROPLASTY Left 07/03/2018   Procedure: TOTAL HIP ARTHROPLASTY ANTERIOR APPROACH;  Surgeon: Rod Can, MD;  Location: [facility];  Service: Orthopedics;  Laterality: Left;    Assessment / Plan / Recommendation Clinical Impression Joel Peck is an 85 year old male with history of CAD, BPH, HTN who was admitted for overnight observation at [facility] on 12/11/20 for left sided weakness and left facial droop due to TIA and was discharged 12/12/20 on Plavix. He was admitted later that evening with left sided weakness, left facial droop and left hemianopsia. MRI brain done reveling patchy acute infarct involving posterior limb internal capsule, right periatrial white matter and mesial right temporal lobe. Follow up CT head showed development of low density in mesial temporal lobe, hippocampus and inferior basal ganglia. He underwent R-CEA on 12/19 by Dr. Scot Dock and in PACU noted to have minimal movement on the  left. He has had improvement in left sided weakness and is back to baseline today. Patient with resultant left inattention with decreased postural reflexes, left sided weakness with decrease in coordination and delay in sequencing/problem solving. CIR recommended due to functional decline.    SLP administered the [facility] Mental Status Examination (SLUMS) and patient scored 26/30 points with a score of 27 or above considered normal based on pt's educational history. Patient presented with mildly decreased complex problem solving and executive functions (planning, organizing, sequencing) though with with effective self monitoring which allowed him to correct errors which were successful at times. Scores are suggestive of improvement compared to initial SLP evaluation in acute care, where patient obtained a 22/30 on SLUMS assessment. Suspect patient is near baseline cognitively. Patient endorsed slight change in processing speed as compared to his baseline. Pt wishes to return to managing medications and financial management. Daughter reportedly completed the checks for bill payment. Patient's overall auditory comprehension and verbal expression appeared [facility] for all tasks assessed and patient was 100% intelligible at the conversation level. Patient appears to be tolerating regular textures and thin liquids without overt s/sx of aspiration. It should be noted he exhibited congested vocal quality and cough at baseline without presentation of PO intake and this did NOT appear attributed to oropharyngeal swallow. No further swallowing needs at this time. Patient would benefit from skilled SLP intervention to maximize cognitive functioning and overall functional independence prior to discharge.    Skilled Therapeutic Interventions          Pt participated in [facility] Status Examination (SLUMS) as well as further non-standardized  assessments of cognitive-linguistic, speech, and language  function. Clinical swallow assessment completed. Please see above.      SLP Assessment  Patient will need skilled Speech Lanaguage Pathology Services during CIR admission    Recommendations  SLP Diet Recommendations: Age appropriate regular solids;Thin Liquid Administration via: Cup;Straw Medication Administration: Whole meds with liquid Supervision: Patient able to self feed Compensations: Slow rate;Small sips/bites Oral Care Recommendations: Oral care BID Patient destination: Home Follow up Recommendations: None Equipment Recommended: None recommended by SLP    SLP Frequency 1 to 3 out of 7 days   SLP Duration  SLP Intensity  SLP Treatment/Interventions 3 weeks  Minumum of 1-2 x/day, 30 to 90 minutes  Cognitive remediation/compensation;Cueing hierarchy;Functional tasks;Patient/family education    Pain Pain Assessment Pain Scale: 0-10 Pain Score: Asleep Pain Intervention(s): Medication (See eMAR)  Prior Functioning Cognitive/Linguistic Baseline: Within functional limits Type of Home: House  Lives With: Daughter Available Help at Discharge: Family;Available 24 hours/day Education: HS Vocation: Retired  Programmer, systems Overall Cognitive Status: Impaired/Different from baseline Arousal/Alertness: Awake/alert Orientation Level: Oriented X4 Year: 2022 Month: December Day of Week: Correct Attention: Focused;Sustained Focused Attention: Appears intact Sustained Attention: Appears intact Memory: Impaired Memory Impairment: Decreased short term memory Awareness: Appears intact Problem Solving: Impaired Problem Solving Impairment: Verbal complex;Functional complex Executive Function: Sequencing;Organizing Sequencing: Appears intact Sequencing Impairment: Functional complex;Verbal complex Organizing: Impaired Organizing Impairment: Verbal complex Safety/Judgment: Appears intact  Comprehension Auditory Comprehension Overall Auditory Comprehension:  Appears within functional limits for tasks assessed Expression Expression Primary Mode of Expression: Verbal Verbal Expression Overall Verbal Expression: Appears within functional limits for tasks assessed Initiation: No impairment Repetition: No impairment Naming: No impairment Pragmatics: No impairment Written Expression Dominant Hand: Right Oral Motor Oral Motor/Sensory Function Overall Oral Motor/Sensory Function: Mild impairment Facial ROM: Reduced left;Suspected CN VII (facial) dysfunction Facial Symmetry: Abnormal symmetry left;Suspected CN VII (facial) dysfunction Facial Strength: Reduced left;Suspected CN VII (facial) dysfunction Facial Sensation: Within Functional Limits Lingual ROM: Reduced left;Suspected CN XII (hypoglossal) dysfunction Lingual Symmetry: Abnormal symmetry left;Suspected CN XII (hypoglossal) dysfunction Lingual Strength: Reduced Velum: Within Functional Limits Mandible: Within Functional Limits Motor Speech Overall Motor Speech: Appears within functional limits for tasks assessed Respiration: Within functional limits Phonation: Normal Resonance: Within functional limits Articulation: Within functional limitis Intelligibility: Intelligible Motor Planning: Witnin functional limits Motor Speech Errors: Not applicable  Care Tool Care Tool Cognition Ability to hear (with hearing aid or hearing appliances if normally used Ability to hear (with hearing aid or hearing appliances if normally used): 1. Minimal difficulty - difficulty in some environments (e.g. when person speaks softly or setting is noisy)   Expression of Ideas and Wants Expression of Ideas and Wants: 4. Without difficulty (complex and basic) - expresses complex messages without difficulty and with speech that is clear and easy to understand   Understanding Verbal and Non-Verbal Content Understanding Verbal and Non-Verbal Content: 3. Usually understands - understands most conversations, but  misses some part/intent of message. Requires cues at times to understand  Memory/Recall Ability Memory/Recall Ability : Current season;Location of own room;That he or she is in a hospital/hospital unit   Intelligibility: Intelligible  Bedside Swallowing Assessment General Date of Onset: 12/12/20 Previous Swallow Assessment: 12/13/20 Temperature Spikes Noted: No Respiratory Status: Room air History of Recent Intubation: No Behavior/Cognition: Alert;Cooperative;Pleasant mood Oral Cavity - Dentition: Edentulous;Dentures, top;Dentures, bottom Self-Feeding Abilities: Able to feed self Vision: Functional for self-feeding Baseline Vocal Quality: Other (comment) (congested) Volitional Cough: Strong;Congested Volitional Swallow: Able to elicit  Oral Care Assessment  Does patient have any of the following "high(er) risk" factors?: None of the above Does patient have any of the following "at risk" factors?: None of the above Patient is LOW RISK: Follow universal precautions (see row information) Ice Chips Ice chips: Not tested Thin Liquid Thin Liquid: Within functional limits Presentation: Straw;Self Fed Nectar Thick Nectar Thick Liquid: Not tested Honey Thick Honey Thick Liquid: Not tested Puree Puree: Within functional limits Solid Solid: Within functional limits Presentation: Self Fed BSE Assessment Risk for Aspiration Impact on safety and function: Mild aspiration risk  Short Term Goals: Week 1: SLP Short Term Goal 1 (Week 1): Patient will complete complex problem solving tasks with sup A verbal cues SLP Short Term Goal 2 (Week 1): Patient will demonstrate anticipatory awareness by identifying up to 3 activities/tasks he can safely participate in at home with sup A verbal cues SLP Short Term Goal 3 (Week 1): Patient will complete simulated medication/financial management tasks with sup A verbal cues to achieve 90% accuracy  Refer to Care Plan for Long Term  Goals  Recommendations for other services: None   Discharge Criteria: Patient will be discharged from SLP if patient refuses treatment 3 consecutive times without medical reason, if treatment goals not met, if there is a change in medical status, if patient makes no progress towards goals or if patient is discharged from hospital.  The above assessment, treatment plan, treatment alternatives and goals were discussed and mutually agreed upon: by patient  Patty Sermons 12/22/2020, 8:44 PM

## 2020-12-22 NOTE — Progress Notes (Addendum)
Patient ID: Joel Peck, male   DOB: 30-Apr-1935, 85 y.o.   MRN: [id number] Met with the patient and family member to introduce self and review role. Discussed rehab routine, team conference, therapy schedule and plan of care. Discussed secondary risk management including HTN, HLD *LDL 111), prediabetes (A1C 6.4) and DAPT x 3 months per MD then ASA solo. Also noted planned surgery on CEA post discharge. Constipation addressed howver no BM since 12/19; MOM administered. Also reviewed dietary modification recommendations for [facility] diet with increased magnesium and potassium. Continue to follow along to discharge to address educational needs and collaborate with the team to facilitate preparation for discharge. Margarito Liner, RN
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
## 2020-12-22 NOTE — Evaluation (Signed)
Physical Therapy Assessment and Plan  Patient Details  Name: Joel Peck MRN: [id number] Date of Birth: 1935/04/22  PT Diagnosis: Abnormal posture, Abnormality of gait, Cognitive deficits, Difficulty walking, Hemiparesis dominant, and Muscle weakness Rehab Potential: Good ELOS: 2.5 - 3 weeks   Today's Date: 12/22/2020 PT Individual Time: 1005-1100 PT Individual Time Calculation (min): 47 min    Hospital Problem: Principal Problem:   Acute ischemic right MCA stroke [facility])   Past Medical History:  Past Medical History:  Diagnosis Date   Arthritis    Coronary artery disease    Hypertension    Past Surgical History:  Past Surgical History:  Procedure Laterality Date   CARDIAC CATHETERIZATION     ENDARTERECTOMY Right 12/20/2020   Procedure: RIGHT CAROTID ENDARTERECTOMY;  Surgeon: Angelia Mould, MD;  Location: [place];  Service: Vascular;  Laterality: Right;   PATCH ANGIOPLASTY Right 12/20/2020   Procedure: PATCH ANGIOPLASTY USING Rueben Bash BIOLOGIC PATCH;  Surgeon: Angelia Mould, MD;  Location: [place];  Service: Vascular;  Laterality: Right;   TOTAL HIP ARTHROPLASTY Left 07/03/2018   Procedure: TOTAL HIP ARTHROPLASTY ANTERIOR APPROACH;  Surgeon: Rod Can, MD;  Location: [facility];  Service: Orthopedics;  Laterality: Left;    Assessment & Plan Clinical Impression: Patient isan 85 year old male with history of CAD, BPH, HTN who was admitted for overnight observation at [facility] on 12/11/20 for left sided weakness and left facial droop due to TIA and was discharged 12/12/20 on Plavix. He was admitted later that evening with left sided weakness, left facial droop and left hemianopsia. CTA head negative for LVO and showed 50% stenosis in right distal CCA and proximal R-ICA and 30% stenosis distal L- CCA. He received tNKAase. MRI brain done reveling patchy acute infarct involving posterior limb internal capsule, right periatrial white matter and mesial right temporal lobe.  Follow up CT head showed development of low density in mesial temporal lobe, hippocampus and inferior basal ganglia.  Dr. 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 Scot Dock consulted for input and recommended surgical intervention.  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 Johnsie Cancel as felt to be stable. He underwent R-CEA on 12/19 by Dr. Scot Dock and in PACU noted to have minimal movement on the left. Stat carotid ultrasound showed patent R-ICA but blood pressures noted to be soft. Dr. Leonie Man evaluated patient and recommended bed rest as well as fluid bolus and to keep SBP 110-120 range. He is also appeared to be at risk for sleep apnea question of consideration of Sleep Smart study. He has had improvement in left sided weakness and is back to baseline today. Dr. Leonie Man recommended DAPT X 3 months followed by ASA alone. Patient with resultant left inattention with decreased postural reflexes, left sided weakness with decrease in coordination and delay in sequencing/problem solving. CIR recommended due to functional decline.   Patient transferred to CIR on 12/21/2020 .   Patient currently requires mod/max with mobility secondary to muscle weakness, decreased cardiorespiratoy endurance, impaired timing and sequencing, unbalanced muscle activation, motor apraxia, and decreased motor planning, decreased visual perceptual skills, decreased visual motor skills, and field cut, decreased attention to left and decreased motor planning, decreased initiation, decreased attention, decreased awareness, decreased problem solving, and decreased safety awareness, and decreased sitting balance, decreased standing balance, decreased postural control, hemiplegia, and decreased balance strategies.  Prior to hospitalization, patient was independent  with mobility and lived with  Daughter in a House home.  Home access is  Other (comment) (threshold).  Patient will benefit  from skilled PT intervention to maximize safe functional mobility, minimize fall risk, and decrease caregiver burden for planned discharge home with 24 hour assist.  Anticipate patient will benefit from follow up OP at discharge.  PT - End of Session Activity Tolerance: Tolerates 10 - 20 min activity with multiple rests Endurance Deficit: Yes Endurance Deficit Description: Seated rest breaks b/w functional mobility tasks, related to LE weakness as well as cardio endurance deficits PT Assessment Rehab Potential (ACUTE/IP ONLY): Good PT Barriers to Discharge: Home environment access/layout;Insurance for SNF coverage;Weight PT Patient demonstrates impairments in the following area(s): Balance;Endurance;Motor;Perception;Safety PT Transfers Functional Problem(s): Bed Mobility;Bed to Chair;Car;Furniture PT Locomotion Functional Problem(s): Ambulation;Stairs PT Plan PT Intensity: Minimum of 1-2 x/day ,45 to 90 minutes PT Frequency: 5 out of 7 days PT Duration Estimated Length of Stay: 2.5 - 3 weeks PT Treatment/Interventions: Ambulation/gait training;Balance/vestibular training;Cognitive remediation/compensation;Community reintegration;Discharge planning;Disease management/prevention;DME/adaptive equipment instruction;Functional electrical stimulation;Functional mobility training;Neuromuscular re-education;Pain management;Patient/family education;Psychosocial support;Skin care/wound management;Splinting/orthotics;Stair training;Therapeutic Activities;Therapeutic Exercise;UE/LE Strength taining/ROM;UE/LE Coordination activities;Visual/perceptual remediation/compensation;Wheelchair propulsion/positioning PT Transfers Anticipated Outcome(s): CGA PT Locomotion Anticipated Outcome(s): minA PT Recommendation Follow Up Recommendations: Outpatient PT;24 hour supervision/assistance Patient destination: Home Equipment Recommended: To be determined   PT  Evaluation Precautions/Restrictions Precautions Precautions: Fall Precaution Comments: L inattention, L hemi Restrictions Weight Bearing Restrictions: No General Chart Reviewed: Yes Family/Caregiver Present: No  Pain Interference Pain Interference Pain Effect on Sleep: 0. Does not apply - I have not had any pain or hurting in the past 5 days Pain Interference with Therapy Activities: 0. Does not apply - I have not received rehabilitationtherapy in the past 5 days Pain Interference with Day-to-Day Activities: 1. 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 Enjoys golfing 3x/week Leisure: Hobbies-yes (Comment) Vision/Perception  Vision - History Ability to See in Adequate Light: 0 Adequate Vision - Assessment Eye Alignment: Within Functional Limits Ocular Range of Motion: Within Functional Limits Alignment/Gaze Preference: Head turned;Head tilt (R rotation, L tilt) Tracking/Visual Pursuits: Decreased smoothness of horizontal tracking;Decreased smoothness of vertical tracking Saccades: Decreased speed of saccadic movement Perception Perception: Impaired Inattention/Neglect: Does not attend to left side of body Praxis Praxis: Impaired Praxis Impairment Details: Motor planning  Cognition Overall Cognitive Status: Impaired/Different from baseline Arousal/Alertness: Awake/alert Orientation Level: Oriented X4 Attention: Focused;Sustained Focused Attention: Appears intact Sustained Attention: Appears  intact Problem Solving: Impaired Problem Solving Impairment: Verbal complex;Functional complex Sequencing: Impaired Sequencing Impairment: Functional complex;Verbal complex Safety/Judgment: Appears intact Sensation Sensation Light Touch: Appears Intact Hot/Cold: Not tested Proprioception: Impaired by gross assessment Stereognosis: Not tested Additional Comments: proprioceptive deficits in standing with LLE Coordination Gross Motor Movements are Fluid and Coordinated: No Coordination and Movement Description: L hemi, L inattention, and decreased postural awareness impacting movement patterns Heel Shin Test: Limited by hip flexor weakness Motor  Motor Motor: Hemiplegia;Motor apraxia;Abnormal postural alignment and control Motor - Skilled Clinical Observations: L hemi (dominant side)  Trunk/Postural Assessment  Cervical Assessment Cervical Assessment: Exceptions to [facility] (R cervical rotation and L lateral tilt.) Thoracic Assessment Thoracic Assessment: Exceptions to [facility] (rounded shoulders) Lumbar Assessment Lumbar Assessment: Exceptions to  WFL (posterior pelvic tilt) Postural Control Postural Control: Deficits on evaluation Righting Reactions: delayed  Balance Balance Balance Assessed: Yes Static Sitting Balance Static Sitting - Balance Support: Right upper extremity supported;Feet supported Static Sitting - Level of Assistance: 5: Stand by assistance Static Standing Balance Static Standing - Balance Support: Bilateral upper extremity supported Static Standing - Level of Assistance: 4: Min assist;3: Mod assist Dynamic Standing Balance Dynamic Standing - Balance Support: During functional activity Dynamic Standing - Level of Assistance: 3: Mod assist;2: Max assist Extremity Assessment  RLE Assessment RLE Assessment: Within Functional Limits LLE Assessment LLE Assessment: Exceptions to [facility] LLE Strength LLE Overall Strength: Deficits Left Hip Flexion: 2/5 Left Knee Flexion:  3-/5 Left Knee Extension: 3+/5 Left Ankle Dorsiflexion: 2+/5  Care Tool Care Tool Bed Mobility Roll left and right activity   Roll left and right assist level: Supervision/Verbal cueing    Sit to lying activity   Sit to lying assist level: Moderate Assistance - Patient 50 - 74%    Lying to sitting on side of bed activity   Lying to sitting on side of bed assist level: the ability to move from lying on the back to sitting on the side of the bed with no back support.: Moderate Assistance - Patient 50 - 74%     Care Tool Transfers Sit to stand transfer   Sit to stand assist level: Maximal Assistance - Patient 25 - 49%    Chair/bed transfer   Chair/bed transfer assist level: Maximal Assistance - Patient 25 - 49%     Physiological scientist transfer assist level: Maximal Assistance - Patient 25 - 49%      Care Tool Locomotion Ambulation   Assist level: Maximal Assistance - Patient 25 - 49% Assistive device: Parallel bars Max distance: 2f  Walk 10 feet activity Walk 10 feet activity did not occur: Safety/medical concerns       Walk 50 feet with 2 turns activity Walk 50 feet with 2 turns activity did not occur: Safety/medical concerns      Walk 150 feet activity Walk 150 feet activity did not occur: Safety/medical concerns      Walk 10 feet on uneven surfaces activity Walk 10 feet on uneven surfaces activity did not occur: Safety/medical concerns      Stairs Stair activity did not occur: Safety/medical concerns        Walk up/down 1 step activity Walk up/down 1 step or curb (drop down) activity did not occur: Safety/medical concerns      Walk up/down 4 steps activity Walk up/down 4 steps activity did not occur: Safety/medical concerns      Walk up/down 12 steps activity Walk up/down 12 steps activity did not occur: Safety/medical concerns      Pick up small objects from floor Pick up small object from the floor (from standing position) activity  did not occur: Safety/medical concerns      Wheelchair Is the patient using a wheelchair?: Yes     Wheelchair assist level: Dependent - Patient 0% Max wheelchair distance: 1530f Wheel 50 feet with 2 turns activity   Assist Level: Dependent - Patient 0%  Wheel 150 feet activity   Assist Level: Dependent - Patient 0%    Refer to Care Plan for Long Term Goals  SHORT TERM GOAL WEEK 1 PT Short Term Goal 1 (Week 1): Pt will complete bed mobility with minA PT Short Term Goal 2 (  Week 1): Pt will complete bed<>chair transfers with modA PT Short Term Goal 3 (Week 1): Pt will ambulate 60f with modA and LRAD PT Short Term Goal 4 (Week 1): Pt will demonstrate improved L sided awareness  Recommendations for other services: None   Skilled Therapeutic Intervention Mobility Bed Mobility Bed Mobility: Not assessed (pt in w/c on arrival and stayed in w/c at end of session) Transfers Transfers: Sit to Stand;Stand to Sit;Stand Pivot Transfers Sit to Stand: Moderate Assistance - Patient 50-74%;Maximal Assistance - Patient 25-49% Stand to Sit: Moderate Assistance - Patient 50-74%;Maximal Assistance - Patient 25-49% Stand Pivot Transfers: Maximal Assistance - Patient 25 - 49%;Moderate Assistance - Patient 50 - 74% Stand Pivot Transfer Details: Verbal cues for gait pattern;Verbal cues for precautions/safety;Verbal cues for technique;Verbal cues for safe use of DME/AE;Verbal cues for sequencing;Tactile cues for weight shifting;Tactile cues for sequencing;Tactile cues for posture;Visual cues for safe use of DME/AE;Manual facilitation for weight shifting;Manual facilitation for placement Transfer (Assistive device): 1 person hand held assist Locomotion  Gait Ambulation: Yes Gait Assistance: Moderate Assistance - Patient 50-74%;Maximal Assistance - Patient 25-49% Gait Distance (Feet): 8 Feet Assistive device: Parallel bars Gait Assistance Details: Tactile cues for weight shifting;Tactile cues for  sequencing;Tactile cues for posture;Tactile cues for placement;Verbal cues for precautions/safety;Verbal cues for gait pattern;Verbal cues for sequencing;Verbal cues for technique;Manual facilitation for weight bearing;Manual facilitation for placement;Manual facilitation for weight shifting Gait Gait: Yes Gait Pattern: Impaired Gait Pattern: Step-to pattern;Decreased step length - left;Decreased stance time - left;Decreased hip/knee flexion - left;Decreased dorsiflexion - left;Decreased weight shift to left;Left flexed knee in stance;Lateral trunk lean to left;Trunk rotated posteriorly on left;Trunk flexed;Narrow base of support;Poor foot clearance - left Gait velocity: decreased Stairs / Additional Locomotion Stairs: No Wheelchair Mobility Wheelchair Mobility: Yes Wheelchair Assistance: Dependent - Patient 0% Wheelchair Propulsion: Right upper extremity;Right lower extremity Wheelchair Parts Management: Needs assistance Distance: 1575f Skilled Intervention: Pt greeted sitting in w/c to start. Pt agreeable to PT evaluation. Denies pain. Very motivated to participate and eager to work with therapies. Oriented x4 and follows 3-step commands appropriately. Pt denies visual deficits since CVA but does have L inattention and possible L visual field cut. Sensation appears intact. Collaborated with patient regarding PT POC, PT goals, stroke recovery. Overall, pt requires modA for sit<>stand transfers within // bars, requires modA for forward/backward gait within // bars, and modA for stand<>pivot transfers. Primary gait deficits include narrow BOS, poor L foot clearance, decreased hip/knee flexion in swing, decreased awareness of guiding LUE on // bars, pelvic rotation L, and forward flexed trunk with L truncal lean. Pt concluded session seated in w/c with all needs met, immediate needs within reach.   Instructed pt in results of PT evaluation as detailed above, PT POC, rehab potential, rehab goals, and  discharge recommendations. Additionally discussed CIR's policies regarding fall safety and use of chair alarm and/or quick release belt. Pt verbalized understanding and in agreement. Will update pt's family members as they become available.   Discharge Criteria: Patient will be discharged from PT if patient refuses treatment 3 consecutive times without medical reason, if treatment goals not met, if there is a change in medical status, if patient makes no progress towards goals or if patient is discharged from hospital.  The above assessment, treatment plan, treatment alternatives and goals were discussed and mutually agreed upon: by patient  ChAlger SimonsT, DPT 12/22/2020, 12:59 PM

## 2020-12-22 NOTE — Progress Notes (Signed)
Inpatient Rehabilitation Admission Medication Review by a Pharmacist  A complete drug regimen review was completed for this patient to identify any potential clinically significant medication issues.  High Risk Drug Classes Is patient taking? Indication by Medication  Antipsychotic Yes Compazine for N/V  Anticoagulant Yes Lovenox for VTE ppx  Antibiotic No   Opioid Yes Percocet for pain  Antiplatelet Yes Plavix / aspirin for CVA  Hypoglycemics/insulin No   Vasoactive Medication Yes Norvasc, Lopressor for BP  Chemotherapy No   Other No      Type of Medication Issue Identified Description of Issue Recommendation(s)  Drug Interaction(s) (clinically significant)     Duplicate Therapy     Allergy     No Medication Administration End Date     Incorrect Dose     Additional Drug Therapy Needed     Significant med changes from prior encounter (inform family/care partners about these prior to discharge).    Other       Clinically significant medication issues were identified that warrant physician communication and completion of prescribed/recommended actions by midnight of the next day:  No  Pharmacist comments: None  Time spent performing this drug regimen review (minutes):  20 minutes   Tad Moore 12/22/2020 8:04 AM

## 2020-12-22 NOTE — Progress Notes (Signed)
Inpatient Rehabilitation  Patient information reviewed and entered into eRehab system by Nolan Tuazon M. Dianna Deshler, M.A., CCC/SLP, PPS Coordinator.  Information including medical coding, functional ability and quality indicators will be reviewed and updated through discharge.    

## 2020-12-22 NOTE — Evaluation (Addendum)
Occupational Therapy Assessment and Plan  Patient Details  Name: Joel Peck MRN: [id number] Date of Birth: [birth year]  OT Diagnosis: abnormal posture, apraxia, cognitive deficits, disturbance of vision, hemiplegia affecting non-dominant side, and muscle weakness (generalized) Rehab Potential: Rehab Potential (ACUTE ONLY): Good ELOS: 3 weeks   Today's Date: 12/22/2020 OT Individual Time: 0905-1005 OT Individual Time Calculation (min): 60 min     Hospital Problem: Principal Problem:   Acute ischemic right MCA stroke [facility])   Past Medical History:  Past Medical History:  Diagnosis Date   Arthritis    Coronary artery disease    Hypertension    Past Surgical History:  Past Surgical History:  Procedure Laterality Date   CARDIAC CATHETERIZATION     ENDARTERECTOMY Right 12/20/2020   Procedure: RIGHT CAROTID ENDARTERECTOMY;  Surgeon: Angelia Mould, MD;  Location: [place];  Service: Vascular;  Laterality: Right;   PATCH ANGIOPLASTY Right 12/20/2020   Procedure: PATCH ANGIOPLASTY USING Rueben Bash BIOLOGIC PATCH;  Surgeon: Angelia Mould, MD;  Location: [place];  Service: Vascular;  Laterality: Right;   TOTAL HIP ARTHROPLASTY Left 07/03/2018   Procedure: TOTAL HIP ARTHROPLASTY ANTERIOR APPROACH;  Surgeon: Rod Can, MD;  Location: [facility];  Service: Orthopedics;  Laterality: Left;    Assessment & Plan Clinical Impression: Patient is a Joel Peck is an 85 year old male with history of CAD, BPH, HTN who was admitted for overnight observation at [facility] on 12/11/20 for left sided weakness and left facial droop due to TIA and was discharged 12/12/20 on Plavix. He was admitted later that evening with left sided weakness, left facial droop and left hemianopsia. CTA head negative for LVO and showed 50% stenosis in right distal CCA and proximal R-ICA and 30% stenosis distal L- CCA. He received tNKAase. MRI brain done reveling patchy acute infarct involving posterior limb internal  capsule, right periatrial white matter and mesial right temporal lobe. Follow up CT head showed development of low density in mesial temporal lobe, hippocampus and inferior basal ganglia.  Dr. 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 Scot Dock consulted for input and recommended surgical intervention.  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 Johnsie Cancel as felt to be stable. He underwent R-CEA on 12/19 by Dr. Scot Dock and in PACU noted to have minimal movement on the left. Stat carotid ultrasound showed patent R-ICA but blood pressures noted to be soft. Dr. Leonie Man evaluated patient and recommended bed rest as well as fluid bolus and to keep SBP 110-120 range. He is also appeared to be at risk for sleep apnea question of consideration of Sleep Smart study. He has had improvement in left sided weakness and is back to baseline today. Dr. Leonie Man recommended DAPT X 3 months followed by ASA alone.  Patient transferred to CIR on 12/21/2020 .    Patient currently requires mod with basic self-care skills secondary to muscle weakness, decreased cardiorespiratoy endurance, motor apraxia, decreased coordination, and decreased motor planning, decreased visual perceptual skills, decreased visual motor skills, and field cut, decreased midline orientation, decreased attention to left, left side neglect, and decreased motor planning, decreased problem solving, and decreased sitting balance, decreased standing balance, decreased postural control, hemiplegia, and decreased balance strategies.  Prior to hospitalization, patient could complete ADLs and IADLs with independent .  Patient will benefit from skilled intervention to increase independence with basic self-care skills prior to discharge home with care partner.  Anticipate  patient will require 24 hour supervision and follow up outpatient. ° °OT - End of Session °Activity Tolerance: Tolerates 30+ min activity with  multiple rests °Endurance Deficit: Yes °Endurance Deficit Description: Seated rest breaks b/w functional mobility tasks, related to LE weakness as well as cardio endurance deficits °OT Assessment °Rehab Potential (ACUTE ONLY): Good °OT Patient demonstrates impairments in the following area(s): Balance;Perception;Endurance;Motor;Vision °OT Basic ADL's Functional Problem(s): Grooming;Bathing;Dressing;Toileting;Eating °OT Transfers Functional Problem(s): Toilet;Tub/Shower °OT Additional Impairment(s): Fuctional Use of Upper Extremity °OT Plan °OT Intensity: Minimum of 1-2 x/day, 45 to 90 minutes °OT Frequency: 5 out of 7 days °OT Duration/Estimated Length of Stay: 3 weeks °OT Treatment/Interventions: Balance/vestibular training;Discharge planning;Functional electrical stimulation;Pain management;Self Care/advanced ADL retraining;Therapeutic Activities;UE/LE Coordination activities;Cognitive remediation/compensation;Disease mangement/prevention;Functional mobility training;Patient/family education;Therapeutic Exercise;Visual/perceptual remediation/compensation;DME/adaptive equipment instruction;Community reintegration;Neuromuscular re-education;Psychosocial support;Splinting/orthotics;UE/LE Strength taining/ROM;Wheelchair propulsion/positioning °OT Self Feeding Anticipated Outcome(s): supervision °OT Basic Self-Care Anticipated Outcome(s): CGA-supervision °OT Toileting Anticipated Outcome(s): CGA °OT Bathroom Transfers Anticipated Outcome(s): CGA °OT Recommendation °Patient destination: Home °Follow Up Recommendations: Outpatient OT °Equipment Recommended: To be determined ° ° °OT Evaluation °Precautions/Restrictions  °Precautions °Precautions: Fall °Precaution Comments: L inattention/visual field cut, L hemi °Restrictions °Weight Bearing Restrictions: No °General °Chart Reviewed: Yes °Pain °Pain Assessment °Pain Scale: 0-10 °Pain Score: 0-No pain °Home Living/Prior Functioning °Home Living °Living Arrangements:  Children °Available Help at Discharge: Family, Available 24 hours/day °Type of Home: House °Home Access: Other (comment) (threshold) °Home Layout: One level °Bathroom Shower/Tub: Tub/shower unit °Bathroom Toilet: Handicapped height °Bathroom Accessibility: Yes ° Lives With: Daughter °IADL History °Current License: Yes °Mode of Transportation: Car °Occupation: Retired °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 Enjoys golfing 3x/week °Leisure: Hobbies-yes (Comment) °Vision °Baseline Vision/History: 1 Wears glasses (for reading) °Ability to See in Adequate Light: 0 Adequate °Patient Visual Report: Peripheral vision impairment (left) °Vision Assessment?: Vision impaired- to be further tested in functional context;Yes °Eye Alignment: Within Functional Limits °Ocular Range of Motion: Within Functional Limits °Alignment/Gaze Preference: Head turned;Head tilt (left) °Tracking/Visual Pursuits: Decreased smoothness of horizontal tracking;Decreased smoothness of vertical tracking;Requires cues, head turns, or add eye shifts to track °Saccades: Decreased speed of saccadic movement;Additional eye shifts occurred during testing °Visual Fields: Left visual field deficit °Perception  °Perception: Impaired °Inattention/Neglect: Does not attend to left side of body °Praxis °Praxis: Impaired °Praxis Impairment Details: Motor planning °Cognition °Overall Cognitive Status: Impaired/Different from baseline °Arousal/Alertness: Awake/alert °Orientation Level: Person;Place;Situation °Person: Oriented °Place: Oriented °Situation: Oriented °Year: 2022 °Month: December °Day of Week:  Correct °Memory: Appears intact °Immediate Memory Recall: Sock;Blue;Bed °Memory Recall Sock: Without Cue °Memory Recall Blue: Without Cue °Memory Recall Bed: Without Cue °Attention: Focused;Sustained °Focused Attention: Appears intact °Sustained Attention: Appears intact °Awareness: Appears intact °Problem Solving: Impaired °Problem Solving Impairment: Verbal complex;Functional complex °Sequencing: Appears intact °Sequencing Impairment: Functional complex;Verbal complex °Organizing: Appears intact °Safety/Judgment: Appears intact °Sensation °Sensation °Light Touch: Appears Intact °Hot/Cold: Appears Intact °Proprioception: Impaired by gross assessment °Stereognosis: Not tested °Additional Comments: proprioceptive deficits in standing with LLE °Coordination °Gross Motor Movements are Fluid and Coordinated: No °Fine Motor Movements are Fluid and Coordinated: No °Coordination and Movement Description: L hemi, L inattention, and decreased postural awareness impacting movement patterns °Finger Nose Finger Test: UTA LUE due to weakness; WNL RUE °Heel Shin Test: Limited by hip flexor weakness °  Motor  Motor Motor: Hemiplegia;Motor apraxia;Abnormal postural alignment and control Motor - Skilled Clinical Observations: L hemi  Trunk/Postural Assessment  Cervical Assessment Cervical Assessment: Exceptions to [facility] (left cervical rotation and lateral tilt) Thoracic Assessment Thoracic Assessment: Exceptions to [facility] (rounded shoulders) Lumbar Assessment Lumbar Assessment: Exceptions to [facility] (posterior pelvic tilt) Postural Control Postural Control: Deficits on evaluation Righting Reactions: delayed  Balance Balance Balance Assessed: Yes Static Sitting Balance Static Sitting - Balance Support: Right upper extremity supported;Feet supported Static Sitting - Level of Assistance: 5: Stand by assistance Static Standing Balance Static Standing - Balance Support: Right upper extremity supported Static Standing - Level  of Assistance: 4: Min assist;3: Mod assist Dynamic Standing Balance Dynamic Standing - Balance Support: During functional activity Dynamic Standing - Level of Assistance: 3: Mod assist;2: Max assist Extremity/Trunk Assessment RUE Assessment RUE Assessment: Within Functional Limits LUE Assessment LUE Assessment: Exceptions to [facility] Passive Range of Motion (PROM) Comments: WNL within hemi shoulder precautions Active Range of Motion (AROM) Comments: partial AROM due to weakness General Strength Comments: Shoulder abduction/FF 2-/5, ER/IR 2+/5, elbow flex 3-/5, ext 3/5, forearm pro/sup 2-/5, wrist ext/flex 2-/5, digital flex 2-/5 ext 1/5 LUE Body System: Neuro Brunstrum levels for arm and hand: Arm;Hand Brunstrum level for arm: Stage II Synergy is developing Brunstrum level for hand: Stage II Synergy is developing  Care Tool Care Tool Self Care Eating   Eating Assist Level: Minimal Assistance - Patient > 75%    Oral Care    Oral Care Assist Level: Minimal Assistance - Patient > 75%    Bathing         Assist Level: Moderate Assistance - Patient 50 - 74%    Upper Body Dressing(including orthotics)       Assist Level: Moderate Assistance - Patient 50 - 74%    Lower Body Dressing (excluding footwear)     Assist for lower body dressing: Moderate Assistance - Patient 50 - 74%    Putting on/Taking off footwear     Assist for footwear: Moderate Assistance - Patient 50 - 74%       Care Tool Toileting Toileting activity   Assist for toileting: Moderate Assistance - Patient 50 - 74%     Care Tool Bed Mobility Roll left and right activity   Roll left and right assist level: Supervision/Verbal cueing    Sit to lying activity   Sit to lying assist level: Moderate Assistance - Patient 50 - 74%    Lying to sitting on side of bed activity   Lying to sitting on side of bed assist level: the ability to move from lying on the back to sitting on the side of the bed with no back  support.: Moderate Assistance - Patient 50 - 74%     Care Tool Transfers Sit to stand transfer   Sit to stand assist level: Maximal Assistance - Patient 25 - 49%    Chair/bed transfer   Chair/bed transfer assist level: Maximal Assistance - Patient 25 - 49%     Toilet transfer   Assist Level: Moderate Assistance - Patient 50 - 74%     Care Tool Cognition  Expression of Ideas and Wants Expression of Ideas and Wants: 4. Without difficulty (complex and basic) - expresses complex messages without difficulty and with speech that is clear and easy to understand  Understanding Verbal and Non-Verbal Content Understanding Verbal and Non-Verbal Content: 3. Usually understands - understands most conversations, but misses some part/intent of message. Requires cues at times  to understand   Memory/Recall Ability Memory/Recall Ability : Current season;Location of own room;Staff names and faces;That he or she is in a hospital/hospital unit   Refer to Care Plan for [place] 1 OT Short Term Goal 1 (Week 1): Pt will demonstrate static standing balance with min assist for self care prep. OT Short Term Goal 2 (Week 1): Pt will use LUE as stabilizer with mod multimodal cues. OT Short Term Goal 3 (Week 1): Pt will donn shirt with min assist and min multimodal cues. OT Short Term Goal 4 (Week 1): Pt will complete toilet transfer with min assist.  Recommendations for other services: None    Skilled Therapeutic Intervention ADL ADL Upper Body Bathing: Moderate assistance;Minimal cueing Where Assessed-Upper Body Bathing: Edge of bed Lower Body Bathing: Moderate assistance;Moderate cueing Where Assessed-Lower Body Bathing: Edge of bed Upper Body Dressing: Moderate assistance;Moderate cueing Where Assessed-Upper Body Dressing: Edge of bed Lower Body Dressing: Moderate assistance;Moderate cueing Where Assessed-Lower Body Dressing: Edge of bed Toileting: Minimal assistance  (urinal) Where Assessed-Toileting: Bed level Mobility  Bed Mobility Bed Mobility: Rolling Right;Right Sidelying to Sit Rolling Right: Moderate Assistance - Patient 50-74% Right Sidelying to Sit: Moderate Assistance - Patient 50-74% Transfers Sit to Stand: Moderate Assistance - Patient 50-74%;Maximal Assistance - Patient 25-49% Stand to Sit: Moderate Assistance - Patient 50-74%;Maximal Assistance - Patient 25-49%  Skilled Intervention:  Pt semi reclined in bed, no c/o pain, pleasant and eager to begin therapy.  Daughter present for most of session.  Initial evaluation completed and collaborated with pt and dtr regarding OT POC.  Pt completed functional mobility and self care per above levels of assist.  Pt provided with appropriate sized w/c and ROHO cushion.  Direct hand off to PT at end of session.   Discharge Criteria: Patient will be discharged from OT if patient refuses treatment 3 consecutive times without medical reason, if treatment goals not met, if there is a change in medical status, if patient makes no progress towards goals or if patient is discharged from hospital.  The above assessment, treatment plan, treatment alternatives and goals were discussed and mutually agreed upon: by patient and by family  Ezekiel Slocumb 12/22/2020, 1:31 PM

## 2020-12-23 NOTE — Progress Notes (Signed)
Speech Language Pathology Daily Session Note  Patient Details  Name: Joel Peck MRN: [id number] Date of Birth: [birth year]  Today's Date: 12/23/2020 SLP Individual Time: 1355-1435 SLP Individual Time Calculation (min): 40 min  Short Term Goals: Week 1: SLP Short Term Goal 1 (Week 1): Patient will complete complex problem solving tasks with sup A verbal cues SLP Short Term Goal 2 (Week 1): Patient will demonstrate anticipatory awareness by identifying up to 3 activities/tasks he can safely participate in at home with sup A verbal cues SLP Short Term Goal 3 (Week 1): Patient will complete simulated medication/financial management tasks with sup A verbal cues to achieve 90% accuracy  Skilled Therapeutic Interventions: Skilled treatment session focused on cognitive goals. SLP facilitated session by providing a list of his current medications. Patient independently recalled the functions of his medications but required supervision level verbal cues for mental flexibility regarding change in medication frequency. Patient organized a BID pill box with overall supervision level verbal cues for problem solving regarding performing tasks with only his RUE (using a placemat to reduce sliding, easy open tabs, pop top pill bottles, etc). Patient left upright in wheelchair with alarm on and all needs within reach. Continue with current plan of care.      Pain No/Denies Pain   Therapy/Group: Individual Therapy  Anahis Furgeson 12/23/2020, 3:35 PM

## 2020-12-23 NOTE — Progress Notes (Signed)
Occupational Therapy Session Note  Patient Details  Name: Joel Peck MRN: [id number] Date of Birth: 22-Jul-1935  Today's Date: 12/23/2020 OT Individual Time: 1300-1340 OT Individual Time Calculation (min): 40 min    Short Term Goals: Week 1:  OT Short Term Goal 1 (Week 1): Pt will demonstrate static standing balance with min assist for self care prep. OT Short Term Goal 2 (Week 1): Pt will use LUE as stabilizer with mod multimodal cues. OT Short Term Goal 3 (Week 1): Pt will donn shirt with min assist and min multimodal cues. OT Short Term Goal 4 (Week 1): Pt will complete toilet transfer with min assist.  Skilled Therapeutic Interventions/Progress Updates:    Pt resting in w/c upon arrival and agreeable to therapy. OT intervention with focus on LUE function and attention to Lt with visual scanning/strategies. Pt with trace grasp and developing shoulder flexion/extension in addition to horizontal abduction/adduction. Pt engaged in table activities for LUE tasks. Pt also engaged in colored peg board tasks with focus on scanning to Lt to pick up pegs and place in peg board. Pt replicated 2 patterns without error. Pt returned to room and remained in w/c with belt alarm activated and all needs within reach.   Therapy Documentation Precautions:  Precautions Precautions: Fall Precaution Comments: L inattention/visual field cut, L hemi Restrictions Weight Bearing Restrictions: No   Pain:  Pt denies pain this afternoon  Therapy/Group: Individual Therapy  Leroy Libman 12/23/2020, 1:43 PM

## 2020-12-23 NOTE — Progress Notes (Signed)
Physical Therapy Session Note  Patient Details  Name: Joel Peck MRN: [id number] Date of Birth: 09-21-1935  Today's Date: 12/23/2020 PT Individual Time: 0900-0945 PT Individual Time Calculation (min): 45 min   Short Term Goals: Week 1:  PT Short Term Goal 1 (Week 1): Pt will complete bed mobility with minA PT Short Term Goal 2 (Week 1): Pt will complete bed<>chair transfers with modA PT Short Term Goal 3 (Week 1): Pt will ambulate 38ft with modA and LRAD PT Short Term Goal 4 (Week 1): Pt will demonstrate improved L sided awareness  Skilled Therapeutic Interventions/Progress Updates:     Pt presents sitting in w/c, agreeable to PT tx. No reports of pain. In hospital gown, requesting assistance getting dressed for the day. Doffed gown with totalA for time. Required maxA for LB dressing and modA for UB dressing - max instructional cues for hemi-technique and required tottalA for pulling pants up in standing with modA for balance as well. Pt's family members brought x3 pairs of new shoes to trial. Informed pt he may benefit from a size up (9) to allow room for possible AFO in near future.   Pt then transported to main rehab gym into // bars. Worked on standing posture, pre-gait training, and gait training within bars. Requires modA to rise in bars while pushing up from w/c with cues needed for forward weight shift. Completed forward/backward stepping 2x10 reps for each foot with min/modA for balance. Used large red visual target to assist with facilitating large steps. Able to progress to ambulation where he ambulated forward/backward length of bars with min/modA with max instructional cues. With gait, required knee blocking on L and cues for guiding LUE along // bar. He's able to initiate LLE swing phase with only min assist.   Pt returned to room at end of session, remained seated in w/c with chair alarm on, all needs within reach.   Therapy Documentation Precautions:   Precautions Precautions: Fall Precaution Comments: L inattention/visual field cut, L hemi Restrictions Weight Bearing Restrictions: No General:    Therapy/Group: Individual Therapy  Alger Simons 12/23/2020, 7:28 AM

## 2020-12-23 NOTE — Progress Notes (Signed)
Occupational Therapy Session Note  Patient Details  Name: Joel Peck MRN: [id number] Date of Birth: November 17, 1935  Today's Date: 12/23/2020 OT Individual Time: 1135-1230 OT Individual Time Calculation (min): 55 min    Short Term Goals: Week 1:  OT Short Term Goal 1 (Week 1): Pt will demonstrate static standing balance with min assist for self care prep. OT Short Term Goal 2 (Week 1): Pt will use LUE as stabilizer with mod multimodal cues. OT Short Term Goal 3 (Week 1): Pt will donn shirt with min assist and min multimodal cues. OT Short Term Goal 4 (Week 1): Pt will complete toilet transfer with min assist.  Skilled Therapeutic Interventions/Progress Updates:    Pt sitting up in w/c, no c/o pain, requesting to brush teeth when asked if he wanted to complete any self care.  Pt brushed teeth with hand over hand to left to grasp toothpaste and external focus facilitated using multimodal cues to actively reach LUE onto sink.  Oral hygiene completed with overall min assist.  Transported to gym noting pts head postured in right rotation and left tilt.  Pt participated in left visual scanning and head turns as well as seated balance righting to midline task at BITS using RUE to reach and tap target in various directions.  Pt needing min to mod assist to right to midline with frequent cues to increase awareness and self correct.  Manual pressure provided through LUE during left leaning to facilitate increased proprioceptive input.  Pt completed LUE AAROM at UBE x 6 minutes with hand ace wrapped for grasp support.  Stand pivot to EOM with mod assist and mirror placed in front to provide visual feedback.  Pt able to self correct midline righting with less cues by therapist with mirror present.  LUE neuro re-ed completed using ball on left side placed under forearm to promote external focus ("push and pull"). Stand pivot to w/c with mod assist.   During transport back to room via w/c, pt cued to count all  doors on left side.  Pt was able to scan to left and identify 7/8 doors with only initial cue provided.  Call bell in reach, left arm trough supporting LUE, seat alarm on at end of session.  Therapy Documentation Precautions:  Precautions Precautions: Fall Precaution Comments: L inattention/visual field cut, L hemi Restrictions Weight Bearing Restrictions: No    Therapy/Group: Individual Therapy  Joel Peck 12/23/2020, 1:14 PM

## 2020-12-24 NOTE — Progress Notes (Signed)
Occupational Therapy Session Note  Patient Details  Name: Joel Peck MRN: [id number] Date of Birth: December 03, 1935  Today's Date: 12/24/2020 OT Individual Time: 1101-1200 OT Individual Time Calculation (min): 59 min    Short Term Goals: Week 1:  OT Short Term Goal 1 (Week 1): Pt will demonstrate static standing balance with min assist for self care prep. OT Short Term Goal 2 (Week 1): Pt will use LUE as stabilizer with mod multimodal cues. OT Short Term Goal 3 (Week 1): Pt will donn shirt with min assist and min multimodal cues. OT Short Term Goal 4 (Week 1): Pt will complete toilet transfer with min assist.  Skilled Therapeutic Interventions/Progress Updates:  Pt greeted seated in w/c  agreeable to OT intervention. Session focus on LUE AAROM and NMR. Pt declined need for ADLs and transported to gym with total A. Pt completed AAROM scapular protraction/retraction with LUE positioned on angled stool with pt instructed to push stool forwards and backwards. Pt seemed to mostly compensate with core but presents with 2-/5 scapular protraction/retraction. Pt additionally completed horizontal shoulder ABD/ADD with hand over hand assist on ball with pt instructed to roll ball R/L, 2-/5 muscle activation noted. Pt utilized unweighted dowel with RUE to work on L shoulder internal/external rotation x20 reps. Issued pt compliant cube to work on functional grasp and release with L hand 2-/5. Pt worked on bilateral integration task of passing cube from R hand to L hand with good carryover and muscle activation wit gravity eliminated. Pt transported back to room with total A where pt had visitors present.  pt left  up in w/c with alarm belt activated and all needs within reach.                   Therapy Documentation Precautions:  Precautions Precautions: Fall Precaution Comments: L inattention/visual field cut, L hemi Restrictions Weight Bearing Restrictions: No  Pain: no pain reported during session       Therapy/Group: Individual Therapy  [name] 12/24/2020, 12:21 PM

## 2020-12-24 NOTE — Progress Notes (Signed)
Physical Therapy Session Note  Patient Details  Name: Joel Peck MRN: [id number] Date of Birth: 1935-12-16  Today's Date: 12/24/2020 PT Individual Time: 0915-1010 PT Individual Time Calculation (min): 55 min   Short Term Goals: Week 1:  PT Short Term Goal 1 (Week 1): Pt will complete bed mobility with minA PT Short Term Goal 2 (Week 1): Pt will complete bed<>chair transfers with modA PT Short Term Goal 3 (Week 1): Pt will ambulate 15ft with modA and LRAD PT Short Term Goal 4 (Week 1): Pt will demonstrate improved L sided awareness  Skilled Therapeutic Interventions/Progress Updates:     Pt seated in w/c to start session. Agreeable to PT tx. Denies pain. Assisted in dressing as pt in hospital gown. Required totalA for LB dressing and minA for UB dressing. Donned socks/shoes with totalA for time. Pt asking for assistance with shaving - relayed to OT as this is his next upcoming session. Transported to main rehab gym in w/c. Assisted to Nustep via stand<>pivot transfer with modA overall without AD. On Nustep with workload to 5, using BLE's only, completed 10 minutes total. Required belt around distal femurs to prevent "frog leg" on weaker LLE. Assisted back to w/c via stand<>pivot transfer with modA. Focused remainder of session on functional gait training. With RW, he ambulated ~65ft + ~76ft with a heavy modA and use of RW. He was able to initiate LLE for swing, requiring soft knee blocking to prevent buckling. Heavy modA for gait due to L truncal lean and for RW management. Pt returned to room at end of session, remained seated in w/c with chair alarm on and all needs within reach. Informed of upcoming therapy schedule.   Therapy Documentation Precautions:  Precautions Precautions: Fall Precaution Comments: L inattention/visual field cut, L hemi Restrictions Weight Bearing Restrictions: No General:    Therapy/Group: Individual Therapy  Alger Simons 12/24/2020, 7:31 AM

## 2020-12-24 NOTE — Progress Notes (Signed)
Speech Language Pathology Daily Session Note  Patient Details  Name: Joel Peck MRN: [id number] Date of Birth: [birth year]  Today's Date: 12/24/2020 SLP Individual Time: 0511-0211 SLP Individual Time Calculation (min): 40 min  Short Term Goals: Week 1: SLP Short Term Goal 1 (Week 1): Patient will complete complex problem solving tasks with sup A verbal cues SLP Short Term Goal 2 (Week 1): Patient will demonstrate anticipatory awareness by identifying up to 3 activities/tasks he can safely participate in at home with sup A verbal cues SLP Short Term Goal 3 (Week 1): Patient will complete simulated medication/financial management tasks with sup A verbal cues to achieve 90% accuracy  Skilled Therapeutic Interventions:   Patient seen for skilled ST session focused on cognitive goals. Patient sitting up in [facility] with son present in room. SLP introduced cognitive task of mildly complex cognitive task of logic/reasoning. He initially required min-modA cues to demonstrate understanding of task demands as well as awareness to errors, however this improved to minA overall. Patient did report that he enjoyed the activity and that he felt it was helpful for working on his cognition. Patient left in [facility] with all needs in front of him. He continues to benefit from skilled SLP intervention to maximize cognitive functioning before discharge.  Pain Pain Assessment Pain Scale: 0-10 Pain Score: 0-No pain  Therapy/Group: Individual Therapy  Sonia Baller, MA, CCC-SLP Speech Therapy

## 2020-12-25 DIAGNOSIS — K5901 Slow transit constipation: Secondary | ICD-10-CM

## 2020-12-25 DIAGNOSIS — R1310 Dysphagia, unspecified: Secondary | ICD-10-CM

## 2020-12-25 DIAGNOSIS — G479 Sleep disorder, unspecified: Secondary | ICD-10-CM

## 2020-12-25 DIAGNOSIS — I10 Essential (primary) hypertension: Secondary | ICD-10-CM

## 2020-12-25 MED ORDER — TRAZODONE HCL 50 MG PO TABS
50.0000 mg | ORAL_TABLET | Freq: Every day | ORAL | Status: DC
  Administered 2020-12-25: 21:00:00 50 mg via ORAL
  Filled 2020-12-25: qty 1

## 2020-12-25 MED ORDER — SORBITOL 70 % SOLN: 30.0000 mL | Freq: Every day | Status: DC | PRN

## 2020-12-25 NOTE — Progress Notes (Signed)
Occupational Therapy Session Note  Patient Details  Name: Joel Peck MRN: [id number] Date of Birth: 03/02/1935  Today's Date: 12/25/2020 OT Individual Time: 9678-9381 OT Individual Time Calculation (min): 53 min    Short Term Goals: Week 1:  OT Short Term Goal 1 (Week 1): Pt will demonstrate static standing balance with min assist for self care prep. OT Short Term Goal 2 (Week 1): Pt will use LUE as stabilizer with mod multimodal cues. OT Short Term Goal 3 (Week 1): Pt will donn shirt with min assist and min multimodal cues. OT Short Term Goal 4 (Week 1): Pt will complete toilet transfer with min assist.  Skilled Therapeutic Interventions/Progress Updates:    Pt received seated in w/c, no c/o pain and declines need for ADL, agreeable to therapy. Session focus on LUE NMR, activity tolerance, L visual scanning in prep for improved ADL/IADL/func mobility performance + decreased caregiver burden. Total A w/c transport to and from gym for time management and energy conservation.  Seated at table, completed 2x15 of the following: forward towel slides against table, against incline with use of RUE. In stance, completed 2x15: mini push-ups, forward towel slides, and towel circles.   Therapist applied Saebo Stim One NMES to the L digit/wrist flexors at the below settings:     Saebo Stim One Intensity: 15 clicks Duration: 10 min 330 pulse width 35 Hz pulse rate On 8 sec/ off 8 sec Ramp up/ down 2 sec Symmetrical Biphasic wave form Max intensity 147m at 500 Ohm load  Pt denies pain and skin in intact. Required overall min A to facilitate functional grasp/release on cup , pt able to simulate drinking from cup and bring to mouth with S. Finally, issued soft tan theraputty and pt able to roll into log shape with increased time and able to put into cup himself with only min A.  Finally, pt able to complete 3 lines of MValene Borsletter cross out worksheet. Able to identify all targets on L  side with independence, although noted preference to keep page in R visual field.  Pt left seated in w/c with chair pad alarm engaged, call bell in reach, and all immediate needs met.    Therapy Documentation Precautions:  Precautions Precautions: Fall Precaution Comments: L inattention/visual field cut, L hemi Restrictions Weight Bearing Restrictions: No  Pain:  No c/o ADL: See Care Tool for more details.   Therapy/Group: Individual Therapy  RVolanda NapoleonMS, OTR/L  12/25/2020, 6:57 AM

## 2020-12-25 NOTE — Progress Notes (Signed)
Physical Therapy Session Note  Patient Details  Name: Joel Peck MRN: [id number] Date of Birth: 16-Mar-1935  Today's Date: 12/25/2020 PT Individual Time: 6546-5035 PT Individual Time Calculation (min): 70 min   Short Term Goals: Week 1:  PT Short Term Goal 1 (Week 1): Pt will complete bed mobility with minA PT Short Term Goal 2 (Week 1): Pt will complete bed<>chair transfers with modA PT Short Term Goal 3 (Week 1): Pt will ambulate 15ft with modA and LRAD PT Short Term Goal 4 (Week 1): Pt will demonstrate improved L sided awareness  Skilled Therapeutic Interventions/Progress Updates:     Pt presents sitting in w/c, working on LUE strengthening exercises. Pt agreeable to PT tx. No reports of pain. Pt requesting assistance with sponge bath and getting dressed for the day. Wheeled sinkside and pt completed UB bathing with minA (for L underarm and back) and LB bathing with modA (for back of legs and bottom). Required modA for standing at the sink to complete LB bathing. UB dressing completed with minA via hemi technique and maxA for LB dressing. Donned socks/shoes with totalA for time. Transported to day room rehab gym and worked on dynamic standing and functional gait training. Played "putt-putt" golf (pt very big golf enthusiast, golfed 3x/wk PTA), where he required min/modA for dynamic standing balance while putting - also able to take some side steps to "align himself" for golf stroke where he required modA for balance. Pt then instructed in gait where he ambulated ~52ft (including 180deg turns) with modA and RW. Primariy gait deficits include narrow BOS, adduction of LLE, excessive hip flexion in swing with "steppage" pattern, L truncal lean, and assist needed for RW management. Pt returned to room at completion of session. Remained seated in w/c with chair alarm on and all needs within reach.   Therapy Documentation Precautions:  Precautions Precautions: Fall Precaution Comments: L  inattention/visual field cut, L hemi Restrictions Weight Bearing Restrictions: No General:    Therapy/Group: Individual Therapy  Eastin Swing P Larenz Frasier 12/25/2020, 7:11 AM

## 2020-12-25 NOTE — Progress Notes (Signed)
Speech Language Pathology Daily Session Note  Patient Details  Name: Joel Peck MRN: [id number] Date of Birth: 09-02-1935  Today's Date: 12/25/2020 SLP Individual Time: 0800-0900 SLP Individual Time Calculation (min): 60 min  Short Term Goals: Week 1: SLP Short Term Goal 1 (Week 1): Patient will complete complex problem solving tasks with sup A verbal cues SLP Short Term Goal 2 (Week 1): Patient will demonstrate anticipatory awareness by identifying up to 3 activities/tasks he can safely participate in at home with sup A verbal cues SLP Short Term Goal 3 (Week 1): Patient will complete simulated medication/financial management tasks with sup A verbal cues to achieve 90% accuracy  Skilled Therapeutic Interventions: Pt seen for skilled ST with focus on cognitive goals, pt sitting up in wheelchair with daughter exiting room. Pt able to recall events of previous therapy sessions (ST, PT and OT) accurately. MD in and out for rounds. Pt provided current med list, able to ID differing/new medications and once educated on dosage and reason for taking, pt able to recall with 100% accuracy throughout tx session. Pt agreesthat current medication regime less to manage than previous "6 in the morning and 6 at dinner". Pt completing word search puzzle per request with Supervision cues. Pt demonstrates adequate sustained attention/scanning. Pt left in wheelchair with alarm belt activated and all needs within reach. Cont ST POC.   Pain Pain Assessment Pain Scale: 0-10 Pain Score: 0-No pain  Therapy/Group: Individual Therapy  [name] 12/25/2020, 9:38 AM

## 2020-12-25 NOTE — Progress Notes (Signed)
PROGRESS NOTE   Subjective/Complaints: Pt haing occasional cough. Also struggling to sleep, feels anxious at night. +constipation  ROS: Patient denies fever, rash, sore throat, blurred vision, nausea, vomiting, diarrhea, cough, shortness of breath or chest pain, joint or back pain, headache.    Objective:   No results found. No results for input(s): WBC, HGB, HCT, PLT in the last 72 hours. No results for input(s): NA, K, CL, CO2, GLUCOSE, BUN, CREATININE, CALCIUM in the last 72 hours.  Intake/Output Summary (Last 24 hours) at 12/25/2020 1027 Last data filed at 12/25/2020 0907 Gross per 24 hour  Intake 350 ml  Output 950 ml  Net -600 ml        Physical Exam: Vital Signs Blood pressure (!) 160/62, pulse 86, temperature 98.4 F (36.9 C), resp. rate 14, height 5\' 7"  (1.702 m), weight 77.1 kg, SpO2 97 %.  General: Alert and oriented x 3, No apparent distress HEENT: Head is normocephalic, atraumatic, PERRLA, EOMI, sclera anicteric, oral mucosa pink and moist, dentition intact, ext ear canals clear,  Neck: Supple without JVD or lymphadenopathy Heart: Reg rate and rhythm. No murmurs rubs or gallops Chest: crackles at right base Abdomen: Soft, non-tender, non-distended, bowel sounds positive. Extremities: No clubbing, cyanosis, or edema. Pulses are 2+ Psych: Pt's affect is appropriate. Pt is cooperative Skin: Clean and intact without signs of breakdown Neuro: oriented and alert. Reasonable insight and awareness. No CN signs. LUE 0/5 prox to 2/5 wrist,HI. LLE 3- HE, KE and 0/5 at ankle. Senses pain and light touch.  Musculoskeletal: normal rom. No jt swelling    Assessment/Plan: 1. Functional deficits which require 3+ hours per day of interdisciplinary therapy in a comprehensive inpatient rehab setting. Physiatrist is providing close team supervision and 24 hour management of active medical problems listed below. Physiatrist  and rehab team continue to assess barriers to discharge/monitor patient progress toward functional and medical goals  Care Tool:  Bathing              Bathing assist Assist Level: Moderate Assistance - Patient 50 - 74%     Upper Body Dressing/Undressing Upper body dressing Upper body dressing/undressing activity did not occur (including orthotics): Safety/medical concerns What is the patient wearing?: Hospital gown only    Upper body assist Assist Level: Moderate Assistance - Patient 50 - 74%    Lower Body Dressing/Undressing Lower body dressing    Lower body dressing activity did not occur: Safety/medical concerns What is the patient wearing?: Pants, Underwear/pull up     Lower body assist Assist for lower body dressing: Maximal Assistance - Patient 25 - 49%     Toileting Toileting    Toileting assist Assist for toileting: 2 Helpers     Transfers Chair/bed transfer  Transfers assist     Chair/bed transfer assist level: Maximal Assistance - Patient 25 - 49%     Locomotion Ambulation   Ambulation assist      Assist level: Maximal Assistance - Patient 25 - 49% Assistive device: Parallel bars Max distance: 7ft   Walk 10 feet activity   Assist  Walk 10 feet activity did not occur: Safety/medical concerns  Walk 50 feet activity   Assist Walk 50 feet with 2 turns activity did not occur: Safety/medical concerns         Walk 150 feet activity   Assist Walk 150 feet activity did not occur: Safety/medical concerns         Walk 10 feet on uneven surface  activity   Assist Walk 10 feet on uneven surfaces activity did not occur: Safety/medical concerns         Wheelchair     Assist Is the patient using a wheelchair?: Yes Type of Wheelchair: Manual    Wheelchair assist level: Dependent - Patient 0% Max wheelchair distance: 139ft    Wheelchair 50 feet with 2 turns activity    Assist        Assist Level:  Dependent - Patient 0%   Wheelchair 150 feet activity     Assist      Assist Level: Dependent - Patient 0%   Blood pressure (!) 160/62, pulse 86, temperature 98.4 F (36.9 C), resp. rate 14, height 5\' 7"  (1.702 m), weight 77.1 kg, SpO2 97 %.  Medical Problem List and Plan: 1. 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  Antithrombotics: -DVT/anticoagulation:  Pharmaceutical: Lovenox             -antiplatelet therapy: DAPT X 3 months followed by ASA alone.  3. Pain Management: Tylenol or oxycodone depending on severity of pain.  4. Mood/sleep: LCSW to follow for evaluation and support.              -antipsychotic agents: N/A  12/24 -seems to be having anxiety at night/insomnia being in a different place and alone   -will schedule trazodone 50mg  qhs   -monitor 5. Neuropsych: This patient is capable of making decisions on his own behalf. 6. Skin/Wound Care: Routine pressure relief measures.              --monitor right neck incision for healing/edema.  7. Fluids/Electrolytes/Nutrition: Monitor I/O. Check lytes in am.  8. HTN: Monitor BP TID with SBP goals 110-120 to avoid hypoperfusion.              --On Norvasc and metoprolol BID.        Vitals:    12/21/20 1959 12/22/20 0456  BP: (!) 140/59 128/61  Pulse: 68 66  Resp: 18 18  Temp: 98.4 F (36.9 C) 98.4 F (36.9 C)  SpO2: 98% 97%  Controlled 12/24   9. AKI: SCr has risen from 1.03-->1.30. Likely due to dye/hypotension             --Encourage fluid intake. 12/21 recheck SCr improved to 0.9  -recheck Monday  10. Leucocytosis: Likely reactive due to surgery. Will monitor for signs of infection 11. ABLA: Likely due to thrombolytics/surgery with drop in  Hgb 14.3--->10.9---> 11.3 on 12/21 --Will  monitor with serial CBC for trend as on DAPT/Lovenox  12. Hyperlipidemia: LDL 111. Continue Crestor.  13. BPH: Continue Flomax. Will check PVRs as reporting hesitancy.  14. Post CEA odynophagia/dysphagia: He is ordering appropriate foods. Will monitor for now.  --he does not want diet adjusted at this time 15. Insomnia:  Will increase melatonin to 5 mg. Family to spend night for support.  16. Chronic constipation-     -had large bm 12/22  -continue current mom and colace in pm 17. GERD- will increase Protonix- used Omeprazole at home.   18.  Odynophagia related to CEA lung exam c/w with possible aspiration on RIght--pt with crackles in right base however cxr does not demonstrate disease  -encourage IS, FV    LOS: 4 days A FACE TO FACE EVALUATION WAS PERFORMED  Meredith Staggers 12/25/2020, 10:27 AM

## 2020-12-26 MED ORDER — TRAZODONE HCL 50 MG PO TABS
75.0000 mg | ORAL_TABLET | Freq: Every day | ORAL | Status: DC
  Administered 2020-12-26 – 2021-01-05 (×11): 75 mg via ORAL
  Filled 2020-12-26 (×11): qty 2

## 2020-12-26 NOTE — Progress Notes (Signed)
PROGRESS NOTE   Subjective/Complaints: Robitussin helped cough. Slept better but still didn't sleep thru night  ROS: Patient denies fever, rash, sore throat, blurred vision, nausea, vomiting, diarrhea,  shortness of breath or chest pain, joint or back pain, headache, or mood change. .    Objective:   No results found. No results for input(s): WBC, HGB, HCT, PLT in the last 72 hours. No results for input(s): NA, K, CL, CO2, GLUCOSE, BUN, CREATININE, CALCIUM in the last 72 hours.  Intake/Output Summary (Last 24 hours) at 12/26/2020 0811 Last data filed at 12/26/2020 0700 Gross per 24 hour  Intake 480 ml  Output 450 ml  Net 30 ml        Physical Exam: Vital Signs Blood pressure 132/62, pulse 78, temperature 98.2 F (36.8 C), resp. rate 16, height 5\' 7"  (1.702 m), weight 77.1 kg, SpO2 93 %.  Constitutional: No distress . Vital signs reviewed. HEENT: NCAT, EOMI, oral membranes moist Neck: supple Cardiovascular: RRR without murmur. No JVD    Respiratory/Chest: a few crackles at right base. Normal effort    GI/Abdomen: BS +, non-tender, non-distended Ext: no clubbing, cyanosis, or edema Psych: pleasant and cooperative  Skin: Clean and intact without signs of breakdown Neuro: oriented and alert. Reasonable insight and awareness. No CN signs. LUE 0/5 prox to 2/5 wrist,HI. LLE 3- HE, KE and 0/5 at ankle. Senses pain and light touch.  Musculoskeletal: normal rom. No jt swelling    Assessment/Plan: 1. Functional deficits which require 3+ hours per day of interdisciplinary therapy in a comprehensive inpatient rehab setting. Physiatrist is providing close team supervision and 24 hour management of active medical problems listed below. Physiatrist and rehab team continue to assess barriers to discharge/monitor patient progress toward functional and medical goals  Care Tool:  Bathing              Bathing assist Assist  Level: Moderate Assistance - Patient 50 - 74%     Upper Body Dressing/Undressing Upper body dressing Upper body dressing/undressing activity did not occur (including orthotics): Safety/medical concerns What is the patient wearing?: Hospital gown only    Upper body assist Assist Level: Moderate Assistance - Patient 50 - 74%    Lower Body Dressing/Undressing Lower body dressing    Lower body dressing activity did not occur: Safety/medical concerns What is the patient wearing?: Pants, Underwear/pull up     Lower body assist Assist for lower body dressing: Maximal Assistance - Patient 25 - 49%     Toileting Toileting    Toileting assist Assist for toileting: 2 Helpers     Transfers Chair/bed transfer  Transfers assist     Chair/bed transfer assist level: Moderate Assistance - Patient 50 - 74%     Locomotion Ambulation   Ambulation assist      Assist level: Moderate Assistance - Patient 50 - 74% Assistive device: Walker-rolling Max distance: 57ft   Walk 10 feet activity   Assist  Walk 10 feet activity did not occur: Safety/medical concerns  Assist level: Moderate Assistance - Patient - 50 - 74% Assistive device: Walker-rolling   Walk 50 feet activity   Assist Walk 50 feet with 2 turns activity  did not occur: Safety/medical concerns  Assist level: Moderate Assistance - Patient - 50 - 74% Assistive device: Walker-rolling    Walk 150 feet activity   Assist Walk 150 feet activity did not occur: Safety/medical concerns         Walk 10 feet on uneven surface  activity   Assist Walk 10 feet on uneven surfaces activity did not occur: Safety/medical concerns         Wheelchair     Assist Is the patient using a wheelchair?: Yes Type of Wheelchair: Manual    Wheelchair assist level: Dependent - Patient 0% Max wheelchair distance: 137ft    Wheelchair 50 feet with 2 turns activity    Assist        Assist Level: Dependent -  Patient 0%   Wheelchair 150 feet activity     Assist      Assist Level: Dependent - Patient 0%   Blood pressure 132/62, pulse 78, temperature 98.2 F (36.8 C), resp. rate 16, height 5\' 7"  (1.702 m), weight 77.1 kg, SpO2 93 %.  Medical Problem List and Plan: 1. 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  Antithrombotics: -DVT/anticoagulation:  Pharmaceutical: Lovenox             -antiplatelet therapy: DAPT X 3 months followed by ASA alone.  3. Pain Management: Tylenol or oxycodone depending on severity of pain.  4. Mood/sleep:               -antipsychotic agents: N/A  12/25 -seems to be having anxiety at night/insomnia being in a different place and alone   -will increase trazodone sl to 75mg  qhs   -continue to provide egosupport and positive reinforcement 5. Neuropsych: This patient is capable of making decisions on his own behalf. 6. Skin/Wound Care: Routine pressure relief measures.              --monitor right neck incision for healing/edema.  7. Fluids/Electrolytes/Nutrition: Monitor I/O. Check lytes in am.  8. HTN: Monitor BP TID with SBP goals 110-120 to avoid hypoperfusion.              --On Norvasc and metoprolol BID.        Vitals:    12/21/20 1959 12/22/20 0456  BP: (!) 140/59 128/61  Pulse: 68 66  Resp: 18 18  Temp: 98.4 F (36.9 C) 98.4 F (36.9 C)  SpO2: 98% 97%  Controlled 12/25   9. AKI: SCr has risen from 1.03-->1.30. Likely due to dye/hypotension             --Encourage fluid intake. 12/21 recheck SCr improved to 0.9  -recheck Monday  10. Leucocytosis: Likely reactive due to surgery. Will monitor for signs of infection 11. ABLA: Likely due to thrombolytics/surgery with drop in  Hgb 14.3--->10.9---> 11.3 on 12/21 --Will  monitor with serial CBC for trend as on DAPT/Lovenox  12. Hyperlipidemia: LDL 111. Continue Crestor.  13. BPH: Continue Flomax. Will check PVRs as reporting hesitancy.  14. Post CEA odynophagia/dysphagia: He is ordering appropriate foods. Will monitor for now.  --he does not want diet adjusted at this time 15. Insomnia: Will increase melatonin  to 5 mg. Family to spend night for support.  16. Chronic constipation-     -had large bm 12/22  -continue current mom and colace in pm 17. GERD- will increase Protonix- used Omeprazole at home.   18.  Odynophagia related to CEA -some crackles at right base on exam. CXR clear  -encourage IS, FV    LOS: 5 days A FACE TO FACE EVALUATION WAS PERFORMED  Meredith Staggers 12/26/2020, 8:11 AM

## 2020-12-27 LAB — BASIC METABOLIC PANEL
Anion gap: 10 (ref 5–15)
BUN: 10 mg/dL (ref 8–23)
CO2: 26 mmol/L (ref 22–32)
Calcium: 9.1 mg/dL (ref 8.9–10.3)
Chloride: 102 mmol/L (ref 98–111)
Creatinine, Ser: 1 mg/dL (ref 0.61–1.24)
GFR, Estimated: >60 mL/min (ref >60)
Glucose, Bld: 108 mg/dL — ABNORMAL HIGH (ref 70–99)
Potassium: 4.2 mmol/L (ref 3.5–5.1)
Sodium: 138 mmol/L (ref 135–145)

## 2020-12-27 LAB — CBC
HCT: 38 % — ABNORMAL LOW (ref 39.0–52.0)
Hemoglobin: 12.7 g/dL — ABNORMAL LOW (ref 13.0–17.0)
MCH: 27.3 pg (ref 26.0–34.0)
MCHC: 33.4 g/dL (ref 30.0–36.0)
MCV: 81.7 fL (ref 80.0–100.0)
Platelets: 342 10*3/uL (ref 150–400)
RBC: 4.65 MIL/uL (ref 4.22–5.81)
RDW: 13.2 % (ref 11.5–15.5)
WBC: 13.2 10*3/uL — ABNORMAL HIGH (ref 4.0–10.5)
nRBC: 0 % (ref 0.0–0.2)

## 2020-12-27 LAB — GLUCOSE, CAPILLARY
Glucose-Capillary: 99 mg/dL (ref 70–99)
Glucose-Capillary: 117 mg/dL — ABNORMAL HIGH (ref 70–99)
Glucose-Capillary: 124 mg/dL — ABNORMAL HIGH (ref 70–99)

## 2020-12-27 MED ORDER — OXYCODONE-ACETAMINOPHEN 5-325 MG PO TABS: 1.0000 | ORAL_TABLET | ORAL | Status: DC | PRN

## 2020-12-27 NOTE — Progress Notes (Signed)
Occupational Therapy Session Note  Patient Details  Name: Joel Peck MRN: [id number] Date of Birth: August 08, 1935  Today's Date: 12/27/2020 OT Individual Time: 1100-1155 OT Individual Time Calculation (min): 55 min    Short Term Goals: Week 1:  OT Short Term Goal 1 (Week 1): Pt will demonstrate static standing balance with min assist for self care prep. OT Short Term Goal 2 (Week 1): Pt will use LUE as stabilizer with mod multimodal cues. OT Short Term Goal 3 (Week 1): Pt will donn shirt with min assist and min multimodal cues. OT Short Term Goal 4 (Week 1): Pt will complete toilet transfer with min assist.  Skilled Therapeutic Interventions/Progress Updates:    Pt received seated in w/c with family present, no c/o pain, agreeable to therapy. Session focus on activity tolerance, bimanual integration,  LUE NMR in prep for improved ADL/IADL/func mobility performance + decreased caregiver burden. Declines need for ADL this date. Total A w/c transport to and from gyms throughout session.   Seated in w/c, completed massed practice of tossing bean bag onto cornhole board with BUE to target bimanual integration. Able to toss majority of bags onto board.  Seated at [facility] with L ace wrap and use of screen stylus, pt able to complete the following games with LUE:  -Single Target: 85.71% accuracy , 3.25 reaction time, 36 hits; min to mod A from therapist overall to support LUE   -Single Target: 82.86% accuracy, 4.06 reaction time, 29 hits; able to complete this round without therapist assist  -Tracing Shapes: total A physical assist to trace large shapes on screen  Finally, Therapist applied Saebo Stim One NMES to L wrist/digit extensors at the below settings:     Saebo Stim One Intensity: 17 clicks Duration: 8 min 330 pulse width 35 Hz pulse rate On 8 sec/ off 8 sec Ramp up/ down 2 sec Symmetrical Biphasic wave form Max intensity 158m at 500 Ohm load  Pt denies pain and skin in intact.  Facilitated functional grasp and release on horse shoes and practiced placing on horse shoe stand with mod physical assist.  Pt left seated in w/c with chair pad alarm engaged, call bell in reach, and all immediate needs met.    Therapy Documentation Precautions:  Precautions Precautions: Fall Precaution Comments: L inattention/visual field cut, L hemi Restrictions Weight Bearing Restrictions: No  Pain: no c/o   ADL: See Care Tool for more details.  Therapy/Group: Individual Therapy  RVolanda NapoleonMS, OTR/L  12/27/2020, 6:46 AM

## 2020-12-27 NOTE — Progress Notes (Signed)
Physical Therapy Session Note  Patient Details  Name: Joel Peck MRN: [id number] Date of Birth: [birth year]  Today's Date: 12/27/2020 PT Individual Time: 0801-0903 PT Individual Time Calculation (min): 62 min   Short Term Goals: Week 1:  PT Short Term Goal 1 (Week 1): Pt will complete bed mobility with minA PT Short Term Goal 2 (Week 1): Pt will complete bed<>chair transfers with modA PT Short Term Goal 3 (Week 1): Pt will ambulate 75ft with modA and LRAD PT Short Term Goal 4 (Week 1): Pt will demonstrate improved L sided awareness  Skilled Therapeutic Interventions/Progress Updates:  Patient seated in w/c on entrance to room. Daughter present. NT preparing to take pt to bathroom in [place]. Patient alert and agreeable to PT session.   Patient with no pain complaint throughout session.  Therapeutic Activity: Transfers: Patient performed sit<>stand in [place] with CGA using pull-to-stand technique. During session, pt performs stand pivot transfers using RW with minA for balance and L knee guard with cues for sequencing and technique. By end of session, fatigue increases and pt requires light modA to complete stand pivot with increased vc for sequencing. Blocked practice of sit<>stands performed for NMR described below.   Gait Training:  Patient ambulated 20 ft using RW with L hand saddle splint with Min increasing to [place] with fatigue. Demonstrated increased L knee flexion throughout but no buckle. Provided vc/ tc throughout for holding "strong knee" with stance to LLE and .  Neuromuscular Re-ed: NMR facilitated during session with focus on sitting/ standing balance, midline orientation, proprioception, muscle activation. Pt guided in seated balance activity to describe lean and provide "fix" to find midline, then perform. Pt then guided into blocked practice with NMR cueing for sit<>stand transfers. Pt demos significant quad activation with transition of weight shift in forward lean. Unable  to produce full extension in L knee but able to hold with no buckle.Improves from MinA initially to near supervision by end.  Pt then able to perform 1x10 minisquats with guard to L knee and CGA. NMR performed for improvements in motor control and coordination, balance, sequencing, judgement, and self confidence/ efficacy in performing all aspects of mobility at highest level of independence.   Patient seated upright in w/c at end of session with brakes locked, seat pad alarm set, and all needs within reach.   Therapy Documentation Precautions:  Precautions Precautions: Fall Precaution Comments: L inattention/visual field cut, L hemi Restrictions Weight Bearing Restrictions: No General:   Vital Signs:  Pain:  No pain complaint this session  Therapy/Group: Individual Therapy  Alger Simons PT, DPT 12/27/2020, 9:29 AM

## 2020-12-27 NOTE — Progress Notes (Signed)
Physical Therapy Session Note  Patient Details  Name: KRISTJAN DERNER MRN: [id number] Date of Birth: 31-Dec-1935  Today's Date: 12/27/2020 PT Individual Time: 9169-4503 PT Individual Time Calculation (min): 86 min   Short Term Goals: Week 1:  PT Short Term Goal 1 (Week 1): Pt will complete bed mobility with minA PT Short Term Goal 2 (Week 1): Pt will complete bed<>chair transfers with modA PT Short Term Goal 3 (Week 1): Pt will ambulate 50ft with modA and LRAD PT Short Term Goal 4 (Week 1): Pt will demonstrate improved L sided awareness  Skilled Therapeutic Interventions/Progress Updates:    Patient received sitting up in his wc, agreeable to PT. He denies pain. PT transporting patient in wc to therapy gym for time management and energy conservation. He transferred to mat via stand pivot with ModA. Blocked practice sit <> stand 2x6 with MinA and approximation through L LE. Mirror used for visual feedback on posture 2nd bout due to tendency toward L lateral lean. L LE biased with R LE on 4" box for sit <> stand 2x6 with noted good extension of L LE. Modified single leg stnace with R LE on 4" box tapping numbers on mirror, progressing to tapping numbers in a sequence on the mirror. With fatigue, progressive L lateral lean. LiteGait as follows:  4' at 0.5 mph for 160ft 4' 0.67mph for 255ft  4' at 0.54mph for 273ft PT facilitating R weight shift, L hip + knee extensors in stance. Patient with L foot steppage for adequate foot clearance. L pelvic bungee on to prevent excessive L pelvic posterior rotation. Patient requiring extended seated rest breaks between bouts. Returning to room in wc, chair alarm on, call light within reach.    Therapy Documentation Precautions:  Precautions Precautions: Fall Precaution Comments: L inattention/visual field cut, L hemi Restrictions Weight Bearing Restrictions: No     Therapy/Group: Individual Therapy  Karoline Caldwell, PT, DPT,  CBIS  12/27/2020, 7:42 AM

## 2020-12-27 NOTE — Progress Notes (Signed)
PROGRESS NOTE   Subjective/Complaints: Still with a tickle in his throat- recommended tea with honey and he is agreeable- placed nursing order Denies pain Right handed  ROS: Patient denies fever, rash, sore throat, blurred vision, nausea, vomiting, diarrhea,  shortness of breath or chest pain, joint or back pain, headache, or mood change. +throat discomfort   Objective:   No results found. Recent Labs    12/27/20 0611  WBC 13.2*  HGB 12.7*  HCT 38.0*  PLT 342   Recent Labs    12/27/20 0611  NA 138  K 4.2  CL 102  CO2 26  GLUCOSE 108*  BUN 10  CREATININE 1.00  CALCIUM 9.1    Intake/Output Summary (Last 24 hours) at 12/27/2020 1430 Last data filed at 12/27/2020 1236 Gross per 24 hour  Intake 480 ml  Output 275 ml  Net 205 ml        Physical Exam: Vital Signs Blood pressure (!) 145/66, pulse 82, temperature 98.3 F (36.8 C), resp. rate 14, height 5\' 7"  (1.702 m), weight 77.1 kg, SpO2 97 %.  Constitutional: No distress . Vital signs reviewed. HEENT: NCAT, EOMI, oral membranes moist Neck: supple Cardiovascular: RRR without murmur. No JVD    Respiratory/Chest: a few crackles at right base. Normal effort    GI/Abdomen: BS +, non-tender, non-distended Ext: no clubbing, cyanosis, or edema Psych: pleasant and cooperative  Skin: Clean and intact without signs of breakdown Neuro: oriented and alert. Reasonable insight and awareness. No CN signs. Left EF and EE 3/5 to 2/5 wrist,HI. LLE 3- HE, KE and 0/5 at ankle. Senses pain and light touch.  Musculoskeletal: normal rom. No jt swelling    Assessment/Plan: 1. Functional deficits which require 3+ hours per day of interdisciplinary therapy in a comprehensive inpatient rehab setting. Physiatrist is providing close team supervision and 24 hour management of active medical problems listed below. Physiatrist and rehab team continue to assess barriers to  discharge/monitor patient progress toward functional and medical goals  Care Tool:  Bathing              Bathing assist Assist Level: Moderate Assistance - Patient 50 - 74%     Upper Body Dressing/Undressing Upper body dressing Upper body dressing/undressing activity did not occur (including orthotics): Safety/medical concerns What is the patient wearing?: Hospital gown only    Upper body assist Assist Level: Moderate Assistance - Patient 50 - 74%    Lower Body Dressing/Undressing Lower body dressing    Lower body dressing activity did not occur: Safety/medical concerns What is the patient wearing?: Pants, Underwear/pull up     Lower body assist Assist for lower body dressing: Maximal Assistance - Patient 25 - 49%     Toileting Toileting    Toileting assist Assist for toileting: 2 Helpers     Transfers Chair/bed transfer  Transfers assist     Chair/bed transfer assist level: Moderate Assistance - Patient 50 - 74%     Locomotion Ambulation   Ambulation assist      Assist level: Moderate Assistance - Patient 50 - 74% Assistive device: Walker-rolling Max distance: 60ft   Walk 10 feet activity   Assist  Walk  10 feet activity did not occur: Safety/medical concerns  Assist level: Moderate Assistance - Patient - 50 - 74% Assistive device: Walker-rolling   Walk 50 feet activity   Assist Walk 50 feet with 2 turns activity did not occur: Safety/medical concerns  Assist level: Moderate Assistance - Patient - 50 - 74% Assistive device: Walker-rolling    Walk 150 feet activity   Assist Walk 150 feet activity did not occur: Safety/medical concerns         Walk 10 feet on uneven surface  activity   Assist Walk 10 feet on uneven surfaces activity did not occur: Safety/medical concerns         Wheelchair     Assist Is the patient using a wheelchair?: Yes Type of Wheelchair: Manual    Wheelchair assist level: Dependent - Patient  0% Max wheelchair distance: 132ft    Wheelchair 50 feet with 2 turns activity    Assist        Assist Level: Dependent - Patient 0%   Wheelchair 150 feet activity     Assist      Assist Level: Dependent - Patient 0%   Blood pressure (!) 145/66, pulse 82, temperature 98.3 F (36.8 C), resp. rate 14, height 5\' 7"  (1.702 m), weight 77.1 kg, SpO2 97 %.  Medical Problem List and Plan: 1. 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  Impaired mobility, ambulating 55 feet: continue Lovenox             -antiplatelet therapy: DAPT X 3 months followed by ASA alone.  3. Pain: Decrease oxycodone to 1 tab q4H prn  4. Mood/sleep:               -antipsychotic agents: N/A  12/25 -seems to be having anxiety at night/insomnia being in a different place and alone   -will increase trazodone sl to 75mg  qhs   -continue to provide egosupport and positive reinforcement 5. Neuropsych: This patient is capable of making decisions on his own behalf. 6. Skin/Wound Care: Routine pressure relief measures.              --monitor right neck incision for healing/edema.  7. Fluids/Electrolytes/Nutrition: Monitor I/O. Check lytes in am.  8. HTN: Monitor BP TID with SBP goals 110-120 to avoid hypoperfusion.              --On Norvasc and metoprolol BID.        Vitals:    12/21/20 1959 12/22/20 0456  BP: (!) 140/59 128/61  Pulse: 68 66  Resp: 18 18  Temp: 98.4 F (36.9 C) 98.4 F (36.9 C)  SpO2: 98% 97%  Controlled 12/25   9. AKI: SCr has risen from 1.03-->1.30. Likely due to dye/hypotension             --Encourage fluid intake. 12/21 recheck SCr improved to 0.9  -recheck Monday  10. Leucocytosis: Likely reactive due to  surgery. Will monitor for signs of infection 11. ABLA: Likely due to thrombolytics/surgery with drop in  Hgb 14.3--->10.9---> 11.3 on 12/21 --Will monitor with serial CBC for trend as on DAPT/Lovenox  12. Hyperlipidemia: LDL 111. Continue Crestor.  13. BPH: Continue Flomax. Will check PVRs as reporting hesitancy.  14. Post CEA odynophagia/dysphagia: He is ordering appropriate foods. Will monitor for now.  --he does not want diet adjusted at this time 15. Insomnia: Will increase melatonin to 5 mg. Family to spend night for support.  16. Chronic constipation-     -had large bm 12/22  -continue current mom and colace in pm 17. GERD- will increase Protonix- used Omeprazole at home.   18.  Odynophagia related to CEA -some crackles at right base on exam. CXR clear  -encourage IS, FV  Placed nursing order to bring him tea with honey daily.     LOS: 6 days A FACE TO FACE EVALUATION WAS PERFORMED  Joel Peck P Burnell Matlin 12/27/2020, 2:30 PM

## 2020-12-27 NOTE — Progress Notes (Signed)
Speech Language Pathology Daily Session Note  Patient Details  Name: Joel Peck MRN: [id number] Date of Birth: 08-01-1935  Today's Date: 12/27/2020 SLP Individual Time: 0930-1015 SLP Individual Time Calculation (min): 45 min  Short Term Goals: Week 1: SLP Short Term Goal 1 (Week 1): Patient will complete complex problem solving tasks with sup A verbal cues SLP Short Term Goal 2 (Week 1): Patient will demonstrate anticipatory awareness by identifying up to 3 activities/tasks he can safely participate in at home with sup A verbal cues SLP Short Term Goal 3 (Week 1): Patient will complete simulated medication/financial management tasks with sup A verbal cues to achieve 90% accuracy  Skilled Therapeutic Interventions: Skilled ST treatment focused on cognitive goals. Patient independently reported physical changes and how these changes are currently impacting his physical function. He demonstrated anticipatory awareness by reporting activities he wishes to engage in at home (e.g., getting out of bed, going to bathroom, showering) and how these tasks may need to be modified for his safety (i.e., using shower bench, grab bars, rail in hallway) with modified independence. Patient also organized BID pillbox with 5 medications with supervision A fading to modified independence to achieve 100% accuracy. Patient took his time, asked clarifying questions, and independently double checked pillbox for accuracy. Patient also engaged in problem solving with medication related hypothetical scenarios and provided appropriate and logical responses, spoke about calling the pharmacy to address medication questions, and anticipated asking his daughter for support for reminders to take his medications if he needed. Patient is steadily progressing toward ST and LT goals. Patient was left in wheelchair with alarm activated and immediate needs within reach at end of session. Continue per current plan of care.        Pain Pain Assessment Pain Scale: 0-10 Pain Score: 0-No pain  Therapy/Group: Individual Therapy  Patty Sermons 12/27/2020, 9:47 AM

## 2020-12-28 LAB — GLUCOSE, CAPILLARY
Glucose-Capillary: 100 mg/dL — ABNORMAL HIGH (ref 70–99)
Glucose-Capillary: 112 mg/dL — ABNORMAL HIGH (ref 70–99)

## 2020-12-28 MED ORDER — OXYCODONE-ACETAMINOPHEN 5-325 MG PO TABS: 1.0000 | ORAL_TABLET | Freq: Four times a day (QID) | ORAL | Status: DC | PRN

## 2020-12-28 NOTE — Progress Notes (Signed)
Speech Language Pathology Discharge Summary  Patient Details  Name: Joel Peck MRN: [id number] Date of Birth: 21-Oct-1935  Today's Date: 12/28/2020 SLP Individual Time: 1130-1200 SLP Individual Time Calculation (min): 30 min  Skilled Therapeutic Interventions:  Pt seen for skilled ST with focus on cognitive goals, son exiting upon SLP entry. Pt participating in functional math and functional problem solving tasks benefiting from Mod I cues. Pt reports being at cognitive baseline, able to recall important dates in the past and in future. Pt demonstrates adequate awareness to current physical and cognitive deficits, able to compensate with mod I. Pt has met STG and LTGs for skilled ST, recommend discharge from skilled ST due to reaching baseline function, with intermittent supervision for complex cognitive tasks.   Patient has met 2 of 2 long term goals.  Patient to discharge at overall Modified Independent level.   Clinical Impression/Discharge Summary:   Pt has made excellent progress during CIR and has met 2 out of 2 long term goals. Pt is currently Mod I for mildly complex cognitive tasks, able to manage and organize medications appropriately. Pt demonstrates functional recall for home environment, will benefit from intermittent supervision for complex cognitive tasks (daughter lives with him and other family available for assist PRN). Pt has been receptive to education regarding cognitive function, demonstrates teach back during sessions. No f/u service recommended at this time due to pt at cognitive baseline.   Care Partner:  Caregiver Able to Provide Assistance: Yes  Type of Caregiver Assistance: Physical;Cognitive  Recommendation:  Other (comment) (intermittent supervision with complex cognitive tasks)     Equipment:   none  Reasons for discharge: Treatment goals met   Patient/Family Agrees with Progress Made and Goals Achieved: Yes    [name] 12/28/2020, 11:57 AM

## 2020-12-28 NOTE — Progress Notes (Signed)
Physical Therapy Session Note  Patient Details  Name: Joel Peck MRN: [id number] Date of Birth: 04-17-1935  Today's Date: 12/28/2020 PT Individual Time: 1610-1710 PT Individual Time Calculation (min): 60 min   Short Term Goals: Week 1:  PT Short Term Goal 1 (Week 1): Pt will complete bed mobility with minA PT Short Term Goal 2 (Week 1): Pt will complete bed<>chair transfers with modA PT Short Term Goal 3 (Week 1): Pt will ambulate 23ft with modA and LRAD PT Short Term Goal 4 (Week 1): Pt will demonstrate improved L sided awareness  Skilled Therapeutic Interventions/Progress Updates:    Pt received seated in w/c in room, agreeable to PT session. No complaints of pain. Sit to stand with min A to RW with assist needed to place LUE in L hand splint. Stand pivot transfer to mat table with mod A with cues for sequencing of BLE and RW. Standing alt L/R 4" step-taps, 3 x 10 reps with min to mod A for standing balance with focus on LLE control in stance. Standing mini-squats 2 x 10 reps with RW and min A for standing balance, cues to maintain midline and achieve full L knee extension. Ambulation 2 x 140 ft with RW with min to mod A, occasional scissoring of LLE, min manual assist for weight shift onto RLE. Pt unsafe with turns with RW requiring mod A for balance and cues for safe sequencing of task. Pt very motivated and exhibits great participation in therapy session. Pt left seated in w/c in room with needs in reach, chair alarm in place at end of session.  Therapy Documentation Precautions:  Precautions Precautions: Fall Precaution Comments: L inattention/visual field cut, L hemi Restrictions Weight Bearing Restrictions: No       Therapy/Group: Individual Therapy   Excell Seltzer, PT, DPT, CSRS  12/28/2020, 5:19 PM

## 2020-12-28 NOTE — Progress Notes (Signed)
PROGRESS NOTE   Subjective/Complaints: Cough is better. He is thankful for tea He has no complaints Commended on his progress with therapy Nursing reports no concerns  ROS: Patient denies fever, rash, sore throat, blurred vision, nausea, vomiting, diarrhea,  shortness of breath or chest pain, joint or back pain, headache, or mood change. +throat discomfort- improved   Objective:   No results found. Recent Labs    12/27/20 0611  WBC 13.2*  HGB 12.7*  HCT 38.0*  PLT 342   Recent Labs    12/27/20 0611  NA 138  K 4.2  CL 102  CO2 26  GLUCOSE 108*  BUN 10  CREATININE 1.00  CALCIUM 9.1    Intake/Output Summary (Last 24 hours) at 12/28/2020 1014 Last data filed at 12/28/2020 0807 Gross per 24 hour  Intake 720 ml  Output 600 ml  Net 120 ml        Physical Exam: Vital Signs Blood pressure (!) 142/57, pulse 82, temperature 98.2 F (36.8 C), temperature source Oral, resp. rate 16, height 5\' 7"  (1.702 m), weight 77.1 kg, SpO2 100 %.  Gen: no distress, normal appearing HEENT: oral mucosa pink and moist, NCAT Cardio: Reg rate Chest: normal effort, normal rate of breathing Abd: soft, non-distended Ext: no edema  Psych: pleasant and cooperative  Skin: Clean and intact without signs of breakdown Neuro: oriented and alert. Reasonable insight and awareness. No CN signs. Left EF and EE 3/5 to 2/5 wrist,HI. LLE 3- HE, KE and 0/5 at ankle. Senses pain and light touch.  Musculoskeletal: normal rom. No jt swelling    Assessment/Plan: 1. Functional deficits which require 3+ hours per day of interdisciplinary therapy in a comprehensive inpatient rehab setting. Physiatrist is providing close team supervision and 24 hour management of active medical problems listed below. Physiatrist and rehab team continue to assess barriers to discharge/monitor patient progress toward functional and medical goals  Care  Tool:  Bathing              Bathing assist Assist Level: Moderate Assistance - Patient 50 - 74%     Upper Body Dressing/Undressing Upper body dressing Upper body dressing/undressing activity did not occur (including orthotics): Safety/medical concerns What is the patient wearing?: Hospital gown only    Upper body assist Assist Level: Moderate Assistance - Patient 50 - 74%    Lower Body Dressing/Undressing Lower body dressing    Lower body dressing activity did not occur: Safety/medical concerns What is the patient wearing?: Pants, Underwear/pull up     Lower body assist Assist for lower body dressing: Maximal Assistance - Patient 25 - 49%     Toileting Toileting    Toileting assist Assist for toileting: 2 Helpers     Transfers Chair/bed transfer  Transfers assist     Chair/bed transfer assist level: Moderate Assistance - Patient 50 - 74%     Locomotion Ambulation   Ambulation assist      Assist level: Moderate Assistance - Patient 50 - 74% Assistive device: Walker-rolling Max distance: 30ft   Walk 10 feet activity   Assist  Walk 10 feet activity did not occur: Safety/medical concerns  Assist level: Moderate Assistance -  Patient - 50 - 74% Assistive device: Walker-rolling   Walk 50 feet activity   Assist Walk 50 feet with 2 turns activity did not occur: Safety/medical concerns  Assist level: Moderate Assistance - Patient - 50 - 74% Assistive device: Walker-rolling    Walk 150 feet activity   Assist Walk 150 feet activity did not occur: Safety/medical concerns         Walk 10 feet on uneven surface  activity   Assist Walk 10 feet on uneven surfaces activity did not occur: Safety/medical concerns         Wheelchair     Assist Is the patient using a wheelchair?: Yes Type of Wheelchair: Manual    Wheelchair assist level: Dependent - Patient 0% Max wheelchair distance: 166ft    Wheelchair 50 feet with 2 turns  activity    Assist        Assist Level: Dependent - Patient 0%   Wheelchair 150 feet activity     Assist      Assist Level: Dependent - Patient 0%   Blood pressure (!) 142/57, pulse 82, temperature 98.2 F (36.8 C), temperature source Oral, resp. rate 16, height 5\' 7"  (1.702 m), weight 77.1 kg, SpO2 100 %.  Medical Problem List and Plan: 1. 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  Impaired mobility, ambulating 20- 55 feet: continue Lovenox             -antiplatelet therapy: DAPT X 3 months followed by ASA alone.  3. Pain: Decrease oxycodone to 1 tab q6H prn  4. Insomnia: continue trazodone 75mg  HS               -antipsychotic agents: N/A    -continue to provide egosupport and positive reinforcement 5. Neuropsych: This patient is capable of making decisions on his own behalf. 6. Skin/Wound Care: Routine pressure relief measures.              --monitor right neck incision for healing/edema.  7. Fluids/Electrolytes/Nutrition: Monitor I/O. Check lytes in am.  8. HTN: Monitor BP TID with SBP goals 110-120 to avoid hypoperfusion.              --On Norvasc and metoprolol BID.        Vitals:    12/21/20 1959 12/22/20 0456  BP: (!) 140/59 128/61  Pulse: 68 66  Resp: 18 18  Temp: 98.4 F (36.9 C) 98.4 F (36.9 C)  SpO2: 98% 97%  Controlled 12/26   9. AKI: SCr has risen from 1.03-->1.30. Likely due to dye/hypotension             --Encourage fluid intake. 12/21 recheck SCr improved to 0.9 10. Leucocytosis: Likely reactive due to surgery. Will monitor for signs of infection 11. ABLA: Likely due to thrombolytics/surgery with drop in  Hgb 14.3--->10.9---> 11.3 on  12/21 --Will monitor with serial CBC for trend as on DAPT/Lovenox  12. Hyperlipidemia: LDL 111. Continue Crestor.  13. BPH: Continue Flomax. Will check PVRs as reporting hesitancy.  14. Post CEA odynophagia/dysphagia: He is ordering appropriate foods.  Will monitor for now.  --he does not want diet adjusted at this time 15. Insomnia: Will increase melatonin to 5 mg. Family to spend night for support.  16. Chronic constipation-     -had large bm 12/22  -continue current mom and colace in pm 17. GERD- will increase Protonix- used Omeprazole at home.   18.  Odynophagia related to CEA -some crackles at right base on exam. CXR clear  -encourage IS, FV  Placed nursing order to bring him tea with honey daily.     LOS: 7 days A FACE TO FACE EVALUATION WAS PERFORMED  Joel Peck 12/28/2020, 10:14 AM

## 2020-12-28 NOTE — Progress Notes (Signed)
Physical Therapy Session Note  Patient Details  Name: Joel Peck MRN: [id number] Date of Birth: 1935/03/20  Today's Date: 12/28/2020 PT Individual Time: 3419-3790 PT Individual Time Calculation (min): 29 min   Short Term Goals: Week 1:  PT Short Term Goal 1 (Week 1): Pt will complete bed mobility with minA PT Short Term Goal 2 (Week 1): Pt will complete bed<>chair transfers with modA PT Short Term Goal 3 (Week 1): Pt will ambulate 45ft with modA and LRAD PT Short Term Goal 4 (Week 1): Pt will demonstrate improved L sided awareness  Skilled Therapeutic Interventions/Progress Updates:    Pt received sitting in w/c and eager to participate in therapy session. Transported to/from gym in w/c for time management and energy conservation. OT requested this PT assess pt's wheelchair cushion - pt reports he is extremely comfortable in his current cushion and is able to tolerate sitting upright, OOB for longer periods of time using current cushion. This therapist decided to leave patient in current Roho cushion and deflated it slightly to improve pressure relieving qualities of 0.5-1inch of cushion space under his ischial tuberosities. Sit<>stands during session with min assist for balance - therapist facilitating pt pushing up with L UE to promote NMR and proprioceptive input. Gait training ~180ft using RW with heavy min assist for balance - initially L LE adducts excessively to which pt responds well to verbal/visual cuing and is able to correct for the remainder of gait trial - adequate foot clearance and step length achieved for reciprocal pattern with some toe catching with fatigue - no knee instability noted during stance. Transported back to room and left seated in w/c with needs in reach, chair alarm on, and L UE supported on pillow.  Therapy Documentation Precautions:  Precautions Precautions: Fall Precaution Comments: L inattention/visual field cut, L hemi Restrictions Weight Bearing  Restrictions: No   Pain: No reports of pain throughout session.   Therapy/Group: Individual Therapy  Tawana Scale , PT, DPT, NCS, CSRS 12/28/2020, 12:35 PM

## 2020-12-28 NOTE — Progress Notes (Signed)
Occupational Therapy Session Note  Patient Details  Name: Joel Peck MRN: [id number] Date of Birth: 07/03/1935  Today's Date: 12/28/2020 OT Individual Time: 1305-1400 OT Individual Time Calculation (min): 55 min    Short Term Goals: Week 1:  OT Short Term Goal 1 (Week 1): Pt will demonstrate static standing balance with min assist for self care prep. OT Short Term Goal 2 (Week 1): Pt will use LUE as stabilizer with mod multimodal cues. OT Short Term Goal 3 (Week 1): Pt will donn shirt with min assist and min multimodal cues. OT Short Term Goal 4 (Week 1): Pt will complete toilet transfer with min assist.  Skilled Therapeutic Interventions/Progress Updates:  Skilled OT intervention completed with focus on LB bathing and dressing with AE education for increased independence with self-care tasks, and mass practice sit > stands in prep for ADL. Pt received seated in w/c, agreeable to session with family present. NT in room to assess vitals. Education with demonstrations, provided on using LH sponge, sock aid, and LH shoe horn to maximize pt's independence. Had pt sit up in w/c away from back rest for BLE to be supported on floor during tasks, with decreased recognition of midline presented with slow drifting lean > L side, with cues needed to correct throughout. Pt able to wash BLEs with supervision assist, with min A only used for rinsing/drying. Pt able to return demonstrate donning sock with sock aid using hemi-technique then thread sock on RLE with overall min A and cues throughout for efficiency. Pt with increased challenge on LLE due to limited active plantar flexion for sliding into funnel. Education provided to pt on ease of technique if using foldable sock aid vs plastic due to increased efficiency with threading sock with one hand, however therapist unable to locate foldable sock aid in the rehab dept. Pt able to return demonstrate and donn R shoe using LH shoe horn with supervision, with  min A needed only for lacing shoes, however similar difficulty of min A needed for donning L shoe. Education provided on where items could be purchased for use at home. Pt completed mass practice of functional sit > stands x5 with min A using mirror in room for visual feedback for midline orientation. Pt using RW for balance once standing, but demonstrated great postural control and balance with one hand assist on walker. Educated on leaning forward to promote ease with BLE weight loading for prepping to stand. Pt left seated in w/c per request, with sponges in L hand for gross grasping, chair alarm on, and all needs in reach at end of session.   Therapy Documentation Precautions:  Precautions Precautions: Fall Precaution Comments: L inattention/visual field cut, L hemi Restrictions Weight Bearing Restrictions: No  Pain: No c/o pain  Therapy/Group: Individual Therapy  Keia Rask E Tammala Weider 12/28/2020, 7:39 AM

## 2020-12-28 NOTE — Plan of Care (Signed)
Problem: RH Problem Solving ?Goal: LTG Patient will demonstrate problem solving for (SLP) ?Description: LTG:  Patient will demonstrate problem solving for basic/complex daily situations with cues  (SLP) ?Outcome: Completed/Met ?  ?Problem: RH Awareness ?Goal: LTG: Patient will demonstrate awareness during functional activites type of (SLP) ?Description: LTG: Patient will demonstrate awareness during functional activites type of (SLP) ?Outcome: Completed/Met ?  ?

## 2020-12-28 NOTE — Progress Notes (Signed)
Occupational Therapy Session Note  Patient Details  Name: Joel Peck MRN: [id number] Date of Birth: 13-Feb-1935  Today's Date: 12/28/2020 OT Individual Time: 7564-3329 OT Individual Time Calculation (min): 45 min    Short Term Goals: Week 1:  OT Short Term Goal 1 (Week 1): Pt will demonstrate static standing balance with min assist for self care prep. OT Short Term Goal 2 (Week 1): Pt will use LUE as stabilizer with mod multimodal cues. OT Short Term Goal 3 (Week 1): Pt will donn shirt with min assist and min multimodal cues. OT Short Term Goal 4 (Week 1): Pt will complete toilet transfer with min assist.  Skilled Therapeutic Interventions/Progress Updates:    Pt received in wc with his son present to observe. Pt agreeable to a bath and dressing. Pt demonstrated the amount of arm movement he has in his LUE. He is able to reach and move his arm partially but needs to work on integrating it into function to build strength.   Pt positioned at sink to work on UB first.  Hand over hand guiding of L hand to wash R arm. With L hand placed on top of R forearm, he could actively flex biceps to move L hand from R wrist to R elbow.  He was also able to actively grip deoderant to use R hand to remove and replace cap. Cues to actively push L arm through shirt sleeve vs dependently lifting. Min A with donning shirt using hemi-techniques.  For LB self care worked on several sit to stands with cues for forward lean and to push up from arm rest, and to have feet in good position.  With these cues, pt consistently rose sit to stand to sink with only CGA and then he was able to place hand on sink for support.  In standing with fluctuating from CGA to mod A (depending what pt was doing),  he worked on self bathing and adjusting clothing over hips.  He needed more standing A when he was in a squat to reach to wash his bottom due to increased lean on L side. Worked on finding his center of balance this session. Pt  able to achieve more control of this dynamic stance during the session. He would benefit from a lot of practice in the gym with reaching to each side and rotating torso.  Time did not allow for washing feet and donning sneakers. Will inform his next OT.    See ADL documentation below for details. Excellent participation. Pt resting in wc with alarm on and all needs met.     Therapy Documentation Precautions:  Precautions Precautions: Fall Precaution Comments: L inattention/visual field cut, L hemi Restrictions Weight Bearing Restrictions: No    Vital Signs: Therapy Vitals Pulse Rate: 82 BP: (!) 142/57 Pain: Pain Assessment Pain Scale: 0-10 Pain Score: 0-No pain ADL: ADL Upper Body Bathing: Minimal assistance Where Assessed-Upper Body Bathing: Sitting at sink Lower Body Bathing: Moderate assistance Where Assessed-Lower Body Bathing: Sitting at sink, Standing at sink Upper Body Dressing: Minimal assistance Where Assessed-Upper Body Dressing: Wheelchair Lower Body Dressing: Moderate assistance, Moderate cueing Where Assessed-Lower Body Dressing: Wheelchair Toileting: Minimal assistance (urinal) Where Assessed-Toileting: Bed level     Therapy/Group: Individual Therapy  Melenie Minniear 12/28/2020, 11:42 AM

## 2020-12-29 ENCOUNTER — Inpatient Hospital Stay (HOSPITAL_COMMUNITY): Payer: PPO

## 2020-12-29 NOTE — Progress Notes (Signed)
Occupational Therapy Session Note  Patient Details  Name: Joel Peck MRN: [id number] Date of Birth: 27-Dec-1935  Today's Date: 12/29/2020 OT Individual Time: 8453-6468 OT Individual Time Calculation (min): 60 min    Short Term Goals: Week 1:  OT Short Term Goal 1 (Week 1): Pt will demonstrate static standing balance with min assist for self care prep. OT Short Term Goal 2 (Week 1): Pt will use LUE as stabilizer with mod multimodal cues. OT Short Term Goal 3 (Week 1): Pt will donn shirt with min assist and min multimodal cues. OT Short Term Goal 4 (Week 1): Pt will complete toilet transfer with min assist. Week 2:    Week 3:     Skilled Therapeutic Interventions/Progress Updates:   The pt complete UB theraex to improve his UB strength for gains with independence during initiation and completion of BADL related task performance.Marland Kitchen He completed guided assist bicep curls  and horizontal abduction using a 2lb dumb bell 2 sets of 15 with rest breaks as needed.  The pt was encouraged to incorporate relaxation breathing to improve compliance.  The pt completed resistive exercises using bilateral UE for gains in strength and to improve the quality of his movements. The pt was instructed to envision what his object is and generate the movement without jerking the LUE.  The pt was instructed in relation of placement of the joint and ways to minimize his risk for subluxation of the joint.  The pt went on to complete fist pump using his LUEand was able to complete visual scanning while occluding the R eye for gains in visual acuity with the L eye. The pt completed sit to stands at sink level of function with MinA using the hemi hand placement RW, he went on to wash his face with s/u A .  The pt returned to his w/c with call light and bedside table in place, and his alarm activated.  The pt had no additional needs to  be addressed and he had no c/o pain during this OT session.   Therapy  Documentation Precautions:  Precautions Precautions: Fall Precaution Comments: L inattention/visual field cut, L hemi Restrictions Weight Bearing Restrictions: No General:   Vital Signs:  Pain:   A Vision   Perception    Praxis   Balance   Exercises:   Other Treatments:     Therapy/Group: Individual Therapy  Yvonne Kendall 12/29/2020, 12:36 PM

## 2020-12-29 NOTE — Progress Notes (Signed)
Physical Therapy Session Note  Patient Details  Name: Joel Peck MRN: [id number] Date of Birth: 02-09-1935  Today's Date: 12/29/2020 PT Missed Time:   Missed Time Reason:    Short Term Goals: Week 1:  PT Short Term Goal 1 (Week 1): Pt will complete bed mobility with minA PT Short Term Goal 2 (Week 1): Pt will complete bed<>chair transfers with modA PT Short Term Goal 3 (Week 1): Pt will ambulate 25ft with modA and LRAD PT Short Term Goal 4 (Week 1): Pt will demonstrate improved L sided awareness  Skilled Therapeutic Interventions/Progress Updates:    Patient received sitting up in wc, agreeable to PT. He denies pain, but complains of cough. Rn aware and provided rx. Patient voicing frustration that despite cough rx, he's coughing up more sputum. PT educating patient on MOA of rx and how we want him to cough up sputum to get it out of his lungs. Patient voiced understanding. PT providing patient with incentive spirometer. He demonstrated competency with deep inhalation x5. PT transporting patient in wc to therapy gym for time management and energy conservation. He ambulated ~271ft with RW and CGA/MinA. Verbal cues for increased L foot clearance, wider BOS and to avoid obstacles on L. Patient then weaving through cones ~66ft with MinA. Initially, patient making 90*turns through cones and needed increased cuing to avoid obstacles on L. Patients L inattention much more apparent when forced to ambulate through narrow passages, as he wound in his home. PT attempting to educate patient on this- he will benefit from further education. Patient ambulating through cones again with improved gait mechanics and management of walker. Patient returning to room in wc. IS x10 with verbal cues for slow, deep inhalation. PT educating patient on exercise of respiratory muscles. Receptive to education. Patient remaining up in wc, chair alarm on, call light within reach.   Therapy Documentation Precautions:   Precautions Precautions: Fall Precaution Comments: L inattention/visual field cut, L hemi Restrictions Weight Bearing Restrictions: No  Other Treatments:      Therapy/Group: Individual Therapy  Karoline Caldwell, PT, DPT, CBIS  12/29/2020, 7:50 AM

## 2020-12-29 NOTE — Progress Notes (Signed)
Patient ID: Joel Peck, male   DOB: 11-24-1935, 85 y.o.   MRN: [id number]  Met with pt to update him regarding team conference supervision-min assist level and target discharge date of 1/10. Also spoke with daughter-Amanda via telephone to inform team conference and her father's need for 24/7 care at discharge. Discussed scheduling family education next week with two daughter's and son who will be providing 24/7 care at home. Estill Bamberg will get back with worker regarding time and day that works for all of them. Continue to work on discharge needs.

## 2020-12-29 NOTE — Progress Notes (Signed)
Pt requested a nurse, c/o of cough that is not getting any better, requested MD to evaluate, pt reports sputum that is green. Had pt cough and was clear to light yellow, pt reports that cough medication not working not working and wants to see a doctor, last does of Robitussin was last night, informed that just had chest xray, said aware but getting worse. Will notify PA

## 2020-12-29 NOTE — Patient Care Conference (Signed)
Inpatient RehabilitationTeam Conference and Plan of Care Update Date: 12/29/2020   Time: 10:59 AM    Patient Name: Joel Peck      Medical Record Number: [id number]  Date of Birth: [birth year] Sex: Male         Room/Bed: 4W13C/4W13C-01 Payor Info: Payor: [employer] / Plan: [employer] Must PPO / Product Type: *No Product type* /    Admit Date/Time:  12/21/2020  4:05 PM  Primary Diagnosis:  Acute ischemic right MCA stroke [facility])  Hospital Problems: Principal Problem:   Acute ischemic right MCA stroke [facility])    Expected Discharge Date: Expected Discharge Date: 01/11/21  Team Members Present: Physician leading conference: Dr. Leeroy Cha Social Worker Present: Ovidio Kin, LCSW Nurse Present: Dorien Chihuahua, RN PT Present: Francena Hanly, PT OT Present: Clyda Greener, OT SLP Present: Lillie Columbia, SLP     Current Status/Progress Goal Weekly Team Focus  Bowel/Bladder   Patient is continent of bowel and bladder  Pt goal has been met and will remain continent      Swallow/Nutrition/ Hydration             ADL's   Min assist for UB bathing with mod assist for UB dressing.  LB dressing at min to mod assist as well.  CGA-supervision  Selfcare retraining, transfer training, visual scanning, therapeutic activities, pt/family education, balance retraining, neuromuscular re-education   Mobility   grossly ModA, gait trials going well on LiteGait  CGA/spv  Gait progressions, balance, pt/fam ed, transfers, midline orientation   Communication             Safety/Cognition/ Behavioral Observations            Pain   Pt denies any pain at this time  Pt will remain free of pain  will assess qshift and PRN   Skin   Pt skin is intact  Pt skin will remain intact  will assess qshift and PRN     Discharge Planning:  Daughter lives with him but does work other family members to come in and out and if needed can be there with him while she works   Team  Discussion: Patient with right MCA CVA, congested cough. Using tea/robitussin prn for cough.  Patient on target to meet rehab goals: yes, currently needs min assist for upper body care and CGA for lower body care. Progress limited by left lean, mid line orientation issues. Goals for discharge set for CGA - supervision overall.  *See Care Plan and progress notes for long and short-term goals.   Revisions to Treatment Plan:  SLP services discontinued Lite gait trials for balance/gait   Teaching Needs: Safety, medications, secondary risk management, transfers, toileting, etc.   Current Barriers to Discharge: Decreased caregiver support  Possible Resolutions to Barriers: Family education with daughter     Medical Summary Current Status: continent, sleeping better so Xanax has been discontinued, insicion healing well, left sided lean, daughter works during the day, hypertension, acute right ischemic stroke  Barriers to Discharge: Medical stability;Decreased family/caregiver support  Barriers to Discharge Comments: left sided lean, hypertension- blood pressure has been elevated, acute right ischemic stroke, constipation, insomnia Possible Resolutions to [employer] Focus: continue amlodipine 65m, continue flomax, continue aspirin, continue colace, conitnue melatonin, continue crestor   Continued Need for Acute Rehabilitation Level of Care: The patient requires daily medical management by a physician with specialized training in physical medicine and rehabilitation for the following reasons: Direction of a multidisciplinary physical rehabilitation program to maximize functional  independence : Yes Medical management of patient stability for increased activity during participation in an intensive rehabilitation regime.: Yes Analysis of laboratory values and/or radiology reports with any subsequent need for medication adjustment and/or medical intervention. : Yes   I attest that I was  present, lead the team conference, and concur with the assessment and plan of the team.   Dorien Chihuahua B 12/29/2020, 3:03 PM

## 2020-12-29 NOTE — Progress Notes (Signed)
Physical Therapy Session Note  Patient Details  Name: Joel Peck MRN: [id number] Date of Birth: 03/15/1935  Today's Date: 12/29/2020 PT Individual Time: 3643-8377 PT Individual Time Calculation (min): 58 min   Short Term Goals: Week 1:  PT Short Term Goal 1 (Week 1): Pt will complete bed mobility with minA PT Short Term Goal 2 (Week 1): Pt will complete bed<>chair transfers with modA PT Short Term Goal 3 (Week 1): Pt will ambulate 34f with modA and LRAD PT Short Term Goal 4 (Week 1): Pt will demonstrate improved L sided awareness  Skilled Therapeutic Interventions/Progress Updates: Pt presented in w/c agreeable to therapy. Pt denies pain during session. Session to focus on gait, function transfers and endurance. PTA assisted with doffing gown and donned shirt with minA. PTA threaded pants total A and pt performed Sit to stand in [place] PTA pulled up pants total A for time management. PTA donned shoes total A for time management. Pt then transported to rehab gym for energy conservation. Pt ambulated ~1575fwith RW and minA including left turns. Pt required min cues for increased L abduction, erect posture, increased step length. Pt required more modA when stepping backwards to w/c. Pt participated in toe taps to 4in step for forced use of LLE and attempting to touch target for coordination. Pt then performed alternating toe taps to 4in step for weight shifting and coordination with cues to pt to maintain neutral (initally with Left lean) during activity. Pt moved to parallel bars and participated in gait forward/backwards [id number] 4 for coordination of LLE. Pt also performed side stepping in parallel bars over rungs of agility ladder. Pt able to perform with max multimodal cues for LLE placement. Pt then transported back to room at end of session and remained in w/c with belt alarm on, call bell within reach and needs met.       Therapy Documentation Precautions:  Precautions Precautions:  Fall Precaution Comments: L inattention/visual field cut, L hemi Restrictions Weight Bearing Restrictions: No General:   Vital Signs: Therapy Vitals Temp: 99.1 F (37.3 C) Temp Source: Oral Pulse Rate: 79 Resp: 19 BP: (!) 147/69 Patient Position (if appropriate): Sitting Oxygen Therapy SpO2: 97 % O2 Device: Room Air Pain:   Mobility:   Locomotion :    Trunk/Postural Assessment :    Balance:   Exercises:   Other Treatments:      Therapy/Group: Individual Therapy  Jiali Linney 12/29/2020, 3:56 PM

## 2020-12-29 NOTE — Progress Notes (Signed)
Physical Therapy Session Note  Patient Details  Name: Joel Peck MRN: [id number] Date of Birth: 01-01-1936  Today's Date: 12/29/2020 PT Individual Time: 1300-1345 PT Individual Time Calculation (min): 45 min   Short Term Goals: Week 1:  PT Short Term Goal 1 (Week 1): Pt will complete bed mobility with minA PT Short Term Goal 2 (Week 1): Pt will complete bed<>chair transfers with modA PT Short Term Goal 3 (Week 1): Pt will ambulate 93ft with modA and LRAD PT Short Term Goal 4 (Week 1): Pt will demonstrate improved L sided awareness  Skilled Therapeutic Interventions/Progress Updates:     Patient in w/c in the room upon PT arrival. Patient alert and agreeable to PT session. Patient denied pain during session.  Focused session on high intensity gait training progressing without AD.   Therapeutic Activity: Transfers: Patient performed sit to/from stand progressing from the RW with L hand splint, to a R rail, to no AD x2 with CGA. Provided verbal cues for forward weight shift and completing turn before sitting for safety.  Gait Training:  Trial 1: 178 feet with 2x180 deg turns with RW with L hand splint with CGA-min A, increased assist and cues with turning L>R Trial 2: 200 feet with 2x180 deg turnswith L arm over therapist's shoulder focused on increased pace and weight shift with therapist facilitating weight shifting and forward propulsion with mod A Trial 3: 200 feet with 2x180 deg turns without AD focused on erect posture, increased gait speed and arm swing for improved balance with min-mod A for weight shifting and balance Trial 4: 200 feet with 2x180 deg turns without AD focused on erect posture, increased gait speed and arm swing for improved balance with min A for weight shifting and balance Ambulated with decreased L hamstring activation in pre-swing, decreased L hip flexor activation during swing, decreased L weight shift, increased L trunk lean, and intermittent L toe  catching. Provided verbal cues for correct muscle activation in swing phase, leading with L heel at initial contact to improve foot clearance, erect posture and looking ahead, increased arm swing bilaterally for improved balance without AD, and stopping to find his balance before initiation turns for safety. Provided 2-3 min rest breaks between trials due to decreased activity tolerance, educated on gait mechanics and performed L upper extremity AAROM exercises: scapular retraction x10, elbow flexion/extension x10, finger flexion/extension x10.  Patient in w/c in the room at end of session with breaks locked, chair alarm set, and all needs within reach. Patient very pleased with his progress during session.  Therapy Documentation Precautions:  Precautions Precautions: Fall Precaution Comments: L inattention/visual field cut, L hemi Restrictions Weight Bearing Restrictions: No    Therapy/Group: Individual Therapy  Jasey Cortez L Winton Offord PT, DPT  12/29/2020, 4:02 PM

## 2020-12-29 NOTE — Progress Notes (Signed)
Notified of persistent cough by RN. Patient relates cough was better yesterday, worse with sputum production. He says he is "hacking" the secretions up.  Crackles heard in right base on yesterday's exam. Denies feeling tight in chest. Remote history of smoking. Denies acid reflux (on PPI). Robitussin helping at night time.  He is awake, alert. No respiratory distress. Decreased breath sounds on right with + crackles. Has clear sputum versus oral secretions in cup.   Will get PA/Lat chest x-ray. Instructed on IS use and advised him try to use at least hourly.   Vitals:   12/29/20 0556 12/29/20 1244  BP: (!) 153/69 (!) 147/69  Pulse: 70 79  Resp: 18 19  Temp: 98.1 F (36.7 C) 99.1 F (37.3 C)  SpO2: 99% 97%    Narrative & Impression  CLINICAL DATA:  Acute right MCA stroke.  Cough.   EXAM: CHEST - 2 VIEW   COMPARISON:  Chest x-ray 12/11/2020   FINDINGS: The heart size and mediastinal contours are within normal limits. Both lungs are clear. The visualized skeletal structures are unremarkable.   IMPRESSION: No active cardiopulmonary disease.     Electronically Signed   By: Ronney Asters M.D.   On: 12/22/2020 16:23

## 2020-12-30 NOTE — Progress Notes (Signed)
Physical Therapy Weekly Progress Note  Patient Details  Name: Joel Peck MRN: [id number] Date of Birth: 1935/12/11  Beginning of progress report period: December 22, 2020 End of progress report period: December 30, 2020  Today's Date: 12/30/2020 PT Individual Time: 0800-0855 PT Individual Time Calculation (min): 55 min   Patient has met 4 of 4 short term goals. Pt making appropriate progress towards LTG, this reporting period. He continues to be highly motivated and showing great participation in therapies to maximize his potential. Overall, he requires minA for bed mobility, minA for stand<>pivot transfers, and minA for ambulating ~140f with RW. He is primarily limited by L sided weakness, L inattention, static and dynamic balance deficits, L proprioceptive deficits. Family education has not been initiated yet but anticipate this to begin prior to next reporting period. Continue PT POC.   Patient continues to demonstrate the following deficits muscle weakness, decreased cardiorespiratoy endurance, motor apraxia, and decreased standing balance, decreased postural control, hemiplegia, and decreased balance strategies and therefore will continue to benefit from skilled PT intervention to increase functional independence with mobility.  Patient progressing toward long term goals..  Continue plan of care.  PT Short Term Goals Week 2:  PT Short Term Goal 1 (Week 2): Pt will complete bed mobility with CGA PT Short Term Goal 2 (Week 2): Pt will complete bed<>chair transfers with CGA and LRAD PT Short Term Goal 3 (Week 2): Pt will ambulate 1564fwith CGA and LRAD PT Short Term Goal 4 (Week 2): Pt will navigate 6inch curb with minA and LRAD PT Short Term Goal 5 (Week 2): Family education will be initiated  Skilled Therapeutic Interventions/Progress Updates:     Pt presents sitting in w/c, agreeable to PT tx without reports of pain. He does request assistance using the toilet. Wheeled in w/c to  bathroom and completed stand<>pivot transfer with minA using grab bars. Pt continent of bladder while sitting on toilet. Required totalA for brief management in standing as pt relies on UE support for balance. Assisted in LB dressing [place] and UB dressing with setupA assist with good recall for hemi technique dressing. Donned socks/shoes with maxA.  Transported in w/c to rehab gym. Instructed in sit<>stand to RW where he required minA and then CGA for balance whie he placed his paretic LUE into RW orthotic (required modA for placement). Ambulated ~16516fith minA and RW - demonstrating improved abduction of LLE to avoid narrow BOS - required cues for visual scanning while turning L due to L inattention. Noted some foot drag and toe catching on LLE with fatigue after ~125f72ft then completed stair training where he navigated up/down x12 steps using 2 hand rails, requiring minA for balance and minA for managing LUE along rail. Discussed DC planning, home safety, f/u therapies, during rest break. Pt concluded session seated in w/c with chair alarm on and all needs within reach.   Therapy Documentation Precautions:  Precautions Precautions: Fall Precaution Comments: L inattention/visual field cut, L hemi Restrictions Weight Bearing Restrictions: No General:     Therapy/Group: Individual Therapy  Martyn Timme P Deaundre Allston PT 12/30/2020, 7:31 AM

## 2020-12-30 NOTE — Progress Notes (Signed)
Occupational Therapy Session Note  Patient Details  Name: Joel Peck MRN: [id number] Date of Birth: 1935/03/14  Today's Date: 12/30/2020 OT Individual Time: 1100-1200 OT Individual Time Calculation (min): 60 min    Short Term Goals: Week 1:  OT Short Term Goal 1 (Week 1): Pt will demonstrate static standing balance with min assist for self care prep. OT Short Term Goal 2 (Week 1): Pt will use LUE as stabilizer with mod multimodal cues. OT Short Term Goal 3 (Week 1): Pt will donn shirt with min assist and min multimodal cues. OT Short Term Goal 4 (Week 1): Pt will complete toilet transfer with min assist.  Skilled Therapeutic Interventions/Progress Updates:  Pt greeted  seated in w/c   agreeable to OT intervention. Session focus on LUE [name], functional grasp with L hand, LUE AROM, functional mobility, dynamic standing balance and decreasing overall caregiver burden.    Pt transported to gym with total A where pt completed ambualtory transfer to straight back chair with rw and MN A. Worked on wrist supination/pronation and digit flexion/extension. Pt able to supinate/pronate wrist and grasp small objects such as jenga blocks with gross grasp. Pt with 2+/5 finger flexion but with 1/5 finger extension. Pt able to complete scapular protraction/retraction with LUE to reach for bean bag with LUE.  Pt worked on [name] and AROM with 1 lb weighted ball ( L hand underneath ball and R hand on top) to achieve scapular protraction/retraction with OTA providing hand over hand assist for 2x20 reps. Pt able to roll weighted ball on table R<>L x20 reps to facilitate active shoulder horizontal ADD/ABD as well as rolling ball forward and backwards promoting active ROM scapular protraction/retraction. Pt able to complete dynamic standing balance task where pt able to stand from chair with arm rests with CGA and use LUE to reach for clothespins clasped to mirror using gross grasp. Pt able to reach for 8 clothespins with  CGA for dynamic standing balance. Pt completed functional mobility back to pts room with rw and MIN A. pt left seated in w/c with chair alarm activated and all needs within reach.                   Therapy Documentation Precautions:  Precautions Precautions: Fall Precaution Comments: L inattention/visual field cut, L hemi Restrictions Weight Bearing Restrictions: No  Pain: no pain reported during session     Therapy/Group: Individual Therapy  [name] 12/30/2020, 12:23 PM

## 2020-12-30 NOTE — Progress Notes (Signed)
Patient ID: Joel Peck, male   DOB: 1935/06/01, 85 y.o.   MRN: [id number] Amanda-daughter called with day and time for education. They can be here 1/6 @ 1:00-3:00 both children and son in-law will be here will let team know and work toward this.

## 2020-12-30 NOTE — Progress Notes (Signed)
Occupational Therapy Weekly Progress Note  Patient Details  Name: Joel Peck MRN: [id number] Date of Birth: 1935/05/02  Beginning of progress report period: December 22, 2020 End of progress report period: December 30, 2020  Today's Date: 12/30/2020  Patient has met 4 of 4 short term goals. Pt making great progress towards OT goals this reporting period. Pt currently requires supervision for bathing at sink, MIN A for UB dressing, MOD A for LB dressing and MIN A for transfers with Rw. Pt continues to present with impaired LUE AROM with biggest deficit being impaired functional grasp and release with L hand. However pt continues to be very motivated and is progressing towards OT goals. Pt with strong family support with family education scheduled for 1/6.   Patient continues to demonstrate the following deficits: muscle weakness, impaired timing and sequencing, unbalanced muscle activation, decreased coordination, and decreased motor planning, decreased midline orientation and decreased attention to left, delayed processing, and decreased standing balance, decreased postural control, hemiplegia, and decreased balance strategies and therefore will continue to benefit from skilled OT intervention to enhance overall performance with BADL.  Patient progressing toward long term goals..  Continue plan of care.  OT Short Term Goals Week 1:  OT Short Term Goal 1 (Week 1): Pt will demonstrate static standing balance with min assist for self care prep. OT Short Term Goal 1 - Progress (Week 1): Met OT Short Term Goal 2 (Week 1): Pt will use LUE as stabilizer with mod multimodal cues. OT Short Term Goal 2 - Progress (Week 1): Met OT Short Term Goal 3 (Week 1): Pt will donn shirt with min assist and min multimodal cues. OT Short Term Goal 3 - Progress (Week 1): Met OT Short Term Goal 4 (Week 1): Pt will complete toilet transfer with min assist. OT Short Term Goal 4 - Progress (Week 1): Met Week 2:  OT  Short Term Goal 1 (Week 2): pt will complete UB dressing with CGA using hemi techniques as needed OT Short Term Goal 2 (Week 2): pt will complete 2/3 parts of toileting with MIN A OT Short Term Goal 3 (Week 2): pt will complete 1 grooming task in standing with CGA   [name] 12/30/2020, 12:31 PM

## 2020-12-30 NOTE — Progress Notes (Signed)
Occupational Therapy Session Note  Patient Details  Name: Joel Peck MRN: [id number] Date of Birth: [birth year]  Today's Date: 12/30/2020 OT Individual Time: 1345-1430 OT Individual Time Calculation (min): 45 min    Short Term Goals: Week 1:  OT Short Term Goal 1 (Week 1): Pt will demonstrate static standing balance with min assist for self care prep. OT Short Term Goal 2 (Week 1): Pt will use LUE as stabilizer with mod multimodal cues. OT Short Term Goal 3 (Week 1): Pt will donn shirt with min assist and min multimodal cues. OT Short Term Goal 4 (Week 1): Pt will complete toilet transfer with min assist.  Skilled Therapeutic Interventions/Progress Updates:    Pt received sitting in the w/c with no c/o pain. Pt was taken to the therapy gym. Stand pivot transfer using the RW with min A. Pt completed functional reaching task with bean bags focusing on grasp/release and shoulder flexion with cueing required for reducing shoulder elevation. Mod facilitation required for shoulder flexion greater than 30 degrees. He required min facilitation for gross grasp of bean bags and for activation of finger extension to release. Worked on closed chain shoulder flexion bilaterally with grasp maintained on a horseshoe, mod facilitation required for proper motor pattern of scapulohumeral rhythm. Worked on scapular elevation and protraction/retraction with pt demonstrating difficulty motor planning R or L activation despite visual and tactile cues. Pt returned to his w/c and then to his room. He was left sitting up with all needs met, chair alarm set.   Therapy Documentation Precautions:  Precautions Precautions: Fall Precaution Comments: L inattention/visual field cut, L hemi Restrictions Weight Bearing Restrictions: No   Therapy/Group: Individual Therapy  Curtis Sites 12/30/2020, 6:12 AM

## 2020-12-30 NOTE — Progress Notes (Signed)
Occupational Therapy Session Note  Patient Details  Name: Joel Peck MRN: [id number] Date of Birth: 02-25-1935  Today's Date: 12/30/2020 OT Individual Time: 0930-1030 OT Individual Time Calculation (min): 60 min    Short Term Goals: Week 1:  OT Short Term Goal 1 (Week 1): Pt will demonstrate static standing balance with min assist for self care prep. OT Short Term Goal 2 (Week 1): Pt will use LUE as stabilizer with mod multimodal cues. OT Short Term Goal 3 (Week 1): Pt will donn shirt with min assist and min multimodal cues. OT Short Term Goal 4 (Week 1): Pt will complete toilet transfer with min assist.  Skilled Therapeutic Interventions/Progress Updates:    Pt resting in w/c upon arrival. OT intervention with focus on LUE finger extension/grasp, shoulder flexion/extension, and elbow flexion/extension. Emphasis on sitting posture when engaging LUE in motor tasks and isolation of muscle groups to encourage correct technique. Pt issued red foam block. Pt remained in w/c with all needs within reach. Belt alarm activated.   Therapy Documentation Precautions:  Precautions Precautions: Fall Precaution Comments: L inattention/visual field cut, L hemi Restrictions Weight Bearing Restrictions: No  Pain: Pain Assessment Pain Scale: 0-10 Pain Score: 0-No pain   Therapy/Group: Individual Therapy  Leroy Libman 12/30/2020, 12:13 PM

## 2020-12-30 NOTE — Progress Notes (Signed)
PROGRESS NOTE   Subjective/Complaints: Cough improved. Explained that cough is body's mechanism to expel mucus and mucus is body's weigh to get rid of bacteria. He has been using incentive spirometer- explained that this can strengthen and expand his lungs  ROS: Patient denies fever, rash, sore throat, blurred vision, nausea, vomiting, diarrhea,  shortness of breath or chest pain, joint or back pain, headache, or mood change. +throat discomfort- improved, +cough   Objective:   DG Chest 2 View  Result Date: 12/29/2020 CLINICAL DATA:  Cough EXAM: CHEST - 2 VIEW COMPARISON:  12/22/2020 FINDINGS: Linear atelectasis left base. No consolidation or effusion. Stable cardiomediastinal silhouette with aortic atherosclerosis. Degenerative changes of the spine. IMPRESSION: No active cardiopulmonary disease. Electronically Signed   By: Donavan Foil M.D.   On: 12/29/2020 21:09   No results for input(s): WBC, HGB, HCT, PLT in the last 72 hours.  No results for input(s): NA, K, CL, CO2, GLUCOSE, BUN, CREATININE, CALCIUM in the last 72 hours.   Intake/Output Summary (Last 24 hours) at 12/30/2020 1225 Last data filed at 12/30/2020 0700 Gross per 24 hour  Intake 300 ml  Output 400 ml  Net -100 ml        Physical Exam: Vital Signs Blood pressure (!) 152/72, pulse 67, temperature 98.7 F (37.1 C), temperature source Oral, resp. rate 18, height 5\' 7"  (1.702 m), weight 77.1 kg, SpO2 98 %.  Gen: no distress, normal appearing HEENT: oral mucosa pink and moist, NCAT Cardio: Reg rate Chest: normal effort, normal rate of breathing Abd: soft, non-distended Ext: no edema   Psych: pleasant and cooperative  Skin: Clean and intact without signs of breakdown Neuro: oriented and alert. Reasonable insight and awareness. No CN signs. Left EF and EE 3/5 to 2/5 wrist,HI. LLE 3- HE, KE and 0/5 at ankle. Senses pain and light touch.  Musculoskeletal:  normal rom. No jt swelling    Assessment/Plan: 1. Functional deficits which require 3+ hours per day of interdisciplinary therapy in a comprehensive inpatient rehab setting. Physiatrist is providing close team supervision and 24 hour management of active medical problems listed below. Physiatrist and rehab team continue to assess barriers to discharge/monitor patient progress toward functional and medical goals  Care Tool:  Bathing    Body parts bathed by patient: Right lower leg, Left lower leg   Body parts bathed by helper: Left lower leg, Right lower leg, Right arm     Bathing assist Assist Level: Supervision/Verbal cueing     Upper Body Dressing/Undressing Upper body dressing Upper body dressing/undressing activity did not occur (including orthotics): Safety/medical concerns What is the patient wearing?: Pull over shirt    Upper body assist Assist Level: Minimal Assistance - Patient > 75%    Lower Body Dressing/Undressing Lower body dressing    Lower body dressing activity did not occur: Safety/medical concerns What is the patient wearing?: Pants, Incontinence brief     Lower body assist Assist for lower body dressing: Moderate Assistance - Patient 50 - 74%     Toileting Toileting    Toileting assist Assist for toileting: 2 Helpers     Transfers Chair/bed transfer  Transfers assist  Chair/bed transfer assist level: Minimal Assistance - Patient > 75%     Locomotion Ambulation   Ambulation assist      Assist level: Minimal Assistance - Patient > 75% Assistive device: Walker-rolling Max distance: 188ft   Walk 10 feet activity   Assist  Walk 10 feet activity did not occur: Safety/medical concerns  Assist level: Minimal Assistance - Patient > 75% Assistive device: Walker-rolling   Walk 50 feet activity   Assist Walk 50 feet with 2 turns activity did not occur: Safety/medical concerns  Assist level: Minimal Assistance - Patient >  75% Assistive device: Walker-rolling    Walk 150 feet activity   Assist Walk 150 feet activity did not occur: Safety/medical concerns  Assist level: Minimal Assistance - Patient > 75% Assistive device: Walker-rolling    Walk 10 feet on uneven surface  activity   Assist Walk 10 feet on uneven surfaces activity did not occur: Safety/medical concerns         Wheelchair     Assist Is the patient using a wheelchair?: Yes Type of Wheelchair: Manual    Wheelchair assist level: Dependent - Patient 0% Max wheelchair distance: 112ft    Wheelchair 50 feet with 2 turns activity    Assist        Assist Level: Dependent - Patient 0%   Wheelchair 150 feet activity     Assist      Assist Level: Dependent - Patient 0%   Blood pressure (!) 152/72, pulse 67, temperature 98.7 F (37.1 C), temperature source Oral, resp. rate 18, height 5\' 7"  (1.702 m), weight 77.1 kg, SpO2 98 %.  Medical Problem List and Plan: 1. 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  Impaired mobility, ambulating 20- 55 feet: continue Lovenox             -antiplatelet therapy: DAPT X 3 months followed by ASA alone.  3. Pain: Decrease oxycodone to 1 tab q6H prn  4. Insomnia: continue trazodone 75mg  HS               -antipsychotic agents: N/A    -continue to provide egosupport and positive reinforcement 5. Neuropsych: This patient is capable of making decisions on his own behalf. 6. Skin/Wound Care: Routine pressure relief measures.              --monitor right neck incision for healing/edema.  7. Fluids/Electrolytes/Nutrition: Monitor I/O.  Check lytes in am.  8. HTN: Monitor BP TID with SBP goals 110-120 to avoid hypoperfusion.              --On Norvasc and metoprolol BID.        Vitals:    12/21/20 1959 12/22/20 0456  BP: (!) 140/59 128/61  Pulse: 68 66  Resp: 18 18  Temp: 98.4 F (36.9 C) 98.4 F (36.9 C)  SpO2: 98% 97%  Controlled 12/26   9. AKI: SCr has risen from 1.03-->1.30. Likely due to dye/hypotension             --  Encourage fluid intake. 12/21 recheck SCr improved to 0.9 10. Leucocytosis: Likely reactive due to surgery. Will monitor for signs of infection 11. ABLA: Likely due to thrombolytics/surgery with drop in  Hgb 14.3--->10.9---> 11.3 on 12/21 --Will monitor with serial CBC for trend as on DAPT/Lovenox  12. Hyperlipidemia: LDL 111. Continue Crestor.  13. BPH: Continue Flomax. Will check PVRs as reporting hesitancy.  14. Post CEA odynophagia/dysphagia: He is ordering appropriate foods. Will monitor for now.  --he does not want diet adjusted at this time 15. Insomnia: Will increase melatonin to 5 mg. Family to spend night for support.  16. Chronic constipation-     -had large bm 12/22  -continue current mom and colace in pm 17. GERD- will increase Protonix- used Omeprazole at home.   18.  Odynophagia related to CEA -some crackles at right base on exam. CXR clear  -encourage IS, FV  Placed nursing order to bring him tea with honey daily.  19. Productive cough: continue robitussin 20. Prediabetes: placed nursing order to provide only water, no drinks with added sugar    LOS: 9 days A FACE TO FACE EVALUATION WAS PERFORMED  Clide Deutscher Aitanna Haubner 12/30/2020, 12:25 PM

## 2020-12-30 NOTE — Progress Notes (Signed)
Physical Therapy Session Note  Patient Details  Name: Joel Peck MRN: [id number] Date of Birth: 1935-09-30  Today's Date: 12/30/2020 PT Individual Time: 1500-1525 PT Individual Time Calculation (min): 25 min   Short Term Goals: Week 2:  PT Short Term Goal 1 (Week 2): Pt will complete bed mobility with CGA PT Short Term Goal 2 (Week 2): Pt will complete bed<>chair transfers with CGA and LRAD PT Short Term Goal 3 (Week 2): Pt will ambulate 111ft with CGA and LRAD PT Short Term Goal 4 (Week 2): Pt will navigate 6inch curb with minA and LRAD PT Short Term Goal 5 (Week 2): Family education will be initiated  Skilled Therapeutic Interventions/Progress Updates:     Pt sitting in w/c to start and agreeable to therapy session. No reports of pain. Transported to day room rehab gym for time. Sit<>stand to RW with CGA and requires assist for LUE placement into RW splint. Ambulates ~138ft with minA and RW with improved stability with LLE abduction with foot catching during last ~47ft. Assisted onto Nustep where he completed 7 minutes at workload 5, using BLE only to isolate. Required min cues for keeping L knee in neutral alignment. Pt assisted back to w/c via stand<>pivot transfer and miNA with no AD. Returned to room, remained seated in w/c with chair alarm on, all needs in reach, pt made comfortable.   Therapy Documentation Precautions:  Precautions Precautions: Fall Precaution Comments: L inattention/visual field cut, L hemi Restrictions Weight Bearing Restrictions: No General:   Vital Signs: Therapy Vitals Temp: 97.7 F (36.5 C) Pulse Rate: 74 Resp: 19 BP: 139/62 Patient Position (if appropriate): Sitting Oxygen Therapy SpO2: 95 % O2 Device: Room Air Pain: Pain Assessment Pain Scale: 0-10 Pain Score: 0-No pain Mobility:   Locomotion :    Trunk/Postural Assessment :    Balance:   Exercises:   Other Treatments:      Therapy/Group: Individual Therapy  Jatara Huettner P  Lenix Kidd 12/30/2020, 3:24 PM

## 2020-12-31 MED ORDER — DM-GUAIFENESIN ER 30-600 MG PO TB12
1.0000 | ORAL_TABLET | Freq: Two times a day (BID) | ORAL | Status: DC
  Administered 2020-12-31 – 2021-01-11 (×23): 1 via ORAL
  Filled 2020-12-31 (×24): qty 1

## 2020-12-31 MED ORDER — OXYCODONE-ACETAMINOPHEN 5-325 MG PO TABS: 1.0000 | ORAL_TABLET | Freq: Three times a day (TID) | ORAL | Status: DC | PRN

## 2020-12-31 NOTE — Progress Notes (Signed)
Occupational Therapy Session Note  Patient Details  Name: Joel Peck MRN: [id number] Date of Birth: June 10, 1935  Today's Date: 12/31/2020 OT Individual Time: 1505-1530 OT Individual Time Calculation (min): 25 min    Short Term Goals: Week 2:  OT Short Term Goal 1 (Week 2): pt will complete UB dressing with CGA using hemi techniques as needed OT Short Term Goal 2 (Week 2): pt will complete 2/3 parts of toileting with MIN A OT Short Term Goal 3 (Week 2): pt will complete 1 grooming task in standing with CGA  Skilled Therapeutic Interventions/Progress Updates:  Skilled OT intervention completed with focus on toileting tasks, NMR on LUE. Pt received seated in w/c, with pt's brother in room. Pt agreeable to session. Pt requesting assist to void, with pt sit > stand using RW with hand splint at [facility] for standing and min A for placing L hand on splint. Pt able to doff pants/brief with min A while in stance, then therapist holding urinal in place. Pt able to donn pants over hips with min A, and removed L hand from splint with CGA for balance prior to sitting. Pt continent void episode- RN in room and notified of urine amount. RN providing pt meds. Pt participated in the following for NMR and LUE strengthening to promote functional use of non-dominant side including:  Finger flexion, extension x10 each, against gravity Wrist flexion, extension x10 each in gravity eliminated Elbow flexion, extension x10 each, against gravity Scapular retraction / elevation x10 each  Multimodal cues needed throughout to promote activation and for form throughout. Pt left seated in w/c, with chair alarm on, and all needs in reach at end of session.    Therapy Documentation Precautions:  Precautions Precautions: Fall Precaution Comments: L inattention/visual field cut, L hemi Restrictions Weight Bearing Restrictions: No  Pain: No c/o pain   Therapy/Group: Individual Therapy  Sharnetta Gielow E Brettney Ficken 12/31/2020,  8:00 AM

## 2020-12-31 NOTE — Progress Notes (Signed)
Physical Therapy Session Note  Patient Details  Name: Joel Peck MRN: [id number] Date of Birth: Feb 07, 1935  Today's Date: 12/31/2020 PT Individual Time: 0800-0856 + 1300-1410 PT Individual Time Calculation (min): 56 min  + 70 min  Short Term Goals: Week 2:  PT Short Term Goal 1 (Week 2): Pt will complete bed mobility with CGA PT Short Term Goal 2 (Week 2): Pt will complete bed<>chair transfers with CGA and LRAD PT Short Term Goal 3 (Week 2): Pt will ambulate 164ft with CGA and LRAD PT Short Term Goal 4 (Week 2): Pt will navigate 6inch curb with minA and LRAD PT Short Term Goal 5 (Week 2): Family education will be initiated  Skilled Therapeutic Interventions/Progress Updates:     1st session: Pt presents sitting in w/c, agreeable to PT tx. NO reports of pain. Daughter at bedside. Updated daughter on PT POC, pt's current mobility status, PT goals, and general DC planning. Daughter confirms family education/training is scheduled for next Friday. Pt in hospital gown, requesting assistance for getting dressed for the day. Required maxA for LB dressing and minA for UB dressing with mod cues for hemi technique. Sit<>Stand to RW with minA and CGA while pulling pants up in standing. Donned socks/shoes with totalA for time. Transported to main rehab gym in w/c for time and energy conservation. Completed curb (6inch) training to simulate home entrance. Demonstration by PT prior to ensure understanding and carryover. Completed x4 curb transfers with RW and minA (minA primarily needed for assisting with managing RW) with cues fading from max instructional cues to min instructional cues. Instructed in gait training where he ambulated ~151ft + ~56ft with CGA and RW. Primary gait deficits include L foot slap, L steppage gait, downward gaze, and decreased gait speed for age. Retrieved level 1 theraband to work on power and eccentric strengthening of L ankle DF to have carryover into functional gait to reduce  foot slap. Pt returned to his room at completion of session - remained seated in w/c with chair alarm on and all needs within reach.   2nd session: Pt seen sitting in w/c and agreeable to therapy session. No reports of pain. Transported in w/c to day room rehab gym. Stand<>pivot transfer with RW and minA to Nustep. Assist for setup and completed x5 minutes with workload 6, using BLE only to target LLE strengthening. Nustep completed for active warm up with remainder of session focusing on gait training on LiteGait over treadmill. Harness donned/doffed in standing with CGA for balance.   Trial 1: 7 minutes, no BWS, 0.4-0.7mph, 464ft  Trial 2: 7 minutes, no BWS, 70mph, 480ft. Used Foot-Up brace on LLE  Trial 3: 7 minutes, no BWS, 0.73mph, 465ft. Used AFO Walk On Trimmable on LLE  *VC for increasing L heel strike and efforts for eccentric control on ankle DF to reduce foot slap on L. Foot up brace provided assist for ankle DF during swing phase but continued to show toe catching ~50% of the time. With AFO, toe catching ~30% of the time and some possible hip circumduction to compensate for foot clearance. L knee remains flexed in stance.   Pt assisted back to room in w/c for energy conservation. Remained seated in w/c with all needs in reach, LUE supported in arm trough, chair alarm on.   Therapy Documentation Precautions:  Precautions Precautions: Fall Precaution Comments: L inattention/visual field cut, L hemi Restrictions Weight Bearing Restrictions: No General:    Therapy/Group: Individual Therapy  Alger Simons 12/31/2020,  7:42 AM

## 2020-12-31 NOTE — Progress Notes (Signed)
PROGRESS NOTE   Subjective/Complaints: Patient says cough improved. Heard from overnight that he was asking for Robitussin more frequently than ordered- will schedule BID.  He is planning to shave when his son comes in this morning  ROS: Patient denies fever, rash, sore throat, blurred vision, nausea, vomiting, diarrhea,  shortness of breath or chest pain, joint or back pain, headache, or mood change. +throat discomfort- improved, +cough   Objective:   DG Chest 2 View  Result Date: 12/29/2020 CLINICAL DATA:  Cough EXAM: CHEST - 2 VIEW COMPARISON:  12/22/2020 FINDINGS: Linear atelectasis left base. No consolidation or effusion. Stable cardiomediastinal silhouette with aortic atherosclerosis. Degenerative changes of the spine. IMPRESSION: No active cardiopulmonary disease. Electronically Signed   By: Donavan Foil M.D.   On: 12/29/2020 21:09   No results for input(s): WBC, HGB, HCT, PLT in the last 72 hours.  No results for input(s): NA, K, CL, CO2, GLUCOSE, BUN, CREATININE, CALCIUM in the last 72 hours.   Intake/Output Summary (Last 24 hours) at 12/31/2020 0916 Last data filed at 12/31/2020 0800 Gross per 24 hour  Intake 360 ml  Output 1350 ml  Net -990 ml        Physical Exam: Vital Signs Blood pressure 140/64, pulse 70, temperature 98.4 F (36.9 C), temperature source Oral, resp. rate 18, height 5\' 7"  (1.702 m), weight 77.1 kg, SpO2 96 %.  Gen: no distress, normal appearing HEENT: oral mucosa pink and moist, NCAT Cardio: Reg rate Chest: normal effort, normal rate of breathing Abd: soft, non-distended Ext: no edema Psych: pleasant and cooperative  Skin: Clean and intact without signs of breakdown Neuro: oriented and alert. Reasonable insight and awareness. No CN signs. Left EF and EE 3/5 to 2/5 wrist,HI. LLE 3- HE, KE and 0/5 at ankle. Senses pain and light touch.  Musculoskeletal: normal rom. No jt swelling     Assessment/Plan: 1. Functional deficits which require 3+ hours per day of interdisciplinary therapy in a comprehensive inpatient rehab setting. Physiatrist is providing close team supervision and 24 hour management of active medical problems listed below. Physiatrist and rehab team continue to assess barriers to discharge/monitor patient progress toward functional and medical goals  Care Tool:  Bathing    Body parts bathed by patient: Right lower leg, Left lower leg   Body parts bathed by helper: Left lower leg, Right lower leg, Right arm     Bathing assist Assist Level: Supervision/Verbal cueing     Upper Body Dressing/Undressing Upper body dressing Upper body dressing/undressing activity did not occur (including orthotics): Safety/medical concerns What is the patient wearing?: Pull over shirt    Upper body assist Assist Level: Minimal Assistance - Patient > 75%    Lower Body Dressing/Undressing Lower body dressing    Lower body dressing activity did not occur: Safety/medical concerns What is the patient wearing?: Pants, Incontinence brief     Lower body assist Assist for lower body dressing: Moderate Assistance - Patient 50 - 74%     Toileting Toileting    Toileting assist Assist for toileting: 2 Helpers     Transfers Chair/bed transfer  Transfers assist     Chair/bed transfer assist level: Minimal Assistance -  Patient > 75%     Locomotion Ambulation   Ambulation assist      Assist level: Minimal Assistance - Patient > 75% Assistive device: Walker-rolling Max distance: 18ft   Walk 10 feet activity   Assist  Walk 10 feet activity did not occur: Safety/medical concerns  Assist level: Minimal Assistance - Patient > 75% Assistive device: Walker-rolling   Walk 50 feet activity   Assist Walk 50 feet with 2 turns activity did not occur: Safety/medical concerns  Assist level: Minimal Assistance - Patient > 75% Assistive device:  Walker-rolling    Walk 150 feet activity   Assist Walk 150 feet activity did not occur: Safety/medical concerns  Assist level: Minimal Assistance - Patient > 75% Assistive device: Walker-rolling    Walk 10 feet on uneven surface  activity   Assist Walk 10 feet on uneven surfaces activity did not occur: Safety/medical concerns         Wheelchair     Assist Is the patient using a wheelchair?: Yes Type of Wheelchair: Manual    Wheelchair assist level: Dependent - Patient 0% Max wheelchair distance: 151ft    Wheelchair 50 feet with 2 turns activity    Assist        Assist Level: Dependent - Patient 0%   Wheelchair 150 feet activity     Assist      Assist Level: Dependent - Patient 0%   Blood pressure 140/64, pulse 70, temperature 98.4 F (36.9 C), temperature source Oral, resp. rate 18, height 5\' 7"  (1.702 m), weight 77.1 kg, SpO2 96 %.  Medical Problem List and Plan: 1. 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  Impaired mobility, ambulating 165 feet: may discontinue Lovenox, clear to shave.              -antiplatelet therapy: DAPT X 3 months followed by ASA alone.  3. Pain: Decrease oxycodone to 1 tab q8H prn  4. Insomnia: continue trazodone 75mg  HS               -antipsychotic agents: N/A    -continue to provide egosupport and positive reinforcement 5. Neuropsych: This patient is capable of making decisions on his own behalf. 6. Skin/Wound Care: Routine pressure relief measures.              --monitor right neck incision for healing/edema.  7. Fluids/Electrolytes/Nutrition: Monitor I/O. Check  lytes in am.  8. HTN: Monitor BP TID with SBP goals 110-120 to avoid hypoperfusion.              --On Norvasc and metoprolol BID.        Vitals:    12/21/20 1959 12/22/20 0456  BP: (!) 140/59 128/61  Pulse: 68 66  Resp: 18 18  Temp: 98.4 F (36.9 C) 98.4 F (36.9 C)  SpO2: 98% 97%  Controlled 12/26   9. AKI: SCr has risen from 1.03-->1.30. Likely due to dye/hypotension             --Encourage fluid  intake. 12/21 recheck SCr improved to 0.9 10. Leucocytosis: Likely reactive due to surgery. Will monitor for signs of infection 11. ABLA: Likely due to thrombolytics/surgery with drop in  Hgb 14.3--->10.9---> 11.3 on 12/21 --Will monitor with serial CBC for trend as on DAPT/Lovenox  12. Hyperlipidemia: LDL 111. Continue Crestor.  13. BPH: Continue Flomax. Will check PVRs as reporting hesitancy.  14. Post CEA odynophagia/dysphagia: He is ordering appropriate foods. Will monitor for now.  --he does not want diet adjusted at this time 15. Insomnia: Will increase melatonin to 5 mg. Family to spend night for support.  16. Chronic constipation-     -had large bm 12/22  -continue current mom and colace in pm 17. GERD- will increase Protonix- used Omeprazole at home.   18.  Odynophagia related to CEA -some crackles at right base on exam. CXR clear  -encourage IS, FV  Placed nursing order to bring him tea with honey daily.  19. Productive cough: continue robitussin. Add mucinex q12H 20. Prediabetes: placed nursing order to provide only water, no drinks with added sugar     LOS: 10 days A FACE TO FACE EVALUATION WAS PERFORMED  Clide Deutscher Miklos Bidinger 12/31/2020, 9:16 AM

## 2020-12-31 NOTE — Progress Notes (Signed)
Occupational Therapy Session Note  Patient Details  Name: Joel Peck MRN: [id number] Date of Birth: 03/29/1935  Today's Date: 12/31/2020 OT Individual Time: 1120-1203 OT Individual Time Calculation (min): 43 min    Short Term Goals: Week 2:  OT Short Term Goal 1 (Week 2): pt will complete UB dressing with CGA using hemi techniques as needed OT Short Term Goal 2 (Week 2): pt will complete 2/3 parts of toileting with MIN A OT Short Term Goal 3 (Week 2): pt will complete 1 grooming task in standing with CGA  Skilled Therapeutic Interventions/Progress Updates:    Pt greeted seated in wc and agreeable to OT treatment session. Pt wanted to wash lower body at the sink. Worked on sit<>stands at the sink with weight bearing through L hand at the to promote weight bearing. Pt needed min A for balance with cues for weight shifting and L LE activation. Pt was able to wash peri-area and buttom in standing. Practiced LB dressing from wc with min A to thread pant legs, then min A for balance to pull up pants. L UE NMR with weight bearing through L wrist and elbow to bring pt through full elbow and shoulder ROM. Pt with good activation throughout. Isolated pronation/supination and finger flex/ext. More diffiuclty extending fingers. Pt left seated in wc at end of session with alarm belt on, call bell in reach, and needs met.   Therapy Documentation Precautions:  Precautions Precautions: Fall Precaution Comments: L inattention/visual field cut, L hemi Restrictions Weight Bearing Restrictions: No Pain: Pain Assessment Pain Scale: 0-10 Pain Score: 0-No pain   Therapy/Group: Individual Therapy  Valma Cava 12/31/2020, 12:21 PM

## 2021-01-01 MED ORDER — OXYCODONE-ACETAMINOPHEN 5-325 MG PO TABS: 1.0000 | ORAL_TABLET | Freq: Two times a day (BID) | ORAL | Status: DC | PRN

## 2021-01-01 NOTE — Progress Notes (Signed)
PROGRESS NOTE   Subjective/Complaints: Cough improved Sister visiting today Asks about outpatient therapy plan Was happy his son was able to shave him yesterday  ROS: Patient denies fever, rash, sore throat, blurred vision, nausea, vomiting, diarrhea,  shortness of breath or chest pain, joint or back pain, headache, or mood change. +throat discomfort- improved, +cough   Objective:   No results found. No results for input(s): WBC, HGB, HCT, PLT in the last 72 hours.  No results for input(s): NA, K, CL, CO2, GLUCOSE, BUN, CREATININE, CALCIUM in the last 72 hours.   Intake/Output Summary (Last 24 hours) at 01/01/2021 1404 Last data filed at 01/01/2021 1358 Gross per 24 hour  Intake 598 ml  Output 1320 ml  Net -722 ml        Physical Exam: Vital Signs Blood pressure 139/66, pulse 72, temperature 98.2 F (36.8 C), temperature source Oral, resp. rate 15, height 5\' 7"  (1.702 m), weight 77.1 kg, SpO2 98 %. Gen: no distress, normal appearing HEENT: oral mucosa pink and moist, NCAT Cardio: Reg rate Chest: normal effort, normal rate of breathing Abd: soft, non-distended Ext: no edema Psych: pleasant and cooperative  Skin: Clean and intact without signs of breakdown Neuro: oriented and alert. Reasonable insight and awareness. No CN signs. Left EF and EE 3/5 to 2/5 wrist,HI. LLE 3- HE, KE and 0/5 at ankle. Senses pain and light touch.  Musculoskeletal: normal rom. No jt swelling    Assessment/Plan: 1. Functional deficits which require 3+ hours per day of interdisciplinary therapy in a comprehensive inpatient rehab setting. Physiatrist is providing close team supervision and 24 hour management of active medical problems listed below. Physiatrist and rehab team continue to assess barriers to discharge/monitor patient progress toward functional and medical goals  Care Tool:  Bathing    Body parts bathed by patient: Right  lower leg, Left lower leg   Body parts bathed by helper: Left lower leg, Right lower leg, Right arm     Bathing assist Assist Level: Supervision/Verbal cueing     Upper Body Dressing/Undressing Upper body dressing Upper body dressing/undressing activity did not occur (including orthotics): Safety/medical concerns What is the patient wearing?: Pull over shirt    Upper body assist Assist Level: Minimal Assistance - Patient > 75%    Lower Body Dressing/Undressing Lower body dressing    Lower body dressing activity did not occur: Safety/medical concerns What is the patient wearing?: Pants, Incontinence brief     Lower body assist Assist for lower body dressing: Moderate Assistance - Patient 50 - 74%     Toileting Toileting    Toileting assist Assist for toileting: Minimal Assistance - Patient > 75%     Transfers Chair/bed transfer  Transfers assist     Chair/bed transfer assist level: Minimal Assistance - Patient > 75%     Locomotion Ambulation   Ambulation assist      Assist level: Minimal Assistance - Patient > 75% Assistive device: Walker-rolling Max distance: 125ft   Walk 10 feet activity   Assist  Walk 10 feet activity did not occur: Safety/medical concerns  Assist level: Minimal Assistance - Patient > 75% Assistive device: Walker-rolling   Walk 50  feet activity   Assist Walk 50 feet with 2 turns activity did not occur: Safety/medical concerns  Assist level: Minimal Assistance - Patient > 75% Assistive device: Walker-rolling    Walk 150 feet activity   Assist Walk 150 feet activity did not occur: Safety/medical concerns  Assist level: Minimal Assistance - Patient > 75% Assistive device: Walker-rolling    Walk 10 feet on uneven surface  activity   Assist Walk 10 feet on uneven surfaces activity did not occur: Safety/medical concerns         Wheelchair     Assist Is the patient using a wheelchair?: Yes Type of Wheelchair:  Manual    Wheelchair assist level: Dependent - Patient 0% Max wheelchair distance: 152ft    Wheelchair 50 feet with 2 turns activity    Assist        Assist Level: Dependent - Patient 0%   Wheelchair 150 feet activity     Assist      Assist Level: Dependent - Patient 0%   Blood pressure 139/66, pulse 72, temperature 98.2 F (36.8 C), temperature source Oral, resp. rate 15, height 5\' 7"  (1.702 m), weight 77.1 kg, SpO2 98 %.  Medical Problem List and Plan: 1. 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   Commended on progress with therapy  Offered grounds pass but he defers at this time 2.  Impaired mobility, ambulating 165 feet: may discontinue Lovenox, clear to shave.              -antiplatelet therapy: DAPT X 3 months followed by ASA alone.  3. Pain: Decrease oxycodone to 1 tab q12H prn  4. Insomnia: continue trazodone 75mg  HS               -antipsychotic agents: N/A    -continue to provide egosupport and positive reinforcement 5. Neuropsych: This patient is capable of making decisions on his own behalf. 6. Skin/Wound Care: Routine pressure relief measures.              --monitor right neck incision for healing/edema.  7. Fluids/Electrolytes/Nutrition: Monitor I/O. Check lytes in am.  8. HTN: Monitor BP TID with SBP goals 110-120 to avoid hypoperfusion.              --On Norvasc and metoprolol BID.        Vitals:    12/21/20 1959 12/22/20 0456  BP: (!) 140/59 128/61  Pulse: 68 66  Resp: 18 18  Temp: 98.4 F (36.9 C) 98.4 F (36.9 C)  SpO2: 98% 97%  Controlled 12/26   9. AKI: SCr has risen from 1.03-->1.30. Likely due to dye/hypotension             --Encourage fluid intake. 12/21 recheck SCr  improved to 0.9 10. Leucocytosis: Likely reactive due to surgery. Will monitor for signs of infection 11. ABLA: Likely due to thrombolytics/surgery with drop in  Hgb 14.3--->10.9---> 11.3 on 12/21 --Will monitor with serial CBC for trend as on DAPT/Lovenox  12. Hyperlipidemia: LDL 111. Continue Crestor.  13. BPH: Continue Flomax. Will check PVRs as reporting hesitancy.  14. Post CEA odynophagia/dysphagia: He  is ordering appropriate foods. Will monitor for now.  --he does not want diet adjusted at this time 15. Insomnia: Will increase melatonin to 5 mg. Family to spend night for support.  16. Chronic constipation-     -had large bm 12/22  -continue current mom and colace in pm 17. GERD- will increase Protonix- used Omeprazole at home.   18.  Odynophagia related to CEA -some crackles at right base on exam. CXR clear  -encourage IS, FV  Placed nursing order to bring him tea with honey daily.  19. Productive cough: continue robitussin. Add mucinex q12H 20. Prediabetes: placed nursing order to provide only water, no drinks with added sugar     LOS: 11 days A FACE TO FACE EVALUATION WAS PERFORMED  Shereda Graw P Tyaire Odem 01/01/2021, 2:04 PM

## 2021-01-02 NOTE — Progress Notes (Signed)
Physical Therapy Session Note  Patient Details  Name: Joel Peck MRN: [id number] Date of Birth: 11/16/1935  Today's Date: 01/02/2021 PT Individual Time: 0900-0957 PT Individual Time Calculation (min): 57 min   Short Term Goals: Week 2:  PT Short Term Goal 1 (Week 2): Pt will complete bed mobility with CGA PT Short Term Goal 2 (Week 2): Pt will complete bed<>chair transfers with CGA and LRAD PT Short Term Goal 3 (Week 2): Pt will ambulate 160ft with CGA and LRAD PT Short Term Goal 4 (Week 2): Pt will navigate 6inch curb with minA and LRAD PT Short Term Goal 5 (Week 2): Family education will be initiated  Skilled Therapeutic Interventions/Progress Updates:     Pt sitting in w/c to start - agreeable wtihout reports of pain. Requests assistance in getting dressed and out of the hospital gown. Completed LB dressing with modA and UB dressing with minA with VC needed for hemi technique. Noted LLE swelling in ankle and dorsal foot. No redness, tenderness, or pain with palpations - provided compression socks to assist with management and notified LPN of findings. Donned L AFO (ottobock walk on) and shoes with totalA for time. Pt requesting to use bathroom. Ambulated with CGA and RW to toilet, required minA for controlled lowering to low seat. Continent of bladder - charted in flowsheets. He required modA for standing from low toilet height to the RW. Discussed using a 3-1 BSC over his toilet at home to help with standing and sitting on toilet. Ambulated back to his w/c with CGA and RW and then transported to rehab gym for time. Retrieved ELR for his LLE while he sits in his w/c to assist with edema management. Provided pillows as well to help raise and support. Instructed in functional gait training where he ambulated 271ft with CGA and RW - demonstrated improved L foot clearance, reduced foot slap, but does demonstrate some mild vaulting on RLE to compensate. VC for gait corrections and safety. Pt  transported back to his room for time. Remained seated in w/c with LLE support for edema control. Chair alarm on and all needs within reach.   Therapy Documentation Precautions:  Precautions Precautions: Fall Precaution Comments: L inattention/visual field cut, L hemi Restrictions Weight Bearing Restrictions: No General:    Therapy/Group: Individual Therapy  Alger Simons 01/02/2021, 7:35 AM

## 2021-01-03 LAB — BASIC METABOLIC PANEL
Anion gap: 9 (ref 5–15)
BUN: 11 mg/dL (ref 8–23)
CO2: 27 mmol/L (ref 22–32)
Calcium: 9.1 mg/dL (ref 8.9–10.3)
Chloride: 101 mmol/L (ref 98–111)
Creatinine, Ser: 0.94 mg/dL (ref 0.61–1.24)
GFR, Estimated: >60 mL/min (ref >60)
Glucose, Bld: 103 mg/dL — ABNORMAL HIGH (ref 70–99)
Potassium: 4 mmol/L (ref 3.5–5.1)
Sodium: 137 mmol/L (ref 135–145)

## 2021-01-03 NOTE — Progress Notes (Signed)
Occupational Therapy Session Note  Patient Details  Name: Joel Peck MRN: [id number] Date of Birth: 27-Apr-1935  Today's Date: 01/03/2021 OT Individual Time: 1130-1230 OT Individual Time Calculation (min): 60 min    Short Term Goals: Week 2:  OT Short Term Goal 1 (Week 2): pt will complete UB dressing with CGA using hemi techniques as needed OT Short Term Goal 2 (Week 2): pt will complete 2/3 parts of toileting with MIN A OT Short Term Goal 3 (Week 2): pt will complete 1 grooming task in standing with CGA  Skilled Therapeutic Interventions/Progress Updates:    Pt sitting up in w/c, no c/o pain, agreeable to bathing at shower level. Sit to stand and ambulation using RW with CGA to min assist at times.  Pt completed shower transfer and doffed shirt with sup and pants, brief with min assist and education on hemitechique to thread over left foot using RLE assist.  Pt bathed UB with supervision, and LB with min assist somewhat due to tight space of shower having difficulty achieving figure 4 to wash feet.  Pt completed stand pivot using grab bar with min assist and ambulated to EOB using RW with CGA and min assist during tight space negotiation.  Pt donned shirt needing visual demo and min assist to implement hemistrategies (pt educated on setup of shirt prior to donning to avoid sleeves getting tangled)  and donned brief with mod assist and pants with min assist to thread initially over LLE. Stand pivot to w/c with CGA using RW and setup with lunch tray. Call bell in reach, seat alarm on.  Pt would benefit from further training on hemi strategies.  Therapy Documentation Precautions:  Precautions Precautions: Fall Precaution Comments: L inattention/visual field cut, L hemi Restrictions Weight Bearing Restrictions: No    Therapy/Group: Individual Therapy  Ezekiel Slocumb 01/03/2021, 1:40 PM

## 2021-01-03 NOTE — Progress Notes (Signed)
Physical Therapy Session Note  Patient Details  Name: Joel Peck MRN: [id number] Date of Birth: 1935/01/29  Today's Date: 01/03/2021 PT Individual Time: 0900-0955 PT Individual Time Calculation (min): 55 min    Short Term Goals: Week 2:  PT Short Term Goal 1 (Week 2): Pt will complete bed mobility with CGA PT Short Term Goal 2 (Week 2): Pt will complete bed<>chair transfers with CGA and LRAD PT Short Term Goal 3 (Week 2): Pt will ambulate 138ft with CGA and LRAD PT Short Term Goal 4 (Week 2): Pt will navigate 6inch curb with minA and LRAD PT Short Term Goal 5 (Week 2): Family education will be initiated  Skilled Therapeutic Interventions/Progress Updates:      Pt sitting in w/c to start session. Agreeable to PT tx and denies pain. Reports improving in LLE swelling. Assisted in LB dressing where he required modA for donning pants and totalA for donning compression socks/AFO/shoes. Sit<>stand to RW from w/c with minA with mod cues for hand placement and monitoring L foot placement. MinA for balance while pulling pants over hips. LPN then entering for morning rx. Pt transported to day room rehab gym for time and energy conservation. Focused remainder of session on gait training with LiteGait over treadmill. Harness donned/doffed in standing with CGA for balance. minA for balance while stepping on/off treadmill. Completed the following trials:  Trial 1) Forward walking, BUE support, no BWS support, 21min 47 seconds, 1.0 mph, 461ft  Trial 2) Side stepping to L, BUE support, no BWS support, 49min 30 seconds, 0.52mph, 57ft  Trial 3) Side stepping to R, BUE support, no BWS support, 76min 30 seconds, 0.75mph, 91ft  *VC for reducing compensation techniques (mild L circumduction and R vaulting), and increasing L knee flexion in swing to assist with foot clearance. Increased difficulty side stepping L > R but responds well to VC for increasing step length and activating hip abductors to reduce  trendelenburg.    Pt returned to his room and remained seated in w/c with chair alarm on and all needs within reach. Removed L AFO due to c/o tightness on dorsum of foot. Reached out to scheduling for AFO consult with [facility] to assess bracing needs.  Therapy Documentation Precautions:  Precautions Precautions: Fall Precaution Comments: L inattention/visual field cut, L hemi Restrictions Weight Bearing Restrictions: No General:    Therapy/Group: Individual Therapy  Alger Simons 01/03/2021, 9:51 AM

## 2021-01-03 NOTE — Progress Notes (Signed)
Physical Therapy Session Note  Patient Details  Name: Joel Peck MRN: [id number] Date of Birth: 06-25-1935  Today's Date: 01/03/2021 PT Individual Time: 1420-1500 PT Individual Time Calculation (min): 40 min   Short Term Goals: Week 2:  PT Short Term Goal 1 (Week 2): Pt will complete bed mobility with CGA PT Short Term Goal 2 (Week 2): Pt will complete bed<>chair transfers with CGA and LRAD PT Short Term Goal 3 (Week 2): Pt will ambulate 185ft with CGA and LRAD PT Short Term Goal 4 (Week 2): Pt will navigate 6inch curb with minA and LRAD PT Short Term Goal 5 (Week 2): Family education will be initiated  Skilled Therapeutic Interventions/Progress Updates:    Pt received seated in w/c in room, agreeable to PT session. No complaints of pain. Sit to stand with CGA to RW during session with some assist needed for LUE placement in hand orthosis. Trial ambulation x 150 ft with no AFO and RW with CGA. Pt initially exhibits some catching of LLE on the floor but progresses to improved limb clearance with some steppage gait noted. Trial ambulation x 150 ft with use of L PLF AFO with RW and CGA for balance. Pt exhibits increase in steppage with LLE during gait with use of AFO as well as reports discomfort while wearing due to tightness within his shoe. Removed AFO for remainder of session. Standing heel lifts x 20 reps with use of RW and CGA for balance, focus on eccentric control when lowering to the floor. Sidesteps L/R with RW and min A for balance for hip strengthening. Pt exhibits onset of fatigue with sidesteps to the R with increased difficulty lifting LLE to take a step. Pt left seated in w/c in room with needs in reach, chair alarm in place at end of session.  Therapy Documentation Precautions:  Precautions Precautions: Fall Precaution Comments: L inattention/visual field cut, L hemi Restrictions Weight Bearing Restrictions: No     Therapy/Group: Individual Therapy   Excell Seltzer,  PT, DPT, CSRS  01/03/2021, 5:26 PM

## 2021-01-03 NOTE — Progress Notes (Signed)
Physical Therapy Session Note  Patient Details  Name: Joel Peck MRN: [id number] Date of Birth: 11-24-1935  Today's Date: 01/03/2021 PT Individual Time: 1301-1359 PT Individual Time Calculation (min): 58 min   Short Term Goals: Week 1:  PT Short Term Goal 1 (Week 1): Pt will complete bed mobility with minA PT Short Term Goal 1 - Progress (Week 1): Met PT Short Term Goal 2 (Week 1): Pt will complete bed<>chair transfers with modA PT Short Term Goal 2 - Progress (Week 1): Met PT Short Term Goal 3 (Week 1): Pt will ambulate 96f with modA and LRAD PT Short Term Goal 3 - Progress (Week 1): Met PT Short Term Goal 4 (Week 1): Pt will demonstrate improved L sided awareness PT Short Term Goal 4 - Progress (Week 1): Met Week 2:  PT Short Term Goal 1 (Week 2): Pt will complete bed mobility with CGA PT Short Term Goal 2 (Week 2): Pt will complete bed<>chair transfers with CGA and LRAD PT Short Term Goal 3 (Week 2): Pt will ambulate 153fwith CGA and LRAD PT Short Term Goal 4 (Week 2): Pt will navigate 6inch curb with minA and LRAD PT Short Term Goal 5 (Week 2): Family education will be initiated  Skilled Therapeutic Interventions/Progress Updates:  Pt received seated in WC in room, finishing lunch. Pt denied pain and was agreeable to PT. Emphasis of session on gait training and L NMR. Donned ted hose and shoes w/total A and pt performed sit <> stands from WC to RW w/S* throughout session, demonstrated ability to manage L hand in RW grip. Pt ambulated >600' w/CGA and no AFO, short seated rest break in middle. Pt demonstrated adequate step clearance of LLE, mild trend towards steppage gait as he fatigued, and IC w/heel of LLE. Min verbal cues to maintain forward gaze to reduce looking down at feet during ambulation. Noted pt takes very sharp turns and steps out of RW, min visual cues for maintaining safe distance to walker w/turns and pt verbalized understanding. No carryover noted during gait.    Alt. Toe taps to 6" box w/BUE support on RW and CGA for improved step clearance of LLE, BLE coordination, imitation of home environment and single leg stability. Pt performed x20 taps per side and demonstrated adequate step clearance of LLE, no foot drop observed. Noted pt began to slide LLE off of step to lower it as he fatigued, which he corrected w/verbal cues.    Pt transported back to room w/total A 2/2 fatigue and was left seated in WC in room, all needs in reach.    Therapy Documentation Precautions:  Precautions Precautions: Fall Precaution Comments: L inattention/visual field cut, L hemi Restrictions Weight Bearing Restrictions: No    Therapy/Group: Individual Therapy JaCruzita Ledererlaster, PT, DPT  01/03/2021, 7:56 AM

## 2021-01-03 NOTE — Progress Notes (Signed)
PROGRESS NOTE   Subjective/Complaints: Pt reports LBM this AM- "not empty yet".  Also reports LLE swollen-   ROS:  Pt denies SOB, abd pain, CP, N/V/C/D, and vision changes  Objective:   No results found. No results for input(s): WBC, HGB, HCT, PLT in the last 72 hours.  Recent Labs    01/03/21 0511  NA 137  K 4.0  CL 101  CO2 27  GLUCOSE 103*  BUN 11  CREATININE 0.94  CALCIUM 9.1     Intake/Output Summary (Last 24 hours) at 01/03/2021 0946 Last data filed at 01/03/2021 0754 Gross per 24 hour  Intake 960 ml  Output 1050 ml  Net -90 ml        Physical Exam: Vital Signs Blood pressure (!) 140/47, pulse 61, temperature 98.1 F (36.7 C), temperature source Oral, resp. rate 18, height 5\' 7"  (1.702 m), weight 77.1 kg, SpO2 97 %.    General: awake, alert, appropriate, sitting up in bedside chair; NAD HENT: conjugate gaze; oropharynx moist CV: regular rate; no JVD Pulmonary: CTA B/L; no W/R/R- good air movement GI: soft, NT, ND, (+)BS Psychiatric: appropriate Neurological: alert Psych: pleasant and cooperative  Skin: Clean and intact without signs of breakdown Neuro: oriented and alert. Reasonable insight and awareness. No CN signs. Left EF and EE 3/5 to 2/5 wrist,HI. LLE 3- HE, KE and 0/5 at ankle. Senses pain and light touch.  Musculoskeletal: normal rom. L foot 1+ edema- mild  Assessment/Plan: 1. Functional deficits which require 3+ hours per day of interdisciplinary therapy in a comprehensive inpatient rehab setting. Physiatrist is providing close team supervision and 24 hour management of active medical problems listed below. Physiatrist and rehab team continue to assess barriers to discharge/monitor patient progress toward functional and medical goals  Care Tool:  Bathing    Body parts bathed by patient: Right lower leg, Left lower leg   Body parts bathed by helper: Left lower leg, Right lower leg,  Right arm     Bathing assist Assist Level: Supervision/Verbal cueing     Upper Body Dressing/Undressing Upper body dressing Upper body dressing/undressing activity did not occur (including orthotics): Safety/medical concerns What is the patient wearing?: Pull over shirt    Upper body assist Assist Level: Minimal Assistance - Patient > 75%    Lower Body Dressing/Undressing Lower body dressing    Lower body dressing activity did not occur: Safety/medical concerns What is the patient wearing?: Pants, Incontinence brief     Lower body assist Assist for lower body dressing: Moderate Assistance - Patient 50 - 74%     Toileting Toileting    Toileting assist Assist for toileting: Minimal Assistance - Patient > 75%     Transfers Chair/bed transfer  Transfers assist     Chair/bed transfer assist level: Minimal Assistance - Patient > 75%     Locomotion Ambulation   Ambulation assist      Assist level: Minimal Assistance - Patient > 75% Assistive device: Walker-rolling Max distance: 162ft   Walk 10 feet activity   Assist  Walk 10 feet activity did not occur: Safety/medical concerns  Assist level: Minimal Assistance - Patient > 75% Assistive device: Walker-rolling  Walk 50 feet activity   Assist Walk 50 feet with 2 turns activity did not occur: Safety/medical concerns  Assist level: Minimal Assistance - Patient > 75% Assistive device: Walker-rolling    Walk 150 feet activity   Assist Walk 150 feet activity did not occur: Safety/medical concerns  Assist level: Minimal Assistance - Patient > 75% Assistive device: Walker-rolling    Walk 10 feet on uneven surface  activity   Assist Walk 10 feet on uneven surfaces activity did not occur: Safety/medical concerns         Wheelchair     Assist Is the patient using a wheelchair?: Yes Type of Wheelchair: Manual    Wheelchair assist level: Dependent - Patient 0% Max wheelchair distance: 156ft     Wheelchair 50 feet with 2 turns activity    Assist        Assist Level: Dependent - Patient 0%   Wheelchair 150 feet activity     Assist      Assist Level: Dependent - Patient 0%   Blood pressure (!) 140/47, pulse 61, temperature 98.1 F (36.7 C), temperature source Oral, resp. rate 18, height 5\' 7"  (1.702 m), weight 77.1 kg, SpO2 97 %.  Medical Problem List and Plan: 1. 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  Impaired mobility, ambulating 165 feet: may discontinue Lovenox, clear to shave.              -antiplatelet therapy: DAPT X 3 months followed by ASA alone.  3. Pain: Decrease oxycodone to 1 tab q12H prn  4. Insomnia: continue trazodone 75mg  HS               -antipsychotic agents: N/A    -continue to provide egosupport and positive reinforcement 5. Neuropsych: This patient is capable of making decisions on his own behalf. 6. Skin/Wound Care: Routine pressure relief measures.              --monitor right neck incision for healing/edema.  7. Fluids/Electrolytes/Nutrition: Monitor I/O. Check lytes in am.  8. HTN: Monitor BP TID with SBP goals 110-120 to avoid hypoperfusion.              --On Norvasc and metoprolol BID.        Vitals:    12/21/20 1959 12/22/20 0456  BP: (!) 140/59 128/61  Pulse: 68 66  Resp: 18 18  Temp: 98.4 F (36.9 C) 98.4 F (36.9 C)  SpO2: 98% 97%  1/2- BP controlled- con't regimen 9. AKI: SCr has risen from 1.03-->1.30. Likely due to dye/hypotension             --Encourage fluid intake. 12/21 recheck SCr improved to 0.9 10. Leucocytosis: Likely reactive due to surgery. Will monitor for signs of infection 11. ABLA: Likely due to thrombolytics/surgery with drop in  Hgb 14.3--->10.9---> 11.3 on 12/21 --Will monitor with  serial CBC for trend as on DAPT/Lovenox  12. Hyperlipidemia: LDL 111. Continue Crestor.  13. BPH: Continue Flomax. Will check PVRs as reporting hesitancy.  14. Post CEA odynophagia/dysphagia: He is ordering appropriate foods. Will monitor for now.  --he does not want diet adjusted at this time 15. Insomnia: Will  increase melatonin to 5 mg. Family to spend night for support.  16. Chronic constipation-     -had large bm 12/22  -continue current mom and colace in pm  1/2- LBM this AM- "not finished". Needs to go again.  17. GERD- will increase Protonix- used Omeprazole at home.   18.  Odynophagia related to CEA -some crackles at right base on exam. CXR clear  -encourage IS, FV  Placed nursing order to bring him tea with honey daily.  19. Productive cough: continue robitussin. Add mucinex q12H 20. Prediabetes: placed nursing order to provide only water, no drinks with added sugar     LOS: 13 days A FACE TO FACE EVALUATION WAS PERFORMED  Joel Peck 01/03/2021, 9:46 AM

## 2021-01-04 MED ORDER — OXYCODONE-ACETAMINOPHEN 5-325 MG PO TABS: 1.0000 | ORAL_TABLET | Freq: Every day | ORAL | Status: DC | PRN

## 2021-01-04 MED ORDER — GABAPENTIN 100 MG PO CAPS
100.0000 mg | ORAL_CAPSULE | Freq: Every evening | ORAL | Status: DC | PRN
  Administered 2021-01-05 – 2021-01-10 (×3): 100 mg via ORAL
  Filled 2021-01-04 (×3): qty 1

## 2021-01-04 NOTE — Progress Notes (Signed)
Physical Therapy Session Note  Patient Details  Name: Joel Peck MRN: [id number] Date of Birth: Apr 01, 1935  Today's Date: 01/04/2021 PT Individual Time: 1430-1458 PT Individual Time Calculation (min): 28 min   Short Term Goals: Week 1:  PT Short Term Goal 1 (Week 1): Pt will complete bed mobility with minA PT Short Term Goal 1 - Progress (Week 1): Met PT Short Term Goal 2 (Week 1): Pt will complete bed<>chair transfers with modA PT Short Term Goal 2 - Progress (Week 1): Met PT Short Term Goal 3 (Week 1): Pt will ambulate 57f with modA and LRAD PT Short Term Goal 3 - Progress (Week 1): Met PT Short Term Goal 4 (Week 1): Pt will demonstrate improved L sided awareness PT Short Term Goal 4 - Progress (Week 1): Met Week 2:  PT Short Term Goal 1 (Week 2): Pt will complete bed mobility with CGA PT Short Term Goal 2 (Week 2): Pt will complete bed<>chair transfers with CGA and LRAD PT Short Term Goal 3 (Week 2): Pt will ambulate 1532fwith CGA and LRAD PT Short Term Goal 4 (Week 2): Pt will navigate 6inch curb with minA and LRAD PT Short Term Goal 5 (Week 2): Family education will be initiated  Skilled Therapeutic Interventions/Progress Updates:  Pt received seated in WC in room, denied pain and agreeable to PT. Emphasis of session on gait training and L NMR. Donned shoes and L AFO w/total A and pt performed sit <>stand from [facility]/S*, good management of LUE on RW brace. Pt ambulated >300' x2 w/L AFO on and RW w/S*, noted slight circumduction of LLE during swing, R lateral trunk lean w/R shoulder shrug and decreased knee flexion from midswing to IC. Min verbal cues to improve knee flexion during swing phase of LLE, which pt able to correct until fatigued. Pt was left seated in WC in room, LUE propped on pillow, all needs in reach.   Therapy Documentation Precautions:  Precautions Precautions: Fall Precaution Comments: L inattention/visual field cut, L hemi Restrictions Weight Bearing  Restrictions: No  Therapy/Group: Individual Therapy JaCruzita Ledererlaster, PT, DPT  01/04/2021, 7:55 AM

## 2021-01-04 NOTE — Progress Notes (Signed)
Patient resting well throughout the night. Remains continent of B/B. Uses urinal while in bed. Denies any pain or discomfort.

## 2021-01-04 NOTE — Progress Notes (Signed)
Physical Therapy Session Note  Patient Details  Name: Joel Peck MRN: [id number] Date of Birth: 1935-03-31  Today's Date: 01/04/2021 PT Individual Time: 1000-1040 + 1300-1355 PT Individual Time Calculation (min): 40 min  + 55 min  Short Term Goals: Week 2:  PT Short Term Goal 1 (Week 2): Pt will complete bed mobility with CGA PT Short Term Goal 2 (Week 2): Pt will complete bed<>chair transfers with CGA and LRAD PT Short Term Goal 3 (Week 2): Pt will ambulate 139ft with CGA and LRAD PT Short Term Goal 4 (Week 2): Pt will navigate 6inch curb with minA and LRAD PT Short Term Goal 5 (Week 2): Family education will be initiated  Skilled Therapeutic Interventions/Progress Updates:     1st session: Pt presents sitting in w/c to start session - agreeable to PT tx without reports of pain. Transported pt to main rehab gym for time. Stand<>pivot with RW and CGA from w/c to mat table. Pt instructed in gait training within rehab hallways. With AFO ON, ambulated ~200t with close supervision (!) and RW with CGA needed for turns for safety. Removed AFO to trial and pt ambulated same distance and route, requiring CGA and RW. Without brace, pt demo's some LLE adduction, intermittent toe catching on L, and some inconsistent step lengths. Benefited from brace to assist with managing these deficits. Pt assisted onto mat table with supervision assist and focused remainder of session on LLE stretching due to very tight hamstrings and hip rotators. Provided PROM stretching with 45sec holds for proximal/distal hamstrings, heel cord, hip abductors, and hip rotators on L side. Will provide HEP for stretches at later date. Pt assisted back to w/c via stand<>pivot transfer with CGA and no AD. Transported back to his room for time and remained seated in w/c with chair alarm on, LUE propped with pillows, all needs within reach.  2nd session: Pt received sitting in w/c, agreeable to PT tx. Denies pain. Transported in w/c to  main rehab gym for time.   Focus of session to complete AFO consult in collaboration with [facility] to assist with determining bracing needs at DC. Decision made on [place] Walk-on Trimmable AFO with toe cap applied to shoe. Pt will need 1/2 size larger shoe to accommodate for edema and AFO fitting.  Pt instructed in stand<>pivot transfer from w/c to mat table, requiring CGA and RW. Ambulated within rehab gym, ~34ft with CGA and RW with his AFO on L foot. Primary gait deficits include L steppage, limited L heel strike, and L toe catching with fatigue. Progressed to ambulating with no AD to challenge balance and gait - ambulated ~115ft + `113ft with minA. With no AD, demonstrates increased lateral instability, inconsistent step lengths on L, and reduced gait speed.   In ADL apartment, worked on furniture transfers from low sitting sofa couch and recliner - required minA for completing sit<>stands with min instructional cues to increase forward weight shift and scooting to edge of chair to assist with transfer. Worked on bed mobility in regular flat bed, requiring minA for LLE management. Blocked practice sit>supine transfers which improved with log rolling technique and incorporating stronger RUE to assist with trunk support to upright.  Pt transported back to his room. Remained seated in w/c with chair alarm on and all immediate needs within reach.  Therapy Documentation Precautions:  Precautions Precautions: Fall Precaution Comments: L inattention/visual field cut, L hemi Restrictions Weight Bearing Restrictions: No General:     Therapy/Group: Individual Therapy  Brexlee Heberlein P Hashem Goynes 01/04/2021, 7:45 AM

## 2021-01-04 NOTE — Progress Notes (Signed)
Occupational Therapy Session Note  Patient Details  Name: Joel Peck MRN: [id number] Date of Birth: Jun 11, 1935  Today's Date: 01/04/2021 OT Individual Time: 0900-1000 OT Individual Time Calculation (min): 60 min    Short Term Goals: Week 2:  OT Short Term Goal 1 (Week 2): pt will complete UB dressing with CGA using hemi techniques as needed OT Short Term Goal 2 (Week 2): pt will complete 2/3 parts of toileting with MIN A OT Short Term Goal 3 (Week 2): pt will complete 1 grooming task in standing with CGA   Skilled Therapeutic Interventions/Progress Updates:    Pt sitting up in w/c, gown donned and daughter and nursing just leaving room.  Pt with no c/o pain throughout session.  Pt ambulated with RW to dresser  and retrieved clothing to donn then turned and returned to w/c with CGA.  Pt initially passively placing LUE onto walker splint needing min cues to encourage active reaching of affected arm and pt able to do so without assist but with compensatory movement patterns of shoulder with limited RC recruitment.  Pt demonstrated good follow through of hemi technique from previous OT session training to donn shirt and required less assist from mod to now min today.  Pt attempted to donn pants with no AE however unable to thread over feet due to limited reach and difficulty achieving figure 4 therefore educated pt on use of reacher to complete.  Pt donned pants using reacher with overall min assist and step by step Vcs for problem solving.  Also educated pt on hemi technique to donn socks and pt completed with mod assist.  Shoes and left AFO donned with total assist for time mgt.  Transported to gym via w/c.  Pt completed stand pivot using RW with CGA.  Sit to supine with min assist for LLE support.  Pt completed AAROM shoulder chest press using weightless dowel and abduction then sidelying ER and forward flexion, all using targets to facilitate external focus.  Also used tactile cues to increase  rotator cuff recruitment and reduce compensatory movements at neck and trunk.  Pt completed 2 x 12 reps of each task.  Direct hand off to PT at end of session.  Therapy Documentation Precautions:  Precautions Precautions: Fall Precaution Comments: L inattention/visual field cut, L hemi Restrictions Weight Bearing Restrictions: No    Therapy/Group: Individual Therapy  Ezekiel Slocumb 01/04/2021, 11:40 AM

## 2021-01-04 NOTE — Progress Notes (Signed)
Patient ID: Joel Peck, male   DOB: Jul 13, 1935, 86 y.o.   MRN: [id number]  [place] with son via telephone who is coming in Friday for education along with his sister and another family member. He feels pt may need to go to a NH. Discussed with his managed care insurance they will not likely cover for him to go to another facility from here. Will see how family training goes and discuss issues on Friday. Both aware he will require 24/7 care at discharge.

## 2021-01-04 NOTE — Progress Notes (Signed)
PROGRESS NOTE   Subjective/Complaints: No new complaints this morning He asks about why his Bp is elevated on his home machine when it looked fine on our machine- advised to keep checking home machine against ours to ensure that it is working properly.   ROS:  Pt denies SOB, abd pain, CP, N/V/C/D, and vision changes, +left foot third digit intermittent burning.   Objective:   No results found. No results for input(s): WBC, HGB, HCT, PLT in the last 72 hours.  Recent Labs    01/03/21 0511  NA 137  K 4.0  CL 101  CO2 27  GLUCOSE 103*  BUN 11  CREATININE 0.94  CALCIUM 9.1     Intake/Output Summary (Last 24 hours) at 01/04/2021 1144 Last data filed at 01/04/2021 1115 Gross per 24 hour  Intake 1080 ml  Output 1850 ml  Net -770 ml        Physical Exam: Vital Signs Blood pressure (!) 139/58, pulse (!) 58, temperature 98.4 F (36.9 C), temperature source Oral, resp. rate 17, height 5\' 7"  (1.702 m), weight 77.1 kg, SpO2 97 %.    General: awake, alert, appropriate, sitting up in bedside chair; NAD HENT: conjugate gaze; oropharynx moist CV: bradycardia; no JVD Pulmonary: CTA B/L; no W/R/R- good air movement GI: soft, NT, ND, (+)BS Psychiatric: appropriate Neurological: alert Psych: pleasant and cooperative  Skin: Clean and intact without signs of breakdown Neuro: oriented and alert. Reasonable insight and awareness. No CN signs. Left EF and EE 3/5 to 2/5 wrist,HI. LLE 3- HE, KE and 0/5 at ankle. Senses pain and light touch.  Musculoskeletal: normal rom. L foot 1+ edema- mild  Assessment/Plan: 1. Functional deficits which require 3+ hours per day of interdisciplinary therapy in a comprehensive inpatient rehab setting. Physiatrist is providing close team supervision and 24 hour management of active medical problems listed below. Physiatrist and rehab team continue to assess barriers to discharge/monitor patient  progress toward functional and medical goals  Care Tool:  Bathing    Body parts bathed by patient: Right lower leg, Left lower leg   Body parts bathed by helper: Left lower leg, Right lower leg, Right arm     Bathing assist Assist Level: Supervision/Verbal cueing     Upper Body Dressing/Undressing Upper body dressing Upper body dressing/undressing activity did not occur (including orthotics): Safety/medical concerns What is the patient wearing?: Pull over shirt    Upper body assist Assist Level: Minimal Assistance - Patient > 75%    Lower Body Dressing/Undressing Lower body dressing    Lower body dressing activity did not occur: Safety/medical concerns What is the patient wearing?: Pants, Incontinence brief     Lower body assist Assist for lower body dressing: Moderate Assistance - Patient 50 - 74%     Toileting Toileting    Toileting assist Assist for toileting: Minimal Assistance - Patient > 75%     Transfers Chair/bed transfer  Transfers assist     Chair/bed transfer assist level: Contact Guard/Touching assist     Locomotion Ambulation   Ambulation assist      Assist level: Contact Guard/Touching assist Assistive device: Walker-rolling Max distance: >300'   Walk 10 feet  activity   Assist  Walk 10 feet activity did not occur: Safety/medical concerns  Assist level: Contact Guard/Touching assist Assistive device: Walker-rolling   Walk 50 feet activity   Assist Walk 50 feet with 2 turns activity did not occur: Safety/medical concerns  Assist level: Contact Guard/Touching assist Assistive device: Walker-rolling    Walk 150 feet activity   Assist Walk 150 feet activity did not occur: Safety/medical concerns  Assist level: Contact Guard/Touching assist Assistive device: Walker-rolling    Walk 10 feet on uneven surface  activity   Assist Walk 10 feet on uneven surfaces activity did not occur: Safety/medical concerns          Wheelchair     Assist Is the patient using a wheelchair?: Yes Type of Wheelchair: Manual    Wheelchair assist level: Dependent - Patient 0% Max wheelchair distance: 133ft    Wheelchair 50 feet with 2 turns activity    Assist        Assist Level: Dependent - Patient 0%   Wheelchair 150 feet activity     Assist      Assist Level: Dependent - Patient 0%   Blood pressure (!) 139/58, pulse (!) 58, temperature 98.4 F (36.9 C), temperature source Oral, resp. rate 17, height 5\' 7"  (1.702 m), weight 77.1 kg, SpO2 97 %.  Medical Problem List and Plan: 1. 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  Impaired mobility, ambulating 165 feet: may discontinue Lovenox, clear to shave.              -antiplatelet therapy: DAPT X 3 months followed by ASA alone.  3. Pain: Decrease oxycodone to 1 tab daily prn  4. Insomnia: continue trazodone 75mg  HS               -antipsychotic agents: N/A    -continue to provide egosupport and positive reinforcement 5. Neuropsych: This patient is capable of making decisions on his own behalf. 6. Skin/Wound Care: Routine pressure relief measures.              --monitor right neck incision for healing/edema.  7. Fluids/Electrolytes/Nutrition: Monitor I/O. Check lytes in am.  8. HTN: Monitor BP TID with SBP goals 110-120 to avoid hypoperfusion.              --On Norvasc and metoprolol BID. Advised to check home BP cuff against our measurements to ensure his home machine is working properly.        Vitals:    12/21/20 1959 12/22/20 0456  BP: (!) 140/59 128/61  Pulse: 68 66  Resp: 18 18  Temp: 98.4 F (36.9 C) 98.4 F (36.9 C)  SpO2: 98% 97%  1/2- BP controlled- con't regimen 9. AKI: SCr has risen from 1.03-->1.30. Likely due to  dye/hypotension             --Encourage fluid intake. 12/21 recheck SCr improved to 0.9 10. Leucocytosis: Likely reactive due to surgery. Will monitor for signs of infection 11. ABLA: Likely due to thrombolytics/surgery with drop in  Hgb 14.3--->10.9---> 11.3 on 12/21 --Will monitor with serial CBC for trend as on DAPT/Lovenox  12. Hyperlipidemia: LDL 111. Continue Crestor.  13. BPH: Continue Flomax. Will check PVRs as reporting hesitancy.  14. Post CEA odynophagia/dysphagia: He is ordering appropriate foods. Will monitor for now.  --he does not want diet adjusted at this time 15. Insomnia: Will increase melatonin to 5 mg. Family to spend night for support.  16. Chronic constipation-     -had large bm 12/22  -continue current mom and colace in pm  1/2- LBM this AM- "not finished". Needs to go again.  17. GERD- will increase Protonix- used Omeprazole at home.   18.  Odynophagia related to CEA -some crackles at right base on exam. CXR clear  -encourage IS, FV  Placed nursing order to bring him tea with honey daily.  19. Productive cough: continue robitussin. Add mucinex q12H 20. Prediabetes: placed nursing order to provide only water, no drinks with added sugar 21. Left foot third digit burning: add prn gabapentin 100mg  HS     LOS: 14 days A FACE TO FACE EVALUATION WAS PERFORMED  Martha Clan P Jezabella Schriever 01/04/2021, 11:44 AM

## 2021-01-04 NOTE — Progress Notes (Signed)
Occupational Therapy Session Note  Patient Details  Name: Joel Peck MRN: [id number] Date of Birth: Aug 23, 1935  Today's Date: 01/04/2021 OT Individual Time: 3419-3790 OT Individual Time Calculation (min): 26 min    Short Term Goals: Week 1:  OT Short Term Goal 1 (Week 1): Pt will demonstrate static standing balance with min assist for self care prep. OT Short Term Goal 1 - Progress (Week 1): Met OT Short Term Goal 2 (Week 1): Pt will use LUE as stabilizer with mod multimodal cues. OT Short Term Goal 2 - Progress (Week 1): Met OT Short Term Goal 3 (Week 1): Pt will donn shirt with min assist and min multimodal cues. OT Short Term Goal 3 - Progress (Week 1): Met OT Short Term Goal 4 (Week 1): Pt will complete toilet transfer with min assist. OT Short Term Goal 4 - Progress (Week 1): Met Week 2:  OT Short Term Goal 1 (Week 2): pt will complete UB dressing with CGA using hemi techniques as needed OT Short Term Goal 2 (Week 2): pt will complete 2/3 parts of toileting with MIN A OT Short Term Goal 3 (Week 2): pt will complete 1 grooming task in standing with CGA   Skilled Therapeutic Interventions/Progress Updates:    Pt greeted at time of session sitting up in wheelchair agreeable to OT session, no pain, and stating he wanted to wash up as he feels like hes been "dribbling" and wants to clean up. Setup at sink level and pt sit <> stand at sink with CGA, able to wash periarea and buttocks in same manner in standing. OT assisting with getting soap but pt able to lather and wash/rinse. Sit > stand at sink while therapist assisted with new brief, Min A for donning pants over L hip. Pt set up with LUE supported in trough, supported with pillows as well, alarm on call bell in reach.   Therapy Documentation Precautions:  Precautions Precautions: Fall Precaution Comments: L inattention/visual field cut, L hemi Restrictions Weight Bearing Restrictions: No     Therapy/Group: Individual  Therapy  Viona Gilmore 01/04/2021, 1:00 PM

## 2021-01-05 NOTE — Progress Notes (Signed)
Occupational Therapy Session Note  Patient Details  Name: Joel Peck MRN: [id number] Date of Birth: [birth year]  Today's Date: 01/05/2021 OT Individual Time: 4196-2229 and 7989-2119 OT Individual Time Calculation (min): 56 min and 54 min   Short Term Goals: Week 1:  OT Short Term Goal 1 (Week 1): Pt will demonstrate static standing balance with min assist for self care prep. OT Short Term Goal 1 - Progress (Week 1): Met OT Short Term Goal 2 (Week 1): Pt will use LUE as stabilizer with mod multimodal cues. OT Short Term Goal 2 - Progress (Week 1): Met OT Short Term Goal 3 (Week 1): Pt will donn shirt with min assist and min multimodal cues. OT Short Term Goal 3 - Progress (Week 1): Met OT Short Term Goal 4 (Week 1): Pt will complete toilet transfer with min assist. OT Short Term Goal 4 - Progress (Week 1): Met Week 2:  OT Short Term Goal 1 (Week 2): pt will complete UB dressing with CGA using hemi techniques as needed OT Short Term Goal 2 (Week 2): pt will complete 2/3 parts of toileting with MIN A OT Short Term Goal 3 (Week 2): pt will complete 1 grooming task in standing with CGA   Skilled Therapeutic Interventions/Progress Updates:    Session 1: Pt greeted at time of session sitting up in wheelchair just finished PT session and on phone with son regarding getting a new shoe. Pt agreeable to OT session, no pain throughout. Pt wanting to take a shower in afternoon session. Pt transported to ADL apartment and short distance ambulation with new AFO CGA w/ RW and L hand splint, transfers in this same manner throughout session. Sit > stand from low soft sofa with Min A 2/2 low surface. Focused on donning/doffing new AFO x2 with Mod/Max A needed to thread foot into shoe and total A tying shoe but pt able to apply top strap. Set up on SCIFIT for 5 mins forward and 5 mins backward with ACE on L hand to assist for BUE ROM and strengthening. Attempted bimaual task of throwing large therapy ball, 1x5  with assist distally to support LUE. Bean bag activity reaching with R hand, translating to L and crossing midline to place on mat on L side to improve functional grasp release pattern and functional use of L hand. Pt transported back to room, set up alarm on call bell in reach.   Session 2: Pt greeted at time of session sitting up in wheelchair ready for OT session, no pain throughout. Pt wanting to take a shower, ambulated wheelchair > bathroom CGA with RW and L hand splint, performed all transfers this manner throughout session. Transferred to shower bench same manner and doffed clothing sitting and standing. Performed UB/LB bathing CGA for standing portions and Min A only to thoroughly wash under R arm. Utilized hand over hand when using L hand during bathing and dressing/hygiene tasks. Dried off same manner before walking out to bed level for dressing tasks. UB dress Min A to fully get past L elbow and Min/Mod A for pants as well with assist to thread LLE into the correct leg hole. Pt needing min cues throughout session to remember hemitechniques and fully fasten LUE to splint before transfers. Pt up in wheelchair alarm on call bell in reach and LUE elevated.   Therapy Documentation Precautions:  Precautions Precautions: Fall Precaution Comments: L inattention/visual field cut, L hemi Restrictions Weight Bearing Restrictions: No     Therapy/Group: Individual  Therapy  Viona Gilmore 01/05/2021, 7:19 AM

## 2021-01-05 NOTE — Discharge Instructions (Addendum)
Inpatient Rehab Discharge Instructions  Joel Peck Discharge date and time: 01/11/21   Activities/Precautions/ Functional Status: Activity: no lifting, driving, or strenuous exercise  till cleared by MD Diet: cardiac diet Limit carbs and sweets due to pre-diabetes.  Wound Care: keep wound clean and dry   Functional status:  ___ No restrictions     ___ Walk up steps independently _X__ 24/7 supervision/assistance   ___ Walk up steps with assistance ___ Intermittent supervision/assistance  ___ Bathe/dress independently ___ Walk with walker     _X__ Bathe/dress with assistance ___ Walk Independently    ___ Shower independently __X_ Walk with supervision/contact guard assist.  ___ Shower with assistance _X__ No alcohol     ___ Return to work/school ________    Special Instructions:   COMMUNITY REFERRALS UPON DISCHARGE:    Home Health:   PT  OT   RN                Agency: [facility]  Phone:  [phone]  Medical Equipment/Items Ordered:HAS ALL NEEDED EQUIPMENT FROM PAST ADMISSIONS                                                 Agency/Supplier:NA  STROKE/TIA DISCHARGE INSTRUCTIONS SMOKING Cigarette smoking nearly doubles your risk of having a stroke & is the single most alterable risk factor  If you smoke or have smoked in the last 12 months, you are advised to quit smoking for your health. Most of the excess cardiovascular risk related to smoking disappears within a year of stopping. Ask you doctor about anti-smoking medications [place] Quit Line: 1-800-QUIT NOW Free Smoking Cessation Classes [phone]  CHOLESTEROL Know your levels; limit fat & cholesterol in your diet  Lipid Panel     Component Value Date/Time   CHOL 165 12/13/2020 0449   TRIG 28 12/13/2020 0449   HDL 48 12/13/2020 0449   CHOLHDL 3.4 12/13/2020 0449   VLDL 6 12/13/2020 0449   LDLCALC 111 (H) 12/13/2020 0449     Many patients benefit from treatment even if their cholesterol is at  goal. Goal: Total Cholesterol (CHOL) less than 160 Goal:  Triglycerides (TRIG) less than 150 Goal:  HDL greater than 40 Goal:  LDL (LDLCALC) less than 100   BLOOD PRESSURE American Stroke Association blood pressure target is less that 120/80 mm/Hg  Your discharge blood pressure is:  BP: (!) 150/69 Monitor your blood pressure Limit your salt and alcohol intake Many individuals will require more than one medication for high blood pressure  DIABETES (A1c is a blood sugar average for last 3 months) Goal HGBA1c is under 7% (HBGA1c is blood sugar average for last 3 months)  Diabetes: Pre diabetes    Lab Results  Component Value Date   HGBA1C 6.4 (H) 12/13/2020    Your HGBA1c can be lowered with medications, healthy diet, and exercise. Check your blood sugar as directed by your physician Call your physician if you experience unexplained or low blood sugars.  PHYSICAL ACTIVITY/REHABILITATION Goal is 30 minutes at least 4 days per week  Activity: No driving, Therapies: see above Return to work: N/A Activity decreases your risk of heart attack and stroke and makes your heart stronger.  It helps control your weight and blood pressure; helps you relax and can improve your mood. Participate in a regular exercise program. Talk  with your doctor about the best form of exercise for you (dancing, walking, swimming, cycling).  DIET/WEIGHT Goal is to maintain a healthy weight  Your discharge diet is:  Diet Order             Diet Heart Room service appropriate? Yes; Fluid consistency: Thin  Diet effective now                   liquids Your height is:  Height: 5\' 7"  (170.2 cm) Your current weight is: Weight: 77.1 kg Your Body Mass Index (BMI) is:  BMI (Calculated): 26.62 Following the type of diet specifically designed for you will help prevent another stroke. You goal weight is:   Your goal Body Mass Index (BMI) is 19-24. Healthy food habits can help reduce 3 risk factors for stroke:  High  cholesterol, hypertension, and excess weight.  RESOURCES Stroke/Support Group:  Call [phone]   STROKE EDUCATION PROVIDED/REVIEWED AND GIVEN TO PATIENT Stroke warning signs and symptoms How to activate emergency medical system (call 911). Medications prescribed at discharge. Need for follow-up after discharge. Personal risk factors for stroke. Pneumonia vaccine given:  Flu vaccine given:  My questions have been answered, the writing is legible, and I understand these instructions.  I will adhere to these goals & educational materials that have been provided to me after my discharge from the hospital.     My questions have been answered and I understand these instructions. I will adhere to these goals and the provided educational materials after my discharge from the hospital.  Patient/Caregiver Signature _______________________________ Date __________  Clinician Signature _______________________________________ Date __________  Please bring this form and your medication list with you to all your follow-up doctor's appointments.         Information on my medicine - ELIQUIS (apixaban)  Why was Eliquis prescribed for you? Eliquis was prescribed to treat blood clots that may have been found in the veins of your legs (deep vein thrombosis) or in your lungs (pulmonary embolism) and to reduce the risk of them occurring again.  What do You need to know about Eliquis ? The starting dose is 10 mg (two 5 mg tablets) taken TWICE daily for the FIRST SEVEN (7) DAYS, then on (enter date)  01/18/2021  the dose is reduced to ONE 5 mg tablet taken TWICE daily.  Eliquis may be taken with or without food.   Try to take the dose about the same time in the morning and in the evening. If you have difficulty swallowing the tablet whole please discuss with your pharmacist how to take the medication safely.  Take Eliquis exactly as prescribed and DO NOT stop taking Eliquis without talking to the  doctor who prescribed the medication.  Stopping may increase your risk of developing a new blood clot.  Refill your prescription before you run out.  After discharge, you should have regular check-up appointments with your healthcare provider that is prescribing your Eliquis.    What do you do if you miss a dose? If a dose of ELIQUIS is not taken at the scheduled time, take it as soon as possible on the same day and twice-daily administration should be resumed. The dose should not be doubled to make up for a missed dose.  Important Safety Information A possible side effect of Eliquis is bleeding. You should call your healthcare provider right away if you experience any of the following: Bleeding from an injury or your nose that does not stop. Unusual  colored urine (red or dark brown) or unusual colored stools (red or black). Unusual bruising for unknown reasons. A serious fall or if you hit your head (even if there is no bleeding).  Some medicines may interact with Eliquis and might increase your risk of bleeding or clotting while on Eliquis. To help avoid this, consult your healthcare provider or pharmacist prior to using any new prescription or non-prescription medications, including herbals, vitamins, non-steroidal anti-inflammatory drugs (NSAIDs) and supplements.  This website has more information on Eliquis (apixaban): [email or web address]://[email or web address]

## 2021-01-05 NOTE — Progress Notes (Signed)
PROGRESS NOTE   Subjective/Complaints: Doing great with therapy! Commended on progress Has some LLE edema- will get ultrasound to assess for clot, likely dependent edema Had some intermittent toe burning at night, received 100mg  gabapentin and this helped.   ROS:  Pt denies SOB, abd pain, CP, N/V/C/D, and vision changes, +left foot third digit intermittent burning.   Objective:   No results found. No results for input(s): WBC, HGB, HCT, PLT in the last 72 hours.  Recent Labs    01/03/21 0511  NA 137  K 4.0  CL 101  CO2 27  GLUCOSE 103*  BUN 11  CREATININE 0.94  CALCIUM 9.1     Intake/Output Summary (Last 24 hours) at 01/05/2021 1150 Last data filed at 01/05/2021 1020 Gross per 24 hour  Intake 840 ml  Output 1350 ml  Net -510 ml        Physical Exam: Vital Signs Blood pressure (!) 140/56, pulse 60, temperature 98.2 F (36.8 C), temperature source Oral, resp. rate 14, height 5\' 7"  (1.702 m), weight 77.1 kg, SpO2 97 %. Gen: no distress, normal appearing HEENT: oral mucosa pink and moist, NCAT Cardio: Reg rate Chest: normal effort, normal rate of breathing Abd: soft, non-distended Ext: no edema Psych: pleasant and cooperative  Skin: Clean and intact without signs of breakdown Neuro: oriented and alert. Reasonable insight and awareness. No CN signs. Left EF and EE 3/5 to 2/5 wrist,HI. LLE 3- HE, KE and 0/5 at ankle. Senses pain and light touch.  Musculoskeletal: normal rom. L foot 1+ edema- mild  Assessment/Plan: 1. Functional deficits which require 3+ hours per day of interdisciplinary therapy in a comprehensive inpatient rehab setting. Physiatrist is providing close team supervision and 24 hour management of active medical problems listed below. Physiatrist and rehab team continue to assess barriers to discharge/monitor patient progress toward functional and medical goals  Care Tool:  Bathing    Body  parts bathed by patient: Right lower leg, Left lower leg   Body parts bathed by helper: Left lower leg, Right lower leg, Right arm     Bathing assist Assist Level: Supervision/Verbal cueing     Upper Body Dressing/Undressing Upper body dressing Upper body dressing/undressing activity did not occur (including orthotics): Safety/medical concerns What is the patient wearing?: Pull over shirt    Upper body assist Assist Level: Minimal Assistance - Patient > 75%    Lower Body Dressing/Undressing Lower body dressing    Lower body dressing activity did not occur: Safety/medical concerns What is the patient wearing?: Pants, Incontinence brief     Lower body assist Assist for lower body dressing: Moderate Assistance - Patient 50 - 74%     Toileting Toileting    Toileting assist Assist for toileting: Minimal Assistance - Patient > 75%     Transfers Chair/bed transfer  Transfers assist     Chair/bed transfer assist level: Supervision/Verbal cueing     Locomotion Ambulation   Ambulation assist      Assist level: Supervision/Verbal cueing Assistive device: Walker-rolling Max distance: >300'   Walk 10 feet activity   Assist  Walk 10 feet activity did not occur: Safety/medical concerns  Assist level: Supervision/Verbal cueing  Assistive device: Walker-rolling   Walk 50 feet activity   Assist Walk 50 feet with 2 turns activity did not occur: Safety/medical concerns  Assist level: Supervision/Verbal cueing Assistive device: Walker-rolling    Walk 150 feet activity   Assist Walk 150 feet activity did not occur: Safety/medical concerns  Assist level: Supervision/Verbal cueing Assistive device: Walker-rolling    Walk 10 feet on uneven surface  activity   Assist Walk 10 feet on uneven surfaces activity did not occur: Safety/medical concerns         Wheelchair     Assist Is the patient using a wheelchair?: Yes Type of Wheelchair: Manual     Wheelchair assist level: Dependent - Patient 0% Max wheelchair distance: 170ft    Wheelchair 50 feet with 2 turns activity    Assist        Assist Level: Dependent - Patient 0%   Wheelchair 150 feet activity     Assist      Assist Level: Dependent - Patient 0%   Blood pressure (!) 140/56, pulse 60, temperature 98.2 F (36.8 C), temperature source Oral, resp. rate 14, height 5\' 7"  (1.702 m), weight 77.1 kg, SpO2 97 %.  Medical Problem List and Plan: 1. 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  Impaired mobility, ambulating 165 feet: may discontinue Lovenox, clear to shave.              -antiplatelet therapy: DAPT X 3 months followed by ASA alone.  3. Pain: d/c oxycodone 4. Insomnia: continue trazodone 75mg  HS               -antipsychotic agents: N/A    -continue to provide egosupport and positive reinforcement 5. Neuropsych: This patient is capable of making decisions on his own behalf. 6. Skin/Wound Care: Routine pressure relief measures.              --monitor right neck incision for healing/edema.  7. Fluids/Electrolytes/Nutrition: Monitor I/O. Check lytes in am.  8. HTN: Monitor BP TID with SBP goals 110-120 to avoid hypoperfusion.              --On Norvasc and metoprolol BID. Advised to check home BP cuff against our measurements to ensure his home machine is working properly.        Vitals:    12/21/20 1959 12/22/20 0456  BP: (!) 140/59 128/61  Pulse: 68 66  Resp: 18 18  Temp: 98.4 F (36.9 C) 98.4 F (36.9 C)  SpO2: 98% 97%  1/2- BP controlled- con't regimen 9. AKI: SCr has risen from 1.03-->1.30. Likely due to dye/hypotension             --Encourage fluid intake. 12/21 recheck SCr improved to 0.9 10. Leucocytosis: Likely reactive due to  surgery. Will monitor for signs of infection 11. ABLA: Likely due to thrombolytics/surgery with drop in  Hgb 14.3--->10.9---> 11.3 on 12/21 --Will monitor with serial CBC for trend as on DAPT/Lovenox  12. Hyperlipidemia: LDL 111. Continue Crestor.  13. BPH: Continue Flomax. Will check PVRs as reporting hesitancy.  14. Post CEA odynophagia/dysphagia: He is ordering appropriate foods. Will monitor for now.  --he does not want  diet adjusted at this time 15. Insomnia: Will increase melatonin to 5 mg. Family to spend night for support.  16. Chronic constipation-     -had large bm 12/22  -continue current mom and colace in pm  1/2- LBM this AM- "not finished". Needs to go again.  17. GERD- will increase Protonix- used Omeprazole at home.   18.  Odynophagia related to CEA -some crackles at right base on exam. CXR clear  -encourage IS, FV  Placed nursing order to bring him tea with honey daily.  19. Productive cough: continue robitussin. Add mucinex q12H 20. Prediabetes: placed nursing order to provide only water, no drinks with added sugar 21. Left foot third digit burning: add prn gabapentin 100mg  HS 22. Left lower extremity edema: ultrasound ordered 23. Left lower extremity foot drop: AFO ordered     LOS: 15 days A FACE TO FACE EVALUATION WAS PERFORMED  Monte Zinni P Ailany Koren 01/05/2021, 11:50 AM

## 2021-01-05 NOTE — Progress Notes (Signed)
Pt concerned about  receiving lopressor at 841p. He stated he was supposed to receive it at 8p. He was educated that he can receive the medication within the hour. He believes this is why his blood pressure has increased. He would like to speak with he doctor concerning this. He was educated. No other comcplaints at this time.

## 2021-01-05 NOTE — Progress Notes (Signed)
Physical Therapy Session Note  Patient Details  Name: Joel Peck MRN: [id number] Date of Birth: Feb 22, 1935  Today's Date: 01/05/2021 PT Individual Time: 1402-1430 PT Individual Time Calculation (min): 28 min   Short Term Goals: Week 2:  PT Short Term Goal 1 (Week 2): Pt will complete bed mobility with CGA PT Short Term Goal 2 (Week 2): Pt will complete bed<>chair transfers with CGA and LRAD PT Short Term Goal 3 (Week 2): Pt will ambulate 188ft with CGA and LRAD PT Short Term Goal 4 (Week 2): Pt will navigate 6inch curb with minA and LRAD PT Short Term Goal 5 (Week 2): Family education will be initiated  Skilled Therapeutic Interventions/Progress Updates:      Pt seated in w/c to start session - agreeable to PT tx and no reports of pain. Assisted in donning socks/shoes/AFO with totalA for time management. Instructed in sit<>stand to RW with CGA and pt able to place his LUE into RW splint without assist. Pt ambulated to day room rehab gym, ~155ft, with CGA and RW. Focused remainder of session on dynamic gait training with NO AD to challenge balance and progress gait. Completed obstacle course which included weaving in/out of cones, stepping over 5inch obstacles, and stepping on/off compliant surfaces. He required minA for steadying/balance while doing these tasks with increased difficulty stepping over hurddles with his paretic L side. He responded well to min verbal cueing for increasing hip/knee flexion to improve foot clearance. Next, continued to work on dynamic gait with NO AD while working on increasing/decreasing gait speed and stopping/turning upon PT command - required minA guard. Pt completed session seated in w/c with chair alarm on and all needs within reach.    Therapy Documentation Precautions:  Precautions Precautions: Fall Precaution Comments: L inattention/visual field cut, L hemi Restrictions Weight Bearing Restrictions: No General:     Therapy/Group: Individual  Therapy  Alger Simons 01/05/2021, 3:42 PM

## 2021-01-05 NOTE — Progress Notes (Signed)
Physical Therapy Session Note  Patient Details  Name: Joel Peck MRN: [id number] Date of Birth: 08-09-1935  Today's Date: 01/05/2021 PT Individual Time: 6578-4696 + 1130-1200 PT Individual Time Calculation (min): 25 min  + 30 min  Short Term Goals: Week 2:  PT Short Term Goal 1 (Week 2): Pt will complete bed mobility with CGA PT Short Term Goal 2 (Week 2): Pt will complete bed<>chair transfers with CGA and LRAD PT Short Term Goal 3 (Week 2): Pt will ambulate 165ft with CGA and LRAD PT Short Term Goal 4 (Week 2): Pt will navigate 6inch curb with minA and LRAD PT Short Term Goal 5 (Week 2): Family education will be initiated  Skilled Therapeutic Interventions/Progress Updates:     1st session: Pt greeted seated in w/c to start. Agreeable to PT tx without reports of pain. Reports his son brought shoes but they were incorrect size. Informed him he needs 9.5 WIDE shoes to allow fitting for AFO and edema management. Pt verbalized understanding and reports he will call son to update. Pt also has his personal AFO delivered from [place]. Pt assisted in UB and LB dressing where he could complete UB dressing with setupA and LB dressing with minA for threading LE's. Able to pull pants up in standing while using RW and CGA for balance. Donned shoes and his personal AFO with totalA for time. Focused remainder of session on functional gait training in rehab hallways. Sit<>Stand to RW with CGA and ambulated ~267ft with CGA and RW - worked on dynamic gait with PT instructing on increasing/decreasing gait speed, turning, stopping, upon command. Pt with great safety awareness while turning and responding to commands. Noted LLE fatigue towards end of gait, demonstrating some mild foot catching in swing with VC for corrections. Pt completed session seated in w/c with chair alarm on and all needs within reach.  2nd session: Pt sitting in w/c with direct handoff of care from NT. Pt  agreeable to PT tx. Donned socks/shoes/AFO with totalA for time. Transported in w/c to main rehab gym. Worked on stair training where he required CGA for navigating x12 steps using 1 hand rail on R with min verbal cues for appropriate sequencing (step up with R, down with L). Also worked on Water engineer (8inch curb) with RW and minA for steadying and [employer]. Pt with good carryover and memory of prior skills from learning this a few days ago. Completed curb transfers x4 times for practice. Ambulated back to his room, `212ft, with CGA and RW with similar deficits as above and VC for monitoring L foot clearance and awareness of fatigue. Pt concluded session seated in w/c with chair alarm on, LUE supported with pillow, all needs within reach.   Therapy Documentation Precautions:  Precautions Precautions: Fall Precaution Comments: L inattention/visual field cut, L hemi Restrictions Weight Bearing Restrictions: No General:    Therapy/Group: Individual Therapy  Karol Liendo P Sapphira Harjo 01/05/2021, 7:45 AM

## 2021-01-05 NOTE — Progress Notes (Signed)
Patient ID: Joel Peck, male   DOB: [birth year], 86 y.o.   MRN: [id number]  Met with pt to update him regarding team conference progress toward his goals of CGA-supervision level and target discharge still 1/10. Hopefully he will surprise his family when here for family education on Friday. He feels he is doing quite well and can almost be just supervision. Discussed their concerns and he feels he will show them on Friday and they will be less concerned about his care. Discussed starting off with home health and transitioning to OP, once comfortable with routine at home then transition. Updated daughter he lives with also. Will see daughter, son and another family member on Friday. Pt has all needed equipment from past admission.

## 2021-01-05 NOTE — Patient Care Conference (Signed)
Inpatient RehabilitationTeam Conference and Plan of Care Update Date: 01/05/2021   Time: 11:00 AM    Patient Name: Joel Peck      Medical Record Number: [id number]  Date of Birth: [birth year] Sex: Male         Room/Bed: 4W13C/4W13C-01 Payor Info: Payor: [employer] / Plan: [employer] Must PPO / Product Type: *No Product type* /    Admit Date/Time:  12/21/2020  4:05 PM  Primary Diagnosis:  Acute ischemic right MCA stroke [facility])  Hospital Problems: Principal Problem:   Acute ischemic right MCA stroke [facility])    Expected Discharge Date: Expected Discharge Date: 01/11/21  Team Members Present: Physician leading conference: Dr. Leeroy Cha Social Worker Present: Ovidio Kin, LCSW Nurse Present: Dorien Chihuahua, RN PT Present: Ginnie Smart, PT OT Present: Clyda Greener, OT PPS Coordinator present : Gunnar Fusi, SLP     Current Status/Progress Goal Weekly Team Focus  Bowel/Bladder   Patient is continent bowel/bladder  Pt goal has been met and will remain continent  Offer toileting assist as needed   Swallow/Nutrition/ Hydration             ADL's   Sup for UB bathing, min A UB dressing, Min-mod assist LB dressing, min A functional transfers  CGA-supervision  self care, transfers, neuro re-ed, hemi strategies, left attention and scanning   Mobility   minA sit<>stands, minA bed<>chair transfers, minA gait 175f with RW. Determining AFO needs with [facility]  CGA/spv  Functional transfers, gait training, dynamic standing balance, LLE NMR, family ed   Communication             Safety/Cognition/ Behavioral Observations            Pain   Patient denies pain  Pt will remain free of pain  Assess pain q shift and PRN   Skin   Skin intact. skin glue incision noted to Rt neck  Pt skin will remain intact  Will assess q shift and PRN     Discharge Planning:  Family coming in for educaiton on Friday-son questioning care and if need SNF. Informed again not an  option due to coming here and he has managed care policy. Discuss again on Friday.   Team Discussion: Patient doing well overall. New onset of burning in toes; MD added medication. Left LE edema; MD ordered [place] r/o DVT. Patient on target to meet rehab goals: yes, currently needs supervision for upper body care and min assist for UB dressing. Needs min - mod assit for lower body care, showering and toileting. Completes transfers with CGA and able to ambulate up to 200' using a RW with CGA. Goals for discharge set for supervision - CGA overall.   *See Care Plan and progress notes for long and short-term goals.   Revisions to Treatment Plan:  N/A   Teaching Needs: Safety, transfers, toileting, medication management,secondary risk management, etc  Current Barriers to Discharge: Decreased caregiver support and Insurance for SNF coverage  Possible Resolutions to Barriers: Family education scheduled for 01/05/21 OP follow up services recommended     Medical Summary Current Status: left foot edema and impaired dorsiflexion, cough, left foot third digit intermittent numbness, prediabetes, hypertension  Barriers to Discharge: Medical stability  Barriers to Discharge Comments: left foot edema and impaired dorsiflexion, cough, left foot third digit intermittent numbness, prediabetes, hypertension Possible Resolutions to [employer]: vascular ultrasound today, AFO ordered, continue mucinex and prn robitussin, conitnue gabapentin 1033mHS PRN, provide dietary education  Continued Need for Acute Rehabilitation Level of Care: The patient requires daily medical management by a physician with specialized training in physical medicine and rehabilitation for the following reasons: Direction of a multidisciplinary physical rehabilitation program to maximize functional independence : Yes Medical management of patient stability for increased activity during participation in an intensive  rehabilitation regime.: Yes Analysis of laboratory values and/or radiology reports with any subsequent need for medication adjustment and/or medical intervention. : Yes   I attest that I was present, lead the team conference, and concur with the assessment and plan of the team.   Dorien Chihuahua B 01/05/2021, 11:59 AM

## 2021-01-05 NOTE — Progress Notes (Signed)
Orthopedic Tech Progress Note Patient Details:  Joel Peck 1935/09/15 [id number]  Called in order to [facility] for an AFO   Patient ID: Joel Peck, male   DOB: Jul 18, 1935, 86 y.o.   MRN: [id number]  Janit Pagan 01/05/2021, 10:00 AM

## 2021-01-05 NOTE — Progress Notes (Signed)
Physical Therapy Session Note  Patient Details  Name: Joel Peck MRN: [id number] Date of Birth: [birth year]  Today's Date: 01/05/2021 PT Individual Time: 1605-1706 PT Individual Time Calculation (min): 61 min   Short Term Goals: Week 2:  PT Short Term Goal 1 (Week 2): Pt will complete bed mobility with CGA PT Short Term Goal 2 (Week 2): Pt will complete bed<>chair transfers with CGA and LRAD PT Short Term Goal 3 (Week 2): Pt will ambulate 137ft with CGA and LRAD PT Short Term Goal 4 (Week 2): Pt will navigate 6inch curb with minA and LRAD PT Short Term Goal 5 (Week 2): Family education will be initiated  Skilled Therapeutic Interventions/Progress Updates:    Pt received sitting in w/c and eager to participate in therapy session. Donned L LE [place] Walk-on AFO and shoes total assist for time management. Updated pt's safety plan to ambulate to/from bathroom using RW while wearing shoes and L LE AFO with nursing staff - NT made aware. Sit>stand w/c>RW with pt managing L hand on/off RW orthosis with min verbal cuing but no physical assist - upon 1st attempt pt forgot to push up with R hand from w/c armrest resulting in LOB back to sitting in w/c and pt demos excellent awareness and problem solving to correct his error on the next trial and stood with CGA. Pt requesting to perform the arm bike stating he felt it really strengthened is L UE. Gait training  ~2109ft to ortho gym using RW with CGA - pt demos very intentional L LE hip/knee flexion to clear his foot during swing phase with good clearance (almost appears to move in a flexor synergy pattern). Therapist ACE wrapped L hand onto arm bike handle due to grip weakness. L UE NMR using UBE for bimanual, reciprocal task against level 4 resistance for 45minutes forward and 5 minutes backwards totaling 0.87miles.   Gait training ~167ft to main therapy gym using RW with CGA as above - continues to demo some possible L inattention though mild. Gait  training ~19ft with L HHA and light min assist for steadying/balance - demos slightly increased gait speed and same gait mechanics as with RW though slight increased in postural instability. Donned 4lb ankle weight on L LE and repeated gait with L HHA ~137ft targeting increased neural recruitment for L LE swing phase advancement with no significant observable changes in gait pattern and continues to clear foot adequately. Dynamic gait training and dual-task challenge without AD and still wearing 4lb ankle weight while locating numbered disks in order in hallway and collecting them with his L hand - pt able to grasp disks with increased time - light min assist for balance throughout.   Gait training ~179ft back to room using RW with CGA as described above. Pt left seated in w/c with needs in reach, L UE supported on arm trough and pillow, and seat belt alarm on.    Therapy Documentation Precautions:  Precautions Precautions: Fall Precaution Comments: L inattention/visual field cut, L hemi Restrictions Weight Bearing Restrictions: No   Pain:  Denies pain during session.   Therapy/Group: Individual Therapy  Tawana Scale , PT, DPT, NCS, CSRS  01/05/2021, 3:28 PM

## 2021-01-06 ENCOUNTER — Inpatient Hospital Stay (HOSPITAL_COMMUNITY): Payer: PPO

## 2021-01-06 DIAGNOSIS — M7989 Other specified soft tissue disorders: Secondary | ICD-10-CM | POA: Diagnosis not present

## 2021-01-06 MED ORDER — TRAZODONE HCL 50 MG PO TABS
50.0000 mg | ORAL_TABLET | Freq: Every day | ORAL | Status: DC
  Administered 2021-01-06 – 2021-01-10 (×5): 50 mg via ORAL
  Filled 2021-01-06 (×5): qty 1

## 2021-01-06 NOTE — Progress Notes (Signed)
Physical Therapy Weekly Progress Note  Patient Details  Name: Joel Peck MRN: [id number] Date of Birth: Apr 18, 1935  Beginning of progress report period: December 30, 2020 End of progress report period: January 07, 2020  Today's Date: 01/06/2021 PT Individual Time: 1003-1059 + 3354-5625 PT Individual Time Calculation (min): 56 min + 58 min  Patient has met 3 of 5 short term goals.  Pt making appropriate progress towards goals, this reporting period. He continues to be highly motivated and showing consistent participation in therapy sessions. Overall, he requires minA for bed mobility (for managing LLE), CGA for sit<>stand transfers to RW, CGA for bed<>chair transfers with RW, and is ambulated >160f with CGA and RW. He requires CGA/minA for navigating 6inch curb using RW. Family training is scheduled for tomorrow, 01/07/21, to prepare patient and family for upcoming DC on Tuesday, 01/11/21. Pt on track to meet LTG and LTG upgraded to reflect progress.   Patient continues to demonstrate the following deficits muscle weakness and muscle joint tightness, decreased cardiorespiratoy endurance, unbalanced muscle activation and decreased motor planning, and decreased standing balance, hemiplegia, and decreased balance strategies and therefore will continue to benefit from skilled PT intervention to increase functional independence with mobility.  Patient progressing toward long term goals. LTG upgraded to grossly supervision level assist. Continue plan of care.  PT Short Term Goals Week 2:  PT Short Term Goal 1 (Week 2): Pt will complete bed mobility with CGA PT Short Term Goal 1 - Progress (Week 2): Not met PT Short Term Goal 2 (Week 2): Pt will complete bed<>chair transfers with CGA and LRAD PT Short Term Goal 2 - Progress (Week 2): Met PT Short Term Goal 3 (Week 2): Pt will ambulate 1567fwith CGA and LRAD PT Short Term Goal 3 - Progress (Week 2): Met PT Short Term Goal 4 (Week 2): Pt will navigate  6inch curb with minA and LRAD PT Short Term Goal 4 - Progress (Week 2): Met PT Short Term Goal 5 (Week 2): Family education will be initiated PT Short Term Goal 5 - Progress (Week 2): Partly met Week 3:  PT Short Term Goal 1 (Week 3): STG = LTG due to ELOS  Skilled Therapeutic Interventions/Progress Updates:     1st session: Pt presents seated in w/c and agreeable to PT tx. His family brought in larger shoes (9.5) to allow better fitting for his AFO. Donned shoes and AFO with totalA for time. Sit<>stand to RW with CGA and pt able to manage LUE into RW splint without assist. Pt ambulated ~15077fith CGA and RW to main rehab gym with focus on assessing shoe fitting since plan is for toe cap on L shoe. Pt subjectively reporting good fitting and no impact on gait quality noted. Notified CPO in order to get toe cap completed. In rehab gym, focused remainder of session on NMR for LLE strengthening, stance control, and closed chain feedback. NMR activities included single leg toe taps on R with BUE/unilateral UE support, split foot lunges with L vs R foot forward, box squats with LLE bias (R foot anterior), box squats + eccentric lowering (counting to 5) with LLE bias. Pt required minA for balance while completing all tasks other than lungs which required modA. Pt ambulated back to his room with CGA/minA and RW (increased assist needed due to LLE fatigue) with x1 LOB while turning to sit in w/c, that was self correcting. Pt concluded session seated in w/c with chair alarm on and all needs within  reach.    2nd session: Per chart review, pt found to have + DVT in LLE. Verbal confirmation from PA Setzer to resume therapies and activity as tolerated. Updated pt on this and he was agreeable to PT tx. Provided him AFO handout for [facility] which included wear schedule, skin checks, washing and management of AFO, etc. Wrote down phone # and address in case contact is needed. Pt completed supine<>sit with  supervision with HOB flat but reliant on bed rail to assist with trunk support. Took vitals in sitting: BP 156/69, HR 65, O2 98%. Pt asymptomatic but monitored throughout session due to known DVT. Sit<>stand to RW with CGA and ambulated with CGA and RW ~126f to main rehab gym. Pt completed supine and sidelying there-ex for remainder of session. These included 3x10 bridges, 2x10 LLE knee to chest and leg press on therapy ball, 3x10 sidelying clam shells. VC during exercises for monitoring breathing to avoid valsalva and for correct muscle activation and exercise sequencing. Pt ambulated back to his room with CGA and RW with cues for monitoring fatigue level to improve safety awareness. Requested to remain seated in w/c at end of session. Remained seated with chair alarm on, all needs in reach, made comfortable.   Therapy Documentation Precautions:  Precautions Precautions: Fall Precaution Comments: L inattention/visual field cut, L hemi Restrictions Weight Bearing Restrictions: No General:    Therapy/Group: Individual Therapy  CAlger Simons1/05/2021, 7:47 AM

## 2021-01-06 NOTE — Progress Notes (Signed)
Left lower extremity venous duplex has been completed. Preliminary results can be found in CV Proc through chart review.  Results were given to the patient's nurse, Baxter Kail.  01/06/21 1:39 PM Joel Peck RVT

## 2021-01-06 NOTE — Progress Notes (Signed)
Pt refused the Milk of Mag. Pt educated on the providers reason for ordering. Pt continued to refuse.

## 2021-01-06 NOTE — Progress Notes (Signed)
Occupational Therapy Session Note  Patient Details  Name: Joel Peck MRN: [id number] Date of Birth: [birth year]  Today's Date: 01/06/2021 OT Individual Time: 1102-1203 OT Individual Time Calculation (min): 61 min    Short Term Goals: Week 2:  OT Short Term Goal 1 (Week 2): pt will complete UB dressing with CGA using hemi techniques as needed OT Short Term Goal 2 (Week 2): pt will complete 2/3 parts of toileting with MIN A OT Short Term Goal 3 (Week 2): pt will complete 1 grooming task in standing with CGA  Skilled Therapeutic Interventions/Progress Updates:    Pt completed bathing and dressing during session.  Min guard assist for functional mobility from the wheelchair at bedside to the walk-in shower with the RW and hand splint on the left side.  He was then able to remove UB clothing with supervision and increased time.  Lower body undressing was completed at mod assist level.  He was able to complete all bathing sit to stand with min assist using the grab bars for support.  Min facilitation was needed for using the LUE to wash the right arm and underarm area with pt demonstrating use as a stabilizer with supervision.  Once he dried off, he was able to ambulate to the wheelchair for dressing.  Supervision for donning pullover shirt with mod assist to complete all LB dressing including donning brief, pants, and gripper socks.  Finished session with pt left sitting in the bed and with the call button and phone in reach and safety alarm pad in place.    Therapy Documentation Precautions:  Precautions Precautions: Fall Precaution Comments: L inattention/visual field cut, L hemi Restrictions Weight Bearing Restrictions: No  Pain: Pain Assessment Pain Scale: Faces Pain Score: 0-No pain ADL: See Care Tool Section for some details of mobility and selfcare  Therapy/Group: Individual Therapy  Pratik Dalziel,Abdul OTR/L 01/06/2021, 12:58 PM

## 2021-01-06 NOTE — Progress Notes (Signed)
Occupational Therapy Session Note  Patient Details  Name: Joel Peck MRN: [id number] Date of Birth: Nov 20, 1935  Today's Date: 01/06/2021 OT Individual Time: 9093-1121 OT Individual Time Calculation (min): 41 min    Short Term Goals: Week 1:  OT Short Term Goal 1 (Week 1): Pt will demonstrate static standing balance with min assist for self care prep. OT Short Term Goal 1 - Progress (Week 1): Met OT Short Term Goal 2 (Week 1): Pt will use LUE as stabilizer with mod multimodal cues. OT Short Term Goal 2 - Progress (Week 1): Met OT Short Term Goal 3 (Week 1): Pt will donn shirt with min assist and min multimodal cues. OT Short Term Goal 3 - Progress (Week 1): Met OT Short Term Goal 4 (Week 1): Pt will complete toilet transfer with min assist. OT Short Term Goal 4 - Progress (Week 1): Met Week 2:  OT Short Term Goal 1 (Week 2): pt will complete UB dressing with CGA using hemi techniques as needed OT Short Term Goal 2 (Week 2): pt will complete 2/3 parts of toileting with MIN A OT Short Term Goal 3 (Week 2): pt will complete 1 grooming task in standing with CGA  Skilled Therapeutic Interventions/Progress Updates:    Pt received seated in w/c, no c/o pain, agreeable to therapy. Session focus on self-care retraining, activity tolerance, transfer retraining, LUE NMR in prep for improved ADL/IADL/func mobility performance + decreased caregiver burden. Pt reports wanting to work on toileting hygiene skills prior to DC. Donned shirt with min A to thread LUE.  Total A w/c transport to and from gym for time management. Pt completing short ambulatory transfers throughout session with CGA and use of RW and L saddle splint. Pt able to retrieve resistive clothes pins from his L/R side on mat table behind him as well as pins placed around the hem of his shirt with use of RW/L saddle splint with no overt LOB. Completed ambulatory toilet transfer in room with CGA and RW and able to simulate all toileting  tasks with CGA to min A for balance. Required cues for safe / energy conserving set-up of environment, but pt reports he feels confident in being able to complete hygiene post BM.  Seated at [facility] , pt completed the follow game x2 to target LUE NMR:  -Single User Target: 60% accuracy, 3.27 reaction time, 36 hits then progressing to 95.24% accuracy, 2.99 reaction time, and 60 hits; intermittent cues to extend elbow vs compensating with trunk to reach target.  Pt left seated in w/c with chair pad alarm engaged, call bell in reach, and all immediate needs met.    Therapy Documentation Precautions:  Precautions Precautions: Fall Precaution Comments: L inattention/visual field cut, L hemi Restrictions Weight Bearing Restrictions: No  Pain:  No c/o ADL: See Care Tool for more details. Therapy/Group: Individual Therapy  Volanda Napoleon MS, OTR/L  01/06/2021, 6:56 AM

## 2021-01-06 NOTE — Progress Notes (Signed)
Reviewed results below. Ok for patient to resume usual activity and therapy sessions.   Lower Venous DVT Study   Patient Name:  Joel Peck  Date of Exam:   01/06/2021  Medical Rec #: [id number]       Accession #:    [id number]  Date of Birth: [birth year]       Patient Gender: M  Patient Age:   86 years  Exam Location:  [facility]  Procedure:      VAS [place] LOWER EXTREMITY VENOUS (DVT)  Referring Phys: Leeroy Cha    ---------------------------------------------------------------------------  -----     Indications: Swelling.     Risk Factors: None identified.  Comparison Study: No prior studies.   Performing Technologist: Oliver Hum RVT      Examination Guidelines:  A complete evaluation includes B-mode imaging, spectral Doppler, color  Doppler,  and power Doppler as needed of all accessible portions of each vessel.  Bilateral  testing is considered an integral part of a complete examination. Limited  examinations for reoccurring indications may be performed as noted. The  reflux  portion of the exam is performed with the patient in reverse  Trendelenburg.       +-----+---------------+---------+-----------+----------+--------------+   RIGHT Compressibility Phasicity Spontaneity Properties Thrombus Aging   +-----+---------------+---------+-----------+----------+--------------+   CFV   Full            Yes       Yes                                     +-----+---------------+---------+-----------+----------+--------------+           +---------+---------------+---------+-----------+----------+--------------+    LEFT      Compressibility Phasicity Spontaneity Properties Thrombus  Aging   +---------+---------------+---------+-----------+----------+--------------+    CFV       Full            Yes       Yes                                       +---------+---------------+---------+-----------+----------+--------------+    SFJ       Full                                                                 +---------+---------------+---------+-----------+----------+--------------+    FV Prox   Full                                                                +---------+---------------+---------+-----------+----------+--------------+    FV Mid    Full                                                                +---------+---------------+---------+-----------+----------+--------------+  FV Distal Full                                                                +---------+---------------+---------+-----------+----------+--------------+    PFV       Full                                                                +---------+---------------+---------+-----------+----------+--------------+    POP       Full            Yes       Yes                                       +---------+---------------+---------+-----------+----------+--------------+    PTV       Partial                                          Acute             +---------+---------------+---------+-----------+----------+--------------+    PERO      Full                                                                +---------+---------------+---------+-----------+----------+--------------+    Summary:  RIGHT:  - No evidence of common femoral vein obstruction.     LEFT:  - Findings consistent with acute deep vein thrombosis involving the left  posterior tibial veins.  - No cystic structure found in the popliteal fossa.      *See table(s) above for measurements and observations.     Preliminary

## 2021-01-06 NOTE — Progress Notes (Signed)
PROGRESS NOTE   Subjective/Complaints: No new complaints this morning Feels swelling has improved. Has not yet been taken for ultrasound. Feels cough has improved.   ROS:  Pt denies SOB, abd pain, CP, N/V/C/D, and vision changes, +left foot third digit intermittent burning, cough improved  Objective:   No results found. No results for input(s): WBC, HGB, HCT, PLT in the last 72 hours.  No results for input(s): NA, K, CL, CO2, GLUCOSE, BUN, CREATININE, CALCIUM in the last 72 hours.    Intake/Output Summary (Last 24 hours) at 01/06/2021 0947 Last data filed at 01/06/2021 0734 Gross per 24 hour  Intake 840 ml  Output 1125 ml  Net -285 ml        Physical Exam: Vital Signs Blood pressure (!) 143/67, pulse 66, temperature 98.2 F (36.8 C), resp. rate 18, height 5\' 7"  (1.702 m), weight 77.1 kg, SpO2 99 %. Gen: no distress, normal appearing HEENT: oral mucosa pink and moist, NCAT Cardio: Reg rate Chest: normal effort, normal rate of breathing Abd: soft, non-distended Ext: LLE edema Psych: pleasant and cooperative  Skin: Clean and intact without signs of breakdown Neuro: oriented and alert. Reasonable insight and awareness. No CN signs. Left EF and EE 3/5 to 2/5 wrist,HI. LLE 3- HE, KE and 0/5 at ankle. Senses pain and light touch.  Musculoskeletal: normal rom. L foot 1+ edema- mild  Assessment/Plan: 1. Functional deficits which require 3+ hours per day of interdisciplinary therapy in a comprehensive inpatient rehab setting. Physiatrist is providing close team supervision and 24 hour management of active medical problems listed below. Physiatrist and rehab team continue to assess barriers to discharge/monitor patient progress toward functional and medical goals  Care Tool:  Bathing    Body parts bathed by patient: Right lower leg, Left lower leg, Right arm, Left arm, Chest, Abdomen, Front perineal area, Buttocks, Right  upper leg, Left upper leg, Face   Body parts bathed by helper: Right arm     Bathing assist Assist Level: Minimal Assistance - Patient > 75%     Upper Body Dressing/Undressing Upper body dressing Upper body dressing/undressing activity did not occur (including orthotics): Safety/medical concerns What is the patient wearing?: Pull over shirt    Upper body assist Assist Level: Minimal Assistance - Patient > 75%    Lower Body Dressing/Undressing Lower body dressing    Lower body dressing activity did not occur: Safety/medical concerns What is the patient wearing?: Pants, Incontinence brief     Lower body assist Assist for lower body dressing: Moderate Assistance - Patient 50 - 74%     Toileting Toileting    Toileting assist Assist for toileting: Minimal Assistance - Patient > 75%     Transfers Chair/bed transfer  Transfers assist     Chair/bed transfer assist level: Contact Guard/Touching assist Chair/bed transfer assistive device: Walker, Clinical biochemist   Ambulation assist      Assist level: Contact Guard/Touching assist Assistive device: Walker-rolling Max distance: 277ft   Walk 10 feet activity   Assist  Walk 10 feet activity did not occur: Safety/medical concerns  Assist level: Contact Guard/Touching assist Assistive device: Walker-rolling   Walk 50 feet activity  Assist Walk 50 feet with 2 turns activity did not occur: Safety/medical concerns  Assist level: Contact Guard/Touching assist Assistive device: Walker-rolling    Walk 150 feet activity   Assist Walk 150 feet activity did not occur: Safety/medical concerns  Assist level: Contact Guard/Touching assist Assistive device: Walker-rolling    Walk 10 feet on uneven surface  activity   Assist Walk 10 feet on uneven surfaces activity did not occur: Safety/medical concerns   Assist level: Minimal Assistance - Patient > 75% Assistive device: Walker-rolling    Wheelchair     Assist Is the patient using a wheelchair?: Yes Type of Wheelchair: Manual    Wheelchair assist level: Dependent - Patient 0% Max wheelchair distance: 174ft    Wheelchair 50 feet with 2 turns activity    Assist        Assist Level: Dependent - Patient 0%   Wheelchair 150 feet activity     Assist      Assist Level: Dependent - Patient 0%   Blood pressure (!) 143/67, pulse 66, temperature 98.2 F (36.8 C), resp. rate 18, height 5\' 7"  (1.702 m), weight 77.1 kg, SpO2 99 %.  Medical Problem List and Plan: 1. 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  Impaired mobility, ambulating 165 feet: may discontinue Lovenox, clear to shave.              -antiplatelet therapy: continue DAPT X 3 months followed by ASA alone.  3. Pain: d/c oxycodone 4. Insomnia: decrease trazodone to 50mg  HS               -antipsychotic agents: N/A    -continue to provide egosupport and positive reinforcement 5. Neuropsych: This patient is capable of making decisions on his own behalf. 6. Skin/Wound Care: Routine pressure relief measures.              --monitor right neck incision for healing/edema.  7. Fluids/Electrolytes/Nutrition: Monitor I/O. Check lytes in am.  8. HTN: Monitor BP TID with SBP goals 110-120 to avoid hypoperfusion.              --Continue Norvasc and metoprolol BID. Advised to check home BP cuff against our measurements to ensure his home machine is working properly.        Vitals:    12/21/20 1959 12/22/20 0456  BP: (!) 140/59 128/61  Pulse: 68 66  Resp: 18 18  Temp: 98.4 F (36.9 C) 98.4 F (36.9 C)  SpO2: 98% 97%  1/2- BP controlled- con't regimen 9. AKI: SCr has risen from 1.03-->1.30. Likely due to dye/hypotension             --Encourage  fluid intake. 12/21 recheck SCr improved to 0.9 10. Leucocytosis: Likely reactive due to surgery. Will monitor for signs of infection 11. ABLA: Likely due to thrombolytics/surgery with drop in  Hgb 14.3--->10.9---> 11.3 on 12/21 --Will monitor with serial CBC for trend as on DAPT/Lovenox  12. Hyperlipidemia: LDL 111. Continue Crestor.  13. BPH: Continue Flomax. Will check PVRs as reporting hesitancy.  14. Post CEA odynophagia/dysphagia: He is ordering appropriate foods. Will monitor for now.  --he does not want diet adjusted at this  time 15. Insomnia: Will increase melatonin to 5 mg. Family to spend night for support.  16. Chronic constipation-     -had large bm 12/22  -continue current mom and colace in pm  1/2- LBM this AM- "not finished". Needs to go again.  17. GERD- will increase Protonix- used Omeprazole at home.   18.  Odynophagia related to CEA -some crackles at right base on exam. CXR clear  -encourage IS, FV  Placed nursing order to bring him tea with honey daily.  19. Productive cough: continue robitussin. Add mucinex q12H 20. Prediabetes: placed nursing order to provide only water, no drinks with added sugar 21. Left foot third digit burning: add prn gabapentin 100mg  HS 22. Left lower extremity edema: ultrasound ordered 23. Left lower extremity foot drop: AFO ordered     LOS: 16 days A FACE TO FACE EVALUATION WAS PERFORMED  Clide Deutscher Vung Kush 01/06/2021, 9:47 AM

## 2021-01-06 NOTE — Plan of Care (Signed)
LTG upgraded due to progress  Problem: Sit to Stand Goal: LTG:  Patient will perform sit to stand with assistance level (PT) Description: LTG:  Patient will perform sit to stand with assistance level (PT) Flowsheets (Taken 01/06/2021 0751) LTG: PT will perform sit to stand in preparation for functional mobility with assistance level: Supervision/Verbal cueing   Problem: RH Ambulation Goal: LTG Patient will ambulate in controlled environment (PT) Description: LTG: Patient will ambulate in a controlled environment, # of feet with assistance (PT). Flowsheets (Taken 01/06/2021 0751) LTG: Pt will ambulate in controlled environ  assist needed:: Supervision/Verbal cueing LTG: Ambulation distance in controlled environment: 111ft Goal: LTG Patient will ambulate in home environment (PT) Description: LTG: Patient will ambulate in home environment, # of feet with assistance (PT). Flowsheets (Taken 01/06/2021 0751) LTG: Pt will ambulate in home environ  assist needed:: Supervision/Verbal cueing LTG: Ambulation distance in home environment: 43ft   Problem: RH Stairs Goal: LTG Patient will ambulate up and down stairs w/assist (PT) Description: LTG: Patient will ambulate up and down # of stairs with assistance (PT) Flowsheets (Taken 01/06/2021 0751) LTG: Pt will ambulate up/down stairs assist needed:: Contact Guard/Touching assist LTG: Pt will  ambulate up and down number of stairs: 4

## 2021-01-07 MED ORDER — MAGNESIUM HYDROXIDE 400 MG/5ML PO SUSP
30.0000 mL | Freq: Every evening | ORAL | Status: DC | PRN
  Administered 2021-01-08: 30 mL via ORAL
  Filled 2021-01-07: qty 30

## 2021-01-07 NOTE — Progress Notes (Signed)
Physical Therapy Session Note  Patient Details  Name: Joel Peck MRN: [id number] Date of Birth: September 23, 1935  Today's Date: 01/07/2021 PT Individual Time: 1415-1501 PT Individual Time Calculation (min): 46 min   Short Term Goals: Week 2:  PT Short Term Goal 1 (Week 2): Pt will complete bed mobility with CGA PT Short Term Goal 1 - Progress (Week 2): Not met PT Short Term Goal 2 (Week 2): Pt will complete bed<>chair transfers with CGA and LRAD PT Short Term Goal 2 - Progress (Week 2): Met PT Short Term Goal 3 (Week 2): Pt will ambulate 160f with CGA and LRAD PT Short Term Goal 3 - Progress (Week 2): Met PT Short Term Goal 4 (Week 2): Pt will navigate 6inch curb with minA and LRAD PT Short Term Goal 4 - Progress (Week 2): Met PT Short Term Goal 5 (Week 2): Family education will be initiated PT Short Term Goal 5 - Progress (Week 2): Partly met Week 3:  PT Short Term Goal 1 (Week 3): STG = LTG due to ELOS  Skilled Therapeutic Interventions/Progress Updates:  Patient seated upright in w/c on entrance to room. Dtr and two sons present in room for family education. Patient alert, agreeable to PT session, and presence of PT student. While seated in w/c, dtr educated on donning/ doffing of pt's L shoe with AFO. She completes with minimal cueing and pt relating comfort once donned.   Patient with no pain complaint throughout session.  Therapeutic Activity: Bed Mobility: In ADL apartment bed, pt demonstrates supine <> sit with supervision and no use of bedrail. No vc required for technique.  Transfers: Patient performed sit<>stand and stand pivot transfers throughout session with supervision. Provided verbal cues to family for positioning to pt's L side and assist for L hand positioning to saddle splint on walker only of pt requires it. Otherwise just be present to pt's L side if pt demos weakness.  Car transfer performed with son providing CGA/ supervision. Pt does not require cues. Cues  provided as education for family in RW placement/ removal and handholds for pt.   Gait Training:  Patient ambulated from room to main therapy gym using RW and with dtr, then son, providing CGA. Student providing w/c follow for fatigue.  Provided vc/ tc for increasing LLE step height.  Pt performs step up onto 5" curb using RW and dtr, then son both providing CGA. Pt completes correctly with minimal cueing - cueing provided to family for education by pt and therapist.   Student providing appropriate answers to family questions and providing appropriate education throughout session with family.   Patient seated upright  in w/c at end of session with brakes locked, belt alarm set, and all needs within reach.     Therapy Documentation Precautions:  Precautions Precautions: Fall Precaution Comments: L inattention/visual field cut, L hemi Restrictions Weight Bearing Restrictions: No General:   Pain:  No pain complaint this session.  Therapy/Group: Individual Therapy  JAlger SimonsPT, DPT 01/07/2021, 6:40 PM

## 2021-01-07 NOTE — Progress Notes (Signed)
Occupational Therapy Session Note  Patient Details  Name: Joel Peck MRN: [id number] Date of Birth: [birth year]  Today's Date: 01/07/2021 OT Individual Time: 2233-6122 OT Individual Time Calculation (min): 42 min    Short Term Goals: Week 1:  OT Short Term Goal 1 (Week 1): Pt will demonstrate static standing balance with min assist for self care prep. OT Short Term Goal 1 - Progress (Week 1): Met OT Short Term Goal 2 (Week 1): Pt will use LUE as stabilizer with mod multimodal cues. OT Short Term Goal 2 - Progress (Week 1): Met OT Short Term Goal 3 (Week 1): Pt will donn shirt with min assist and min multimodal cues. OT Short Term Goal 3 - Progress (Week 1): Met OT Short Term Goal 4 (Week 1): Pt will complete toilet transfer with min assist. OT Short Term Goal 4 - Progress (Week 1): Met Week 2:  OT Short Term Goal 1 (Week 2): pt will complete UB dressing with CGA using hemi techniques as needed OT Short Term Goal 2 (Week 2): pt will complete 2/3 parts of toileting with MIN A OT Short Term Goal 3 (Week 2): pt will complete 1 grooming task in standing with CGA  Skilled Therapeutic Interventions/Progress Updates:  Patient met seated in wc in agreement with OT treatment session. 0/10 pain reported at rest and with activity. OT treatment session with focus on self-care re-education, functional mobility and LUE NMR. Patient able to [facility] hospital gown and don overhead shirt with supervision A. Good recall of hemi technique. Able to doff/don socks with assist to attain/maintain figure-4 position. Max A to don L AFO. Total A for wc transport to therapy gym for time management. In gym, patient ambulated 100+ft with RW and Min guard. Able to manage L walker splint without external assist. Focus then shifted to LUE NMR with clothes pins. Only able to manipulate yellow and red pins. Functional mobility back to hospital room with RW and Min guard. Session concluded with patient seated in wc with call bell  within reach, belt alarm activated and all needs met.   Therapy Documentation Precautions:  Precautions Precautions: Fall Precaution Comments: L inattention/visual field cut, L hemi Restrictions Weight Bearing Restrictions: No General:    Therapy/Group: Individual Therapy  Jamiere Gulas R [place]Davis 01/07/2021, 6:54 AM

## 2021-01-07 NOTE — Progress Notes (Signed)
Patient ID: Joel Peck, male   DOB: [birth year], 86 y.o.   MRN: [id number]  Met with pt, daughter and two son's who are here for family education in preparation of discharge 1/10. All very pleased with how well he is doing and quite surprised how well he is moving in therapies. Discussed home health therapies and his continued rehab.Also discussed OP once home health is exhausted or he has progressed to the point needs OP therapies. Aware pt will require 24/7 supervision at discharge from here. Aware of discharge 1/10. Has all equipment from past admissions.

## 2021-01-07 NOTE — Progress Notes (Signed)
PROGRESS NOTE   Subjective/Complaints: No new complaints this morning  Working with OT in gym Discussed his DVT- in a vein where it is not likely to move, will repeat US prior to d/c  ROS:  Pt denies SOB, abd pain, CP, N/V/C/D, and vision changes, +left foot third digit intermittent burning, cough improved  Objective:   VAS [place] LOWER EXTREMITY VENOUS (DVT)  Result Date: 01/06/2021  Lower Venous DVT Study Patient Name:  Joel Peck  Date of Exam:   01/06/2021 Medical Rec #: [id number]       Accession #:    [id number] Date of Birth: 07-11-1935       Patient Gender: M Patient Age:   86 years Exam Location:  [facility] Procedure:      VAS [place] LOWER EXTREMITY VENOUS (DVT) Referring Phys: Leeroy Cha --------------------------------------------------------------------------------  Indications: Swelling.  Risk Factors: None identified. Comparison Study: No prior studies. Performing Technologist: Oliver Hum RVT  Examination Guidelines: A complete evaluation includes B-mode imaging, spectral Doppler, color Doppler, and power Doppler as needed of all accessible portions of each vessel. Bilateral testing is considered an integral part of a complete examination. Limited examinations for reoccurring indications may be performed as noted. The reflux portion of the exam is performed with the patient in reverse Trendelenburg.  +-----+---------------+---------+-----------+----------+--------------+  RIGHT Compressibility Phasicity Spontaneity Properties Thrombus Aging  +-----+---------------+---------+-----------+----------+--------------+  CFV   Full            Yes       Yes                                    +-----+---------------+---------+-----------+----------+--------------+   +---------+---------------+---------+-----------+----------+--------------+  LEFT      Compressibility Phasicity Spontaneity Properties Thrombus Aging   +---------+---------------+---------+-----------+----------+--------------+  CFV       Full            Yes       Yes                                    +---------+---------------+---------+-----------+----------+--------------+  SFJ       Full                                                             +---------+---------------+---------+-----------+----------+--------------+  FV Prox   Full                                                             +---------+---------------+---------+-----------+----------+--------------+  FV Mid    Full                                                             +---------+---------------+---------+-----------+----------+--------------+  FV Distal Full                                                             +---------+---------------+---------+-----------+----------+--------------+  PFV       Full                                                             +---------+---------------+---------+-----------+----------+--------------+  POP       Full            Yes       Yes                                    +---------+---------------+---------+-----------+----------+--------------+  PTV       Partial                                          Acute           +---------+---------------+---------+-----------+----------+--------------+  PERO      Full                                                             +---------+---------------+---------+-----------+----------+--------------+    Summary: RIGHT: - No evidence of common femoral vein obstruction.  LEFT: - Findings consistent with acute deep vein thrombosis involving the left posterior tibial veins. - No cystic structure found in the popliteal fossa.  *See table(s) above for measurements and observations. Electronically signed by Jamelle Haring on 01/06/2021 at 4:06:25 PM.    Final    No results for input(s): WBC, HGB, HCT, PLT in the last 72 hours.  No results for input(s): NA, K, CL, CO2, GLUCOSE, BUN, CREATININE, CALCIUM  in the last 72 hours.    Intake/Output Summary (Last 24 hours) at 01/07/2021 0829 Last data filed at 01/07/2021 0645 Gross per 24 hour  Intake 840 ml  Output 1050 ml  Net -210 ml        Physical Exam: Vital Signs Blood pressure 138/67, pulse 65, temperature 98.2 F (36.8 C), temperature source Oral, resp. rate 18, height 5\' 7"  (1.702 m), weight 77.1 kg, SpO2 99 %. Gen: no distress, normal appearing HEENT: oral mucosa pink and moist, NCAT Cardio: Reg rate Chest: normal effort, normal rate of breathing Abd: soft, non-distended Ext: LLE edema Psych: pleasant and cooperative  Skin: Clean and intact without signs of breakdown Neuro: oriented and alert. Reasonable insight and awareness. No CN signs. Left EF and EE 3/5 to 2/5 wrist,HI. LLE 3- HE, KE and 0/5 at ankle. Senses pain and light touch.  Musculoskeletal: normal rom. L foot 1+ edema- mild  Assessment/Plan: 1. Functional deficits which require 3+ hours per day of interdisciplinary therapy in a comprehensive inpatient rehab setting. Physiatrist is providing  close team supervision and 24 hour management of active medical problems listed below. Physiatrist and rehab team continue to assess barriers to discharge/monitor patient progress toward functional and medical goals  Care Tool:  Bathing    Body parts bathed by patient: Right lower leg, Left lower leg, Left arm, Chest, Abdomen, Front perineal area, Buttocks, Right upper leg, Left upper leg, Face   Body parts bathed by helper: Right arm     Bathing assist Assist Level: Minimal Assistance - Patient > 75%     Upper Body Dressing/Undressing Upper body dressing Upper body dressing/undressing activity did not occur (including orthotics): Safety/medical concerns What is the patient wearing?: Pull over shirt    Upper body assist Assist Level: Supervision/Verbal cueing    Lower Body Dressing/Undressing Lower body dressing    Lower body dressing activity did not occur:  Safety/medical concerns What is the patient wearing?: Pants, Incontinence brief     Lower body assist Assist for lower body dressing: Moderate Assistance - Patient 50 - 74%     Toileting Toileting    Toileting assist Assist for toileting: Minimal Assistance - Patient > 75%     Transfers Chair/bed transfer  Transfers assist     Chair/bed transfer assist level: Contact Guard/Touching assist Chair/bed transfer assistive device: Walker, Clinical biochemist   Ambulation assist      Assist level: Contact Guard/Touching assist Assistive device: Walker-rolling Max distance: 10'   Walk 10 feet activity   Assist  Walk 10 feet activity did not occur: Safety/medical concerns  Assist level: Contact Guard/Touching assist Assistive device: Walker-rolling   Walk 50 feet activity   Assist Walk 50 feet with 2 turns activity did not occur: Safety/medical concerns  Assist level: Contact Guard/Touching assist Assistive device: Walker-rolling    Walk 150 feet activity   Assist Walk 150 feet activity did not occur: Safety/medical concerns  Assist level: Contact Guard/Touching assist Assistive device: Walker-rolling    Walk 10 feet on uneven surface  activity   Assist Walk 10 feet on uneven surfaces activity did not occur: Safety/medical concerns   Assist level: Minimal Assistance - Patient > 75% Assistive device: Walker-rolling   Wheelchair     Assist Is the patient using a wheelchair?: Yes Type of Wheelchair: Manual    Wheelchair assist level: Dependent - Patient 0% Max wheelchair distance: 127ft    Wheelchair 50 feet with 2 turns activity    Assist        Assist Level: Dependent - Patient 0%   Wheelchair 150 feet activity     Assist      Assist Level: Dependent - Patient 0%   Blood pressure 138/67, pulse 65, temperature 98.2 F (36.8 C), temperature source Oral, resp. rate 18, height 5\' 7"  (1.702 m), weight 77.1 kg,  SpO2 99 %.  Medical Problem List and Plan: 1. 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  DVT in left lower extremity in left posterior tibial vein. Discussed with patient, will repeat u/s on 1/10, ambulating 165 feet: may discontinue Lovenox, clear to shave.              -antiplatelet therapy: continue DAPT X 3  months followed by ASA alone.  3. Pain: d/c oxycodone 4. Insomnia: decrease trazodone to 50mg  HS               -antipsychotic agents: N/A    -continue to provide egosupport and positive reinforcement 5. Neuropsych: This patient is capable of making decisions on his own behalf. 6. Skin/Wound Care: Routine pressure relief measures.              --monitor right neck incision for healing/edema.  7. Fluids/Electrolytes/Nutrition: Monitor I/O. Check lytes in am.  8. HTN: Monitor BP TID with SBP goals 110-120 to avoid hypoperfusion.              --Continue Norvasc and metoprolol BID. Advised to check home BP cuff against our measurements to ensure his home machine is working properly.        Vitals:    12/21/20 1959 12/22/20 0456  BP: (!) 140/59 128/61  Pulse: 68 66  Resp: 18 18  Temp: 98.4 F (36.9 C) 98.4 F (36.9 C)  SpO2: 98% 97%   9. AKI: SCr has risen from 1.03-->1.30. Likely due to dye/hypotension             --Encourage fluid intake. 12/21 recheck SCr improved to 0.9 10. Leucocytosis: Likely reactive due to surgery. Will monitor for signs of infection 11. ABLA: Likely due to thrombolytics/surgery with drop in  Hgb 14.3--->10.9---> 11.3 on 12/21 --Will monitor with serial CBC for trend as on DAPT/Lovenox  12. Hyperlipidemia: LDL 111. Continue Crestor.  13. BPH: Continue Flomax. Will check PVRs as reporting hesitancy.  14. Post CEA odynophagia/dysphagia: He is  ordering appropriate foods. Will monitor for now.  --he does not want diet adjusted at this time 15. Insomnia: Will increase melatonin to 5 mg. Family to spend night for support.  16. Chronic constipation-  change milk of mag to PRN 17. GERD- will increase Protonix- used Omeprazole at home.   18.  Odynophagia related to CEA -some crackles at right base on exam. CXR clear  -encourage IS, FV  Placed nursing order to bring him tea with honey daily.  19. Productive cough: continue robitussin. Add mucinex q12H 20. Prediabetes: placed nursing order to provide only water, no drinks with added sugar 21. Left foot third digit burning: add prn gabapentin 100mg  HS 22. Left lower extremity edema: ultrasound ordered 23. Left lower extremity foot drop: AFO ordered     LOS: 17 days A FACE TO FACE EVALUATION WAS PERFORMED  Erven Ramson P Tilden Broz 01/07/2021, 8:29 AM

## 2021-01-07 NOTE — Progress Notes (Signed)
Occupational Therapy Session Note  Patient Details  Name: Joel Peck MRN: [id number] Date of Birth: 1935/12/08  Today's Date: 01/07/2021 OT Individual Time: 3220-2542 OT Individual Time Calculation (min): 72 min    Short Term Goals: Week 3:  OT Short Term Goal 1 (Week 3): STGs = LTGs  Skilled Therapeutic Interventions/Progress Updates:    Pt's family present for education this session.  Provided hands on education with all 3 family members including his son, daughter, and son-in-law.  They were all able to return demonstrate safe assist with mobility to and from the toilet with min assist using the RW for support.  Mod instructional cueing was given for scooting to the edge of the chair, positioning his feet, and then pushing up off of the surface.  AFO was in place on the LLE as well with education on donning and doffing.  Pt continues to need use of the left hand splint on the walker to maintain grip secondary to weakness and decreased attention.  Also provided education on dressing routine following hemi techniques for donning LB and UB clothing.  He was able to donn his pullover shirt per family at supervision level, prior to session.  He needed mod assist for donning left sock with max assist for donning the AFO.  Also provided education on therapeutic tasks for increasing function in the RUE.  He has small pieces of sponge to pick up and place in the cup with the LUE.  Also encouraged functional tasks such as folding towels or washcloths or working on pulling up his sleeve on the right arm and pushing it down.  Other tasks including picking up items off of the table with the LUE and holding them to open as well as trying to open them with the left hand as well.  Finished session with pt in the wheelchair with family present and call button and phone in reach.  PT coming in for next session.      Therapy Documentation Precautions:  Precautions Precautions: Fall Precaution Comments: L  inattention/visual field cut, L hemi Restrictions Weight Bearing Restrictions: No  Pain: Pain Assessment Pain Scale: Faces Pain Score: 0-No pain ADL: See Care Tool Section for some details of mobility and selfcare   Therapy/Group: Individual Therapy  Kessler Solly,[place] OTR/L 01/07/2021, 3:47 PM

## 2021-01-07 NOTE — Progress Notes (Signed)
Occupational Therapy Weekly Progress Note  Patient Details  Name: Joel Peck MRN: [id number] Date of Birth: 1935-05-06  Beginning of progress report period: December 30, 2020 End of progress report period: January 07, 2021  Today's Date: 01/07/2021 OT Individual Time: 1015-1100 OT Individual Time Calculation (min): 45 min    Patient has met 3 of 3 short term goals.  Pt has been making excellent progress with functional use of his LUE, Left side awareness, balance and use of hemidressing techniques to reach a more independent level of self care.    Patient continues to demonstrate the following deficits: unbalanced muscle activation and decreased coordination, decreased attention to left, and decreased standing balance and hemiplegia and therefore will continue to benefit from skilled OT intervention to enhance overall performance with BADL.  Patient progressing toward long term goals..  Continue plan of care.  OT Short Term Goals Week 1:  OT Short Term Goal 1 (Week 1): Pt will demonstrate static standing balance with min assist for self care prep. OT Short Term Goal 1 - Progress (Week 1): Met OT Short Term Goal 2 (Week 1): Pt will use LUE as stabilizer with mod multimodal cues. OT Short Term Goal 2 - Progress (Week 1): Met OT Short Term Goal 3 (Week 1): Pt will donn shirt with min assist and min multimodal cues. OT Short Term Goal 3 - Progress (Week 1): Met OT Short Term Goal 4 (Week 1): Pt will complete toilet transfer with min assist. OT Short Term Goal 4 - Progress (Week 1): Met Week 2:  OT Short Term Goal 1 (Week 2): pt will complete UB dressing with CGA using hemi techniques as needed OT Short Term Goal 1 - Progress (Week 2): Met OT Short Term Goal 2 (Week 2): pt will complete 2/3 parts of toileting with MIN A OT Short Term Goal 2 - Progress (Week 2): Met OT Short Term Goal 3 (Week 2): pt will complete 1 grooming task in standing with CGA OT Short Term Goal 3 - Progress (Week  2): Met Week 3:  OT Short Term Goal 1 (Week 3): STGs = LTGs  Skilled Therapeutic Interventions/Progress Updates:    Pt received in wc and had completed ADLs in earlier session with other OT.  He is now CGA to light min A with most of his self care and on track to meeting LTGs. Pt seen this session to focus on LUE NMR.  Pt taken to gym via wc and he completed stand pivot to mat with CGA.   -PROM to scapula for increased gliding prior to sh ex.  -Using bar stool, pt worked on grasping handle and pushing back and forth to rock stool. And then pushing stool away against therapist resistance -holding hula hoop in B hands and rotating like a steering wheel -hula hoop around torso for trunk rotation with L sh active ROM -grasping large therapy ball with numerous sit to stands with legs only with Supervision! -reaching to pick ball up off of floor with B hands in sitting and then once lifting ball rising to stand  He did extremely well today demonstrating improved balance, postural control. Stand pivot back to wc. Pt opted to sit in wc to rest.  Belt alarm on and all needs met.   Therapy Documentation Precautions:  Precautions Precautions: Fall Precaution Comments: L inattention/visual field cut, L hemi Restrictions Weight Bearing Restrictions: No   Pain: no c/o pain       Therapy/Group: Individual Therapy  [place] 01/07/2021, 1:11 PM

## 2021-01-09 NOTE — Progress Notes (Signed)
Physical Therapy Session Note  Patient Details  Name: Joel Peck MRN: [id number] Date of Birth: 1935-08-17  Today's Date: 01/09/2021 PT Individual Time: 1410-1504 PT Individual Time Calculation (min): 54 min   Short Term Goals:   Week 3:  PT Short Term Goal 1 (Week 3): STG = LTG due to ELOS  Skilled Therapeutic Interventions/Progress Updates:  Pt resting in wc;  he denied pain but stated that he needed to use toilet for BM.  Toilet transfer using RW, CG.  Pt managed clothes with CGA.  Continent of B and B.  Pt performed peri care using R hand, with PT checking for cleanliness at finish.  neuromuscular re-education via forced use, multimodal cues for alternating reciprocal movement bil LEs, using Kinetron in sitting in wc, resistance 50 cm/sec.  Pt positioned in trunk flexion to target gluteal muscles and cued to decrease compensatory trunk action, x 25 x 2.  Seated in wc- L knee flexion/extension, isolated, using visual target of cone, and L hip abduction/adduction using same visual target. Seated- manual contacts on L PSIS to facilitate L trunk protraction, x 5  x3 with good activation.  Sit>< stand with close supervision, without cues.  Gait training on level tile, RW, with multiple turns, x 180', close supervision. Pt lacks trunk rotation during gait.  Gait up/down 6" high curb, RW, CG with cue for sequencing on 1st trial, no cues 2nd trial.  Gait x 180' at end of session iwht supervision, RW, level tile, with improved pelvic rotation noted after neuro re-ed.  At end of session, pt in wc with needs at hand and seat pad alarm set.     Therapy Documentation Precautions:  Precautions Precautions: Fall Precaution Comments: L inattention/visual field cut, L hemi Restrictions Weight Bearing Restrictions: No        Therapy/Group: Individual Therapy  Annjanette Wertenberger 01/09/2021, 4:22 PM

## 2021-01-09 NOTE — Progress Notes (Signed)
PROGRESS NOTE   Subjective/Complaints:  Pt asking/verifying U/S is going to be done prior to d/c.  Asking how gets meds at d/c- went over process either send Rx's to his pharmacy or does transitions of care-  Daughters asking about d/c plans- reinforced and educated on the DVT,  meds and plan about d/c.   ROS:   Pt denies SOB, abd pain, CP, N/V/C/D, and vision changes  Objective:   No results found. No results for input(s): WBC, HGB, HCT, PLT in the last 72 hours.  No results for input(s): NA, K, CL, CO2, GLUCOSE, BUN, CREATININE, CALCIUM in the last 72 hours.    Intake/Output Summary (Last 24 hours) at 01/09/2021 1217 Last data filed at 01/09/2021 0730 Gross per 24 hour  Intake 1080 ml  Output 200 ml  Net 880 ml        Physical Exam: Vital Signs Blood pressure (!) 153/63, pulse 72, temperature 97.8 F (36.6 C), temperature source Oral, resp. rate 16, height 5\' 7"  (1.702 m), weight 77.1 kg, SpO2 99 %.    General: awake, alert, appropriate, daughters at bedside; had a lot of questions - answered; NAD HENT: conjugate gaze; oropharynx moist CV: regular rate; no JVD Pulmonary: CTA B/L; no W/R/R- good air movement GI: soft, NT, ND, (+)BS Psychiatric: appropriate Neurological: Ox3- sitting up in bedside chair-  Skin: Clean and intact without signs of breakdown Neuro: oriented and alert. Reasonable insight and awareness. No CN signs. Left EF and EE 3/5 to 2/5 wrist,HI. LLE 3- HE, KE and 0/5 at ankle. Senses pain and light touch.  Musculoskeletal: normal rom. L foot 1+ edema- mild- no change  Assessment/Plan: 1. Functional deficits which require 3+ hours per day of interdisciplinary therapy in a comprehensive inpatient rehab setting. Physiatrist is providing close team supervision and 24 hour management of active medical problems listed below. Physiatrist and rehab team continue to assess barriers to discharge/monitor  patient progress toward functional and medical goals  Care Tool:  Bathing    Body parts bathed by patient: Left arm, Chest, Abdomen, Front perineal area, Buttocks, Right upper leg, Left upper leg, Right lower leg, Left lower leg, Face   Body parts bathed by helper: Right arm     Bathing assist Assist Level: Minimal Assistance - Patient > 75%     Upper Body Dressing/Undressing Upper body dressing Upper body dressing/undressing activity did not occur (including orthotics): Safety/medical concerns What is the patient wearing?: Pull over shirt    Upper body assist Assist Level: Supervision/Verbal cueing    Lower Body Dressing/Undressing Lower body dressing    Lower body dressing activity did not occur: Safety/medical concerns What is the patient wearing?: Pants, Incontinence brief     Lower body assist Assist for lower body dressing: Minimal Assistance - Patient > 75%     Toileting Toileting    Toileting assist Assist for toileting: Minimal Assistance - Patient > 75%     Transfers Chair/bed transfer  Transfers assist     Chair/bed transfer assist level: Contact Guard/Touching assist Chair/bed transfer assistive device: Walker, Clinical biochemist   Ambulation assist      Assist level: Contact Guard/Touching assist  Assistive device: Walker-rolling Max distance: 10'   Walk 10 feet activity   Assist  Walk 10 feet activity did not occur: Safety/medical concerns  Assist level: Contact Guard/Touching assist Assistive device: Walker-rolling   Walk 50 feet activity   Assist Walk 50 feet with 2 turns activity did not occur: Safety/medical concerns  Assist level: Contact Guard/Touching assist Assistive device: Walker-rolling    Walk 150 feet activity   Assist Walk 150 feet activity did not occur: Safety/medical concerns  Assist level: Contact Guard/Touching assist Assistive device: Walker-rolling    Walk 10 feet on uneven surface   activity   Assist Walk 10 feet on uneven surfaces activity did not occur: Safety/medical concerns   Assist level: Minimal Assistance - Patient > 75% Assistive device: Walker-rolling   Wheelchair     Assist Is the patient using a wheelchair?: Yes Type of Wheelchair: Manual    Wheelchair assist level: Dependent - Patient 0% Max wheelchair distance: 129ft    Wheelchair 50 feet with 2 turns activity    Assist        Assist Level: Dependent - Patient 0%   Wheelchair 150 feet activity     Assist      Assist Level: Dependent - Patient 0%   Blood pressure (!) 153/63, pulse 72, temperature 97.8 F (36.6 C), temperature source Oral, resp. rate 16, height 5\' 7"  (1.702 m), weight 77.1 kg, SpO2 99 %.  Medical Problem List and Plan: 1. 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  DVT in left lower extremity in left posterior tibial vein. Discussed with patient, will repeat u/s on 1/10, ambulating 165 feet: may discontinue Lovenox, clear to shave. 1/8- verified pt needs U/S prior to d/c with pt- primary doctor here to order prior to d/c.               -antiplatelet therapy: continue DAPT X 3 months followed by ASA alone.  3. Pain: d/c oxycodone 4. Insomnia: decrease trazodone to 50mg  HS               -antipsychotic agents: N/A    -continue to provide egosupport and positive reinforcement 5. Neuropsych: This patient is capable of making decisions on his own behalf. 6. Skin/Wound Care: Routine pressure relief measures.              --monitor right neck incision for healing/edema.  7. Fluids/Electrolytes/Nutrition: Monitor I/O. Check lytes in am.  8. HTN: Monitor BP TID with SBP goals 110-120 to avoid hypoperfusion.              --Continue Norvasc and metoprolol  BID  Advised to check home BP cuff against our measurements to ensure his home machine is working properly.  1/8- BP a little elevated today, but isn't the trend- con't regimen       Vitals:    12/21/20 1959 12/22/20 0456  BP: (!) 140/59 128/61  Pulse: 68 66  Resp: 18 18  Temp: 98.4 F (36.9 C) 98.4 F (36.9 C)  SpO2: 98% 97%   9. AKI: SCr has risen from 1.03-->1.30. Likely due to dye/hypotension             --  Encourage fluid intake. 12/21 recheck SCr improved to 0.9 10. Leucocytosis: Likely reactive due to surgery. Will monitor for signs of infection 11. ABLA: Likely due to thrombolytics/surgery with drop in  Hgb 14.3--->10.9---> 11.3 on 12/21 --Will monitor with serial CBC for trend as on DAPT/Lovenox  12. Hyperlipidemia: LDL 111. Continue Crestor.  13. BPH: Continue Flomax. Will check PVRs as reporting hesitancy.  14. Post CEA odynophagia/dysphagia: He is ordering appropriate foods. Will monitor for now.  --he does not want diet adjusted at this time 15. Insomnia: Will increase melatonin to 5 mg. Family to spend night for support.   1/8- family here for support whenever he needs them 16. Chronic constipation-  change milk of mag to PRN 17. GERD- will increase Protonix- used Omeprazole at home.   18.  Odynophagia related to CEA -some crackles at right base on exam. CXR clear  -encourage IS, FV  Placed nursing order to bring him tea with honey daily.   1/8- on Regular/thin diet- con't regimen 19. Productive cough: continue robitussin. Add mucinex q12H 20. Prediabetes: placed nursing order to provide only water, no drinks with added sugar 21. Left foot third digit burning: add prn gabapentin 100mg  HS 22. Left lower extremity edema: ultrasound ordered  1/8- Posterior tibial clot- to recheck prior to d/c.  23. Left lower extremity foot drop: AFO ordered     LOS: 19 days A FACE TO FACE EVALUATION WAS PERFORMED  Kourosh Jablonsky 01/09/2021, 12:17 PM

## 2021-01-09 NOTE — Progress Notes (Signed)
Occupational Therapy Session Note  Patient Details  Name: Joel Peck MRN: [id number] Date of Birth: 08/24/1935  Today's Date: 01/09/2021 OT Individual Time: 8366-2947 OT Individual Time Calculation (min): 70 min    Short Term Goals: Week 3:  OT Short Term Goal 1 (Week 3): STGs = LTGs  Skilled Therapeutic Interventions/Progress Updates:   Pt resting in w/c upon arrival and agreeable to therapy. OT intervention with focus on functional amb with RW, standing balance, BADL retraining including bathing at shower level and dressing with sit<>stand from EOB, attention to Lt, and LUE function to increase indepenence with BADLs. Amb with RW to bathroom with CGA. Pt independently places Lt hand in RW splint and fastens. Pt requires assistance bathing RUE but otherwise completes all other bathing tasks with supervisoin. Sit<>stand and standing balance using bar with supervision. Pt returned to EOB to complete dressing. Pt required assitance fastening incontinence brief and donning socks. Completed all other tasks with supervision. Table tasks with small sponge cubes and theraputty with focus on LUE function (picking up/releasing cubes, elbow extension to reach for cubes when OTA held cubes in front of pt, and grip strength with theraputty. Pt remained in w/c with all needs within reach, arm trough in place, and seat alarm activated.   Therapy Documentation Precautions:  Precautions Precautions: Fall Precaution Comments: L inattention/visual field cut, L hemi Restrictions Weight Bearing Restrictions: No Pain: Pain Assessment Pain Scale: 0-10 Pain Score: 0-No pain   Therapy/Group: Individual Therapy  Leroy Libman 01/09/2021, 9:26 AM

## 2021-01-10 ENCOUNTER — Inpatient Hospital Stay (HOSPITAL_COMMUNITY): Payer: PPO

## 2021-01-10 DIAGNOSIS — I82402 Acute embolism and thrombosis of unspecified deep veins of left lower extremity: Secondary | ICD-10-CM

## 2021-01-10 LAB — BASIC METABOLIC PANEL
Anion gap: 6 (ref 5–15)
BUN: 6 mg/dL — ABNORMAL LOW (ref 8–23)
CO2: 28 mmol/L (ref 22–32)
Calcium: 9 mg/dL (ref 8.9–10.3)
Chloride: 102 mmol/L (ref 98–111)
Creatinine, Ser: 0.89 mg/dL (ref 0.61–1.24)
GFR, Estimated: >60 mL/min (ref >60)
Glucose, Bld: 96 mg/dL (ref 70–99)
Potassium: 3.8 mmol/L (ref 3.5–5.1)
Sodium: 136 mmol/L (ref 135–145)

## 2021-01-10 MED ORDER — POTASSIUM CHLORIDE 20 MEQ PO PACK
20.0000 meq | PACK | Freq: Once | ORAL | Status: AC
  Administered 2021-01-10: 20 meq via ORAL
  Filled 2021-01-10: qty 1

## 2021-01-10 NOTE — Progress Notes (Signed)
Physical Therapy Session Note  Patient Details  Name: Joel Peck MRN: [id number] Date of Birth: 1935-10-04  Today's Date: 01/10/2021 PT Individual Time: 1015-1110 PT Individual Time Calculation (min): 55 min   Short Term Goals: Week 3:  PT Short Term Goal 1 (Week 3): STG = LTG due to ELOS  Skilled Therapeutic Interventions/Progress Updates:     NT assisting with toileting on PT arrival. Direct handoff of care to PT as pt agreeable to treatment. Denies pain. Sit<>Stand to RW with supervision from w/c. Ambulated ~116f with supervision and RW to main rehab gym to practice stair training. Able to navigate up/down x12, 6inch, steps with 2 hand rails and CGA - pt able to recall proper sequencing (R foot leading ascent, L Foot leading descent). After seated rest, ambulated with supervision and RW to ortho rehab gym, ~1536fwith VC for safety awareness while turning. Practiced car transfer with car height simulating standard height sedan - completed with setupA. Able to ambulate up/down ~1042famp with CGA and RW with cues needed for keeping body close to RW while navigating threshold but no formal LOB noted. Practiced bed mobility on flat mat table - sit<>supine with supervision and rolling L<>R with supervision. VC for incorporating LUE into rolling to assist with trunk management and reducing L shoulder injury. Practiced donning/doffing AFO where he requires maxA for donning and supervision for doffing - discussed using family at home to assist with this which he voiced understanding. Also spent time completed AFO education, fall prevention, and energy conservation strategies. Pt ambulated back to his room, ~250f40fith supervision and RW and completed session seated in w/c with chair alarm on, all needs met.   Therapy Documentation Precautions:  Precautions Precautions: Fall Precaution Comments: L inattention/visual field cut, L hemi Restrictions Weight Bearing Restrictions: No General:     Therapy/Group: Individual Therapy  Joel Peck/2023, 7:31 AM

## 2021-01-10 NOTE — Progress Notes (Signed)
Inpatient Rehabilitation Care Coordinator Discharge Note   Patient Details  Name: Joel Peck MRN: [id number] Date of Birth: [birth year]   Discharge location: [employer] [facility]/7 [facility]  Length of Stay: 21 DAYS   Discharge activity level: [place]  Home/community participation: ACTIVE  Patient response QD:UKRCVK Literacy - How often do you need to have someone help you when you read instructions, pamphlets, or other written material from your doctor or pharmacy?: Never  Patient response FM:MCRFVO Isolation - How often do you feel lonely or isolated from those around you?: Never  Services provided included: MD, RD, PT, OT, SLP, RN, CM, TR, Pharmacy, SW  Financial Services:  Financial Services Utilized: [place] offered to/list presented to: PT AND DAUGHTER  Follow-up services arranged:  Home Health, Patient/Family has no preference for HH/DME agencies [place]  PT, OT, RN PT HAS NEEDED EQUIPMENT FROM PAST HOSPITALIZATIONS        Patient response to transportation need: Is the patient able to respond to transportation needs?: Yes In the past 12 months, has lack of transportation kept you from medical appointments or from getting medications?: No In the past 12 months, has lack of transportation kept you from meetings, work, or from getting things needed for daily living?: No    Comments (or additional information):CHILDREN WERE HERE FOR [place]. ALL VERY PLEASED WITH HIS PROGRESS WHILE HERE AND FEEL READY TO GO HOME  Patient/Family verbalized understanding of follow-up arrangements:  Yes  Individual responsible for coordination of the follow-up plan: [facility] [phone]  Confirmed correct DME delivered: Elease Hashimoto 01/10/2021    Elease Hashimoto

## 2021-01-10 NOTE — Progress Notes (Signed)
LLE venous duplex has been completed.  Results can be found under chart review under CV PROC. 01/10/2021 4:01 PM Jarae Panas RVT, RDMS

## 2021-01-10 NOTE — Discharge Summary (Signed)
Physical Therapy Discharge Summary  Patient Details  Name: Joel Peck MRN: [id number] Date of Birth: 10/13/1935  Patient has met 8 of 8 long term goals due to improved activity tolerance, improved balance, improved postural control, increased strength, ability to compensate for deficits, functional use of  left upper extremity and left lower extremity, improved attention, and improved awareness.  Patient to discharge at an ambulatory level Supervision.   Patient's care partner is independent to provide the necessary physical and cognitive assistance at discharge. Family education completed on Friday, 01/07/21, to ensure safe transition home.   Reasons goals not met: n/a  Recommendation:  Patient will benefit from ongoing skilled PT services in outpatient setting to continue to advance safe functional mobility, address ongoing impairments in LLE NMR, dynamic standing balance, gait, safety training and minimize fall risk.  Equipment: RW, L AFO ([place] walk-on)  Reasons for discharge: treatment goals met and discharge from hospital  Patient/family agrees with progress made and goals achieved: Yes  PT Discharge Precautions/Restrictions Precautions Precautions: Fall Precaution Comments: L hemi, L AFO Restrictions Weight Bearing Restrictions: No Pain Pain Assessment Pain Scale: 0-10 Pain Score: 0-No pain Pain Interference Pain Interference Pain Effect on Sleep: 2. Occasionally Pain Interference with Therapy Activities: 1. Rarely or not at all Pain Interference with Day-to-Day Activities: 1. Rarely or not at all Vision/Perception  Vision - History Ability to See in Adequate Light: 0 Adequate Vision - Assessment Eye Alignment: Within Functional Limits Ocular Range of Motion: Within Functional Limits Alignment/Gaze Preference: Head turned;Gaze right Tracking/Visual Pursuits: Decreased smoothness of horizontal tracking;Decreased smoothness of vertical tracking;Requires cues, head  turns, or add eye shifts to track Perception Perception: Impaired Inattention/Neglect: Does not attend to left side of body (mild) Praxis Praxis: Impaired Praxis Impairment Details: Motor planning (mild)  Cognition Overall Cognitive Status: Within Functional Limits for tasks assessed Arousal/Alertness: Awake/alert Orientation Level: Oriented X4 Year: 2023 Month: January Day of Week: Correct Attention: Focused;Sustained Focused Attention: Appears intact Sustained Attention: Appears intact Memory: Appears intact Immediate Memory Recall: Sock;Blue;Bed Memory Recall Sock: Without Cue Memory Recall Blue: Without Cue Memory Recall Bed: Without Cue Awareness: Appears intact Problem Solving: Appears intact Problem Solving Impairment: Verbal basic;Functional basic Sequencing: Appears intact Sequencing Impairment: Functional complex;Verbal complex Safety/Judgment: Appears intact Sensation Sensation Light Touch: Appears Intact Hot/Cold: Appears Intact Proprioception: Impaired by gross assessment Stereognosis: Appears Intact Additional Comments: mild proprioceptive deficits in standing with LLE Coordination Gross Motor Movements are Fluid and Coordinated: No Coordination and Movement Description: L hemi, L inattention impacting functional movement patterns. Significnatly improved since date of evaluation Finger Nose Finger Test: Rue 7x in 10 sec, LUE 5x in 10 sec Motor  Motor Motor: Hemiplegia;Motor apraxia Motor - Discharge Observations: L hemi (LUE > LLE)  Mobility Bed Mobility Bed Mobility: Rolling Right;Right Sidelying to Sit;Supine to Sit;Sit to Supine Rolling Right: Supervision/verbal cueing Right Sidelying to Sit: Supervision/Verbal cueing Supine to Sit: Supervision/Verbal cueing Sit to Supine: Supervision/Verbal cueing Transfers Transfers: Sit to Stand;Stand to Sit;Stand Pivot Transfers;Squat Pivot Transfers Sit to Stand: Supervision/Verbal cueing Stand to Sit:  Supervision/Verbal cueing Stand Pivot Transfers: Supervision/Verbal cueing Stand Pivot Transfer Details: Verbal cues for precautions/safety;Verbal cues for safe use of DME/AE;Verbal cues for sequencing Transfer (Assistive device): Rolling walker Locomotion  Gait Ambulation: Yes Gait Assistance: Supervision/Verbal cueing Gait Distance (Feet): 200 Feet Assistive device: Rolling walker Gait Assistance Details: Verbal cues for safe use of DME/AE;Verbal cues for gait pattern;Verbal cues for precautions/safety Gait Gait: Yes Gait Pattern: Impaired Gait Pattern: Step-through pattern;Decreased step length -  left;Decreased stance time - right;Decreased dorsiflexion - left;Decreased hip/knee flexion - left;Decreased weight shift to left;Poor foot clearance - left;Trunk flexed Stairs / Additional Locomotion Stairs: Yes Stairs Assistance: Contact Guard/Touching assist Stair Management Technique: Two rails;Step to pattern Number of Stairs: 12 Ramp: Contact Guard/touching assist Curb: Minimal Assistance - Patient >75% Wheelchair Mobility Wheelchair Mobility: No  Trunk/Postural Assessment  Cervical Assessment Cervical Assessment: Exceptions to [facility] (forward head) Thoracic Assessment Thoracic Assessment: Exceptions to [facility] (rounded shoulders) Lumbar Assessment Lumbar Assessment: Exceptions to [facility] (posterior pelvic tilt) Postural Control Postural Control: Within Functional Limits  Balance Balance Balance Assessed: Yes Static Sitting Balance Static Sitting - Balance Support: No upper extremity supported;Feet supported Static Sitting - Level of Assistance: 7: Independent Static Standing Balance Static Standing - Balance Support: During functional activity;No upper extremity supported Static Standing - Level of Assistance: 5: Stand by assistance Dynamic Standing Balance Dynamic Standing - Balance Support: Bilateral upper extremity supported Dynamic Standing - Level of Assistance: 4: Min  assist Extremity Assessment  RUE Assessment RUE Assessment: Within Functional Limits LUE Assessment Passive Range of Motion (PROM) Comments: WNL within hemi shoulder precautions Active Range of Motion (AROM) Comments: sh flexion 50, sh abd 30, IR full AROM, ER to neutral, elb flex WFL, elb ext -10, pronation WFL, supination -10, wrist ext 10 degrees, finger flexion WFL, ext -20 General Strength Comments: Shoulder abduction/FF 2-/5, ER/IR 2+/5, elbow flex 4-/5, ext 4-/5, forearm sup 3-/5, wrist ext/flex 2/5, digital flex 2-/5 ext 1/5, digit flex 4/5, digit ext 2-/5 Brunstrum level for arm: Stage III Synergy is performed voluntarily Brunstrum level for hand: Stage III Synergies performed voluntarily RLE Assessment RLE Assessment: Within Functional Limits LLE Assessment LLE Assessment: Exceptions to [facility] LLE Strength LLE Overall Strength: Deficits Left Hip Flexion: 3-/5 Left Knee Flexion: 3/5 Left Knee Extension: 4-/5 Left Ankle Dorsiflexion: 3-/5   Bentlie Withem P Kashon Kraynak PT 01/10/2021, 12:52 PM

## 2021-01-10 NOTE — Progress Notes (Signed)
PROGRESS NOTE   Subjective/Complaints: No new complaints this morning.  Discussed repeating vascular ultrasound today in preparation for d/c tomorrow- appreciate Pam already ordering this.   ROS:  Pt denies SOB, abd pain, CP, N/V/C/D, and vision changes, +left foot third digit intermittent burning, cough improved, +insomnia  Objective:   No results found. No results for input(s): WBC, HGB, HCT, PLT in the last 72 hours.  Recent Labs    01/10/21 0520  NA 136  K 3.8  CL 102  CO2 28  GLUCOSE 96  BUN 6*  CREATININE 0.89  CALCIUM 9.0      Intake/Output Summary (Last 24 hours) at 01/10/2021 1105 Last data filed at 01/10/2021 0828 Gross per 24 hour  Intake 1200 ml  Output 450 ml  Net 750 ml        Physical Exam: Vital Signs Blood pressure (!) 155/64, pulse 64, temperature 99.1 F (37.3 C), resp. rate 16, height 5\' 7"  (1.702 m), weight 77.1 kg, SpO2 96 %. Gen: no distress, normal appearing HEENT: oral mucosa pink and moist, NCAT Cardio: Reg rate Chest: normal effort, normal rate of breathing Abd: soft, non-distended Ext: LLE edema Psych: pleasant and cooperative  Skin: Clean and intact without signs of breakdown Neuro: oriented and alert. Reasonable insight and awareness. No CN signs. Left EF and EE 3/5 to 2/5 wrist,HI. LLE 3- HE, KE and 0/5 at ankle. Senses pain and light touch.  Musculoskeletal: normal rom. L foot 1+ edema- mild  Assessment/Plan: 1. Functional deficits which require 3+ hours per day of interdisciplinary therapy in a comprehensive inpatient rehab setting. Physiatrist is providing close team supervision and 24 hour management of active medical problems listed below. Physiatrist and rehab team continue to assess barriers to discharge/monitor patient progress toward functional and medical goals  Care Tool:  Bathing    Body parts bathed by patient: Left arm, Chest, Abdomen, Front perineal area,  Buttocks, Right upper leg, Left upper leg, Right lower leg, Left lower leg, Face   Body parts bathed by helper: Right arm     Bathing assist Assist Level: Minimal Assistance - Patient > 75%     Upper Body Dressing/Undressing Upper body dressing Upper body dressing/undressing activity did not occur (including orthotics): Safety/medical concerns What is the patient wearing?: Pull over shirt    Upper body assist Assist Level: Supervision/Verbal cueing    Lower Body Dressing/Undressing Lower body dressing    Lower body dressing activity did not occur: Safety/medical concerns What is the patient wearing?: Pants, Incontinence brief     Lower body assist Assist for lower body dressing: Minimal Assistance - Patient > 75%     Toileting Toileting    Toileting assist Assist for toileting: Minimal Assistance - Patient > 75%     Transfers Chair/bed transfer  Transfers assist     Chair/bed transfer assist level: Supervision/Verbal cueing Chair/bed transfer assistive device: Programmer, multimedia   Ambulation assist      Assist level: Supervision/Verbal cueing Assistive device: Walker-rolling Max distance: 200   Walk 10 feet activity   Assist  Walk 10 feet activity did not occur: Safety/medical concerns  Assist level: Supervision/Verbal cueing Assistive device:  Walker-rolling   Walk 50 feet activity   Assist Walk 50 feet with 2 turns activity did not occur: Safety/medical concerns  Assist level: Supervision/Verbal cueing Assistive device: Walker-rolling    Walk 150 feet activity   Assist Walk 150 feet activity did not occur: Safety/medical concerns  Assist level: Supervision/Verbal cueing Assistive device: Walker-rolling    Walk 10 feet on uneven surface  activity   Assist Walk 10 feet on uneven surfaces activity did not occur: Safety/medical concerns   Assist level: Minimal Assistance - Patient > 75% Assistive device: Walker-rolling    Wheelchair     Assist Is the patient using a wheelchair?: No Type of Wheelchair: Manual    Wheelchair assist level: Dependent - Patient 0% Max wheelchair distance: 152ft    Wheelchair 50 feet with 2 turns activity    Assist        Assist Level: Dependent - Patient 0%   Wheelchair 150 feet activity     Assist      Assist Level: Dependent - Patient 0%   Blood pressure (!) 155/64, pulse 64, temperature 99.1 F (37.3 C), resp. rate 16, height 5\' 7"  (1.702 m), weight 77.1 kg, SpO2 96 %.  Medical Problem List and Plan: 1. 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  DVT in left lower extremity in left posterior tibial vein. Discussed with patient, will repeat u/s today and in 1 week, ambulating 165 feet: may discontinue Lovenox, clear to shave.             -antiplatelet therapy: continue DAPT X 3 months followed by ASA alone.  3. Pain: d/c oxycodone 4. Insomnia: decrease trazodone to 50mg  HS               -antipsychotic agents: N/A    -continue to provide egosupport and positive reinforcement 5. Neuropsych: This patient is capable of making decisions on his own behalf. 6. Skin/Wound Care: Routine pressure relief measures.              --monitor right neck incision for healing/edema.  7. Fluids/Electrolytes/Nutrition: Monitor I/O. Check lytes in am.  8. HTN: Monitor BP TID with SBP goals 110-120 to avoid hypoperfusion.              --Continue Norvasc and metoprolol BID. Advised to check home BP cuff against our measurements to ensure his home machine is working properly.        Vitals:    12/21/20 1959 12/22/20 0456  BP: (!) 140/59 128/61  Pulse: 68 66  Resp: 18 18  Temp: 98.4 F (36.9 C) 98.4 F (36.9 C)  SpO2: 98% 97%   9. AKI: SCr has risen from  1.03-->1.30. Likely due to dye/hypotension             --Encourage fluid intake. 12/21 recheck SCr improved to 0.9 10. Leucocytosis: Likely reactive due to surgery. Will monitor for signs of infection 11. ABLA: Likely due to thrombolytics/surgery with drop in  Hgb 14.3--->10.9---> 11.3 on 12/21 --Will monitor with serial CBC for trend as on DAPT/Lovenox  12. Hyperlipidemia: LDL 111. Continue Crestor.  13. BPH: Continue Flomax. Will check PVRs as reporting hesitancy.  14.  Post CEA odynophagia/dysphagia: He is ordering appropriate foods. Will monitor for now.  --he does not want diet adjusted at this time 15. Insomnia: Will increase melatonin to 5 mg. Family to spend night for support. Continue trazodone.  16. Chronic constipation-  change milk of mag to PRN 17. GERD- will increase Protonix- used Omeprazole at home.   18.  Odynophagia related to CEA -some crackles at right base on exam. CXR clear  -encourage IS, FV  Placed nursing order to bring him tea with honey daily.  19. Productive cough: continue robitussin. Add mucinex q12H 20. Prediabetes: placed nursing order to provide only water, no drinks with added sugar 21. Left foot third digit burning: add prn gabapentin 100mg  HS 22. Left lower extremity edema: ultrasound ordered 23. Left lower extremity foot drop: AFO ordered     LOS: 20 days A FACE TO FACE EVALUATION WAS PERFORMED  Joel Peck 01/10/2021, 11:05 AM

## 2021-01-10 NOTE — Progress Notes (Signed)
Occupational Therapy Discharge Summary  Patient Details  Name: Joel Peck MRN: [id number] Date of Birth: 1935/01/15  Today's Date: 01/10/2021 OT Individual Time: 0800-0902 OT Individual Time Calculation (min): 62 min   Session Note:  Pt completed shower and dressing during session.  He was able to ambulate to the shower at supervision level with use of the RW and hand splint on the left side.  He then completed undressing with supervision for UB and mod assist for LB.  Shower was completed at min guard assist overall with integration of a wash mit to allow for greater use of the LUE as well as a LH sponge for washing his lower legs and feet as well as his back.  He was able to complete use of the LUE for washing the RUE with supervision and self assist at the left elbow to get to the top of the right shoulder.  He was able to stand with min guard assist using the grab bars for support.  Once shower was completed he dried off at min guard level and transferred out to the wheelchair at supervision level with use of the RW.  Supervision with min instructional cueing for donning pullover shirt.  Mod assist for donning brief and pants secondary to initially trying to donn them over the RLE first.  He was able to stand and pull them up over his hips with min assist.  He needed mod assist for donning his socks as well as shoes and left AFO.  Finished session with education on coordination exercises handout that therapist issued.  Call button and phone in reach with safety belt alarm in place.    Patient has met 7 of 7 long term goals due to improved activity tolerance, improved balance, postural control, ability to compensate for deficits, functional use of  LEFT upper extremity, improved attention, improved awareness, and improved coordination.  Patient to discharge at [facility] Assist level.  Patient's care partner is independent to provide the necessary physical and cognitive assistance at discharge.     Reasons goals not met: NA  Recommendation:  Patient will benefit from ongoing skilled OT services in home health setting to continue to advance functional skills in the area of BADL and Reduce care partner burden.  Feel pt will continue to benefit from ongoing [place] to further progress ADL functional status to a modified independent level or greater as well as increasing LUE functional use to WNLs.    Equipment: No equipment provided  Reasons for discharge: treatment goals met and discharge from hospital  Patient/family agrees with progress made and goals achieved: Yes  OT Discharge Precautions/Restrictions  Precautions Precautions: Fall Precaution Comments: L hemi, L AFO Restrictions Weight Bearing Restrictions: No  Pain Pain Assessment Pain Scale: 0-10 Pain Score: 0-No pain ADL ADL Eating: Set up Where Assessed-Eating: Wheelchair Grooming: Setup Where Assessed-Grooming: Clinical biochemist Bathing: Supervision/safety Where Assessed-Upper Body Bathing: Chair, Multimedia programmer Lower Body Bathing: Contact guard Where Assessed-Lower Body Bathing: Chair, Administrator, sports Dressing: Supervision/safety Where Assessed-Upper Body Dressing: Wheelchair Lower Body Dressing: Moderate assistance Where Assessed-Lower Body Dressing: Wheelchair Toileting: Contact guard Where Assessed-Toileting: Bedside Commode Toilet Transfer: Close supervision Toilet Transfer Method: Counselling psychologist: Radiographer, therapeutic: Metallurgist Method: Optometrist: Facilities manager: Close supervision Social research officer, government Method: Heritage manager: Gaffer Baseline Vision/History: 1 Wears glasses Patient Visual Report: Peripheral vision impairment;Other (comment) (Pt reports difficulty seeing to the  left) Vision Assessment?: Yes Eye Alignment: Within Functional  Limits Ocular Range of Motion: Within Functional Limits Alignment/Gaze Preference: Within Defined Limits Tracking/Visual Pursuits: Decreased smoothness of horizontal tracking;Decreased smoothness of vertical tracking Convergence: Within functional limits Visual Fields: Left visual field deficit Perception  Perception: Impaired Inattention/Neglect: Does not attend to left side of body Praxis Praxis: Intact Praxis Impairment Details: Motor planning (mild) Cognition Overall Cognitive Status: Within Functional Limits for tasks assessed Arousal/Alertness: Awake/alert Orientation Level: Oriented X4 Year: 2023 Month: January Day of Week: Correct Attention: Focused;Sustained Focused Attention: Appears intact Sustained Attention: Appears intact Memory: Impaired Memory Impairment: Decreased short term memory Decreased Short Term Memory: Functional basic Immediate Memory Recall: Sock;Blue;Bed Memory Recall Sock: Without Cue Memory Recall Blue: Without Cue Memory Recall Bed: Without Cue Awareness: Appears intact Problem Solving: Appears intact Problem Solving Impairment: Verbal basic;Functional basic Sequencing: Appears intact Sequencing Impairment: Functional complex;Verbal complex Safety/Judgment: Appears intact Sensation Sensation Light Touch: Appears Intact Hot/Cold: Appears Intact Proprioception: Appears Intact Stereognosis: Not tested Additional Comments: LUE sensation intact with gross testing Coordination Gross Motor Movements are Fluid and Coordinated: No Fine Motor Movements are Fluid and Coordinated: No Coordination and Movement Description: Pt uses the LUE at a gross assist level with selfcare with min to mod assist to integrate at a diminshed level with bathing tasks. Finger Nose Finger Test: Rue 7x in 10 sec, LUE 5x in 10 sec Motor  Motor Motor: Hemiplegia Motor - Discharge Observations: Pt still with LUE and LLE hemiparesis Mobility  Bed Mobility Bed Mobility:  Rolling Right;Right Sidelying to Sit;Supine to Sit;Sit to Supine Rolling Right: Supervision/verbal cueing Right Sidelying to Sit: Supervision/Verbal cueing Supine to Sit: Supervision/Verbal cueing Sit to Supine: Supervision/Verbal cueing Transfers Sit to Stand: Supervision/Verbal cueing Stand to Sit: Supervision/Verbal cueing  Trunk/Postural Assessment  Cervical Assessment Cervical Assessment: Exceptions to [facility] (slight forward head) Thoracic Assessment Thoracic Assessment: Exceptions to [facility] (thoracic rounding) Lumbar Assessment Lumbar Assessment: Exceptions to [facility] (posterior pelvic tilt EOB or sitting unsupported) Postural Control Postural Control: Within Functional Limits  Balance Balance Balance Assessed: Yes Static Sitting Balance Static Sitting - Balance Support: No upper extremity supported;Feet supported Static Sitting - Level of Assistance: 7: Independent Static Standing Balance Static Standing - Balance Support: During functional activity Static Standing - Level of Assistance: 5: Stand by assistance Dynamic Standing Balance Dynamic Standing - Balance Support: Bilateral upper extremity supported Dynamic Standing - Level of Assistance:  (contact guard during ADLs) Extremity/Trunk Assessment RUE Assessment RUE Assessment: Within Functional Limits LUE Assessment Passive Range of Motion (PROM) Comments: WNL within hemi shoulder precautions Active Range of Motion (AROM) Comments: sh flexion 50, sh abd 30, IR full AROM, ER to neutral, elb flex WFL, elb ext -10, pronation WFL, supination -10, wrist ext 10 degrees, finger flexion WFL, ext -20 General Strength Comments: Shoulder abduction/FF 2-/5, ER/IR 2+/5, elbow flex 4-/5, ext 4-/5, forearm sup 3-/5, wrist ext/flex 2/5, digital flex 2-/5 ext 1/5, digit flex 4/5, digit ext 2-/5 Brunstrum level for arm: Stage III Synergy is performed voluntarily Brunstrum level for hand: Stage III Synergies performed voluntarily   Keaghan Staton,Dawsyn  OTR/L 01/10/2021, 12:59 PM

## 2021-01-10 NOTE — Progress Notes (Signed)
Occupational Therapy Session Note  Patient Details  Name: Joel Peck MRN: [id number] Date of Birth: 09-08-1935  Today's Date: 01/10/2021 OT Individual Time: 0930-1000 OT Individual Time Calculation (min): 30 min    Short Term Goals: Week 3:  OT Short Term Goal 1 (Week 3): STGs = LTGs  Skilled Therapeutic Interventions/Progress Updates:    Pt seen this session to work on [place].  Reassessed his AROM and strength and reported to pt all of the changes that took place since admission and the strong progress he made.   In sitting, showed pt how he can use the gait belt for L shoulder exercises.  Made a large loop in gait belt so pt could place B wrists inside belt and push belt out to make it taught for shoulder resistance. Once belt was taught had pt work on lifting and lowering arms to 45 degrees.   In standing, pt stood with R hand on walker hand for light support.  L hand placed in small loop on gait belt while therapist held end of belt.  Pt flexed shoulder to his AROM limit of 40 degrees then therapist slightly pulled on belt to bring scapula into more upward rotation and assist pt to lifting arm to 90 degrees then pt used resistance to push arm back to neutral. Pt did well with this exercise and felt it was effective.  Pt able to complete 15 reps in standing with cues to fully engage L LE as he tends to let that flex.   Pt resting in wc with all needs met.   Therapy Documentation Precautions:  Precautions Precautions: Fall Precaution Comments: L inattention/visual field cut, L hemi Restrictions Weight Bearing Restrictions: No  Pain: Pain Assessment Pain Scale: 0-10 Pain Score: 0-No pain    Therapy/Group: Individual Therapy  Elizabeth Haff 01/10/2021, 11:28 AM

## 2021-01-10 NOTE — Progress Notes (Signed)
Patient's blood pressure was checked, read 182/90 manually. On call PA was called. Per [place] PA, monitor blood pressure tonight. Will pass along to oncoming nurse.

## 2021-01-10 NOTE — Progress Notes (Signed)
Physical Therapy Session Note  Patient Details  Name: Joel Peck MRN: [id number] Date of Birth: July 05, 1935  Today's Date: 01/10/2021 PT Individual Time: 1300-1415 PT Individual Time Calculation (min): 75 min   Short Term Goals: Week 3:  PT Short Term Goal 1 (Week 3): STG = LTG due to ELOS  Skilled Therapeutic Interventions/Progress Updates:    Pt presented in w/c and agreed to therapy. Donned shoes and AFO with totalA for time. Sit to stand CGA and pt able to manage LUE into RW splint w/o assist. Ambulated w/ S and RW from room to 47M rehab gym ~182ft. Focus of session on weight shifting and trunk movement during ambulation. Pt demonstrated difficulty with BUE swing, requiring demo and VC during four ~134ft gait trails CGA w/o RW. Pt completed reaching outside BoS forward, left, and right at shoulder height w/ lunge 2x8 BLE minA for balance. Pt completed ball tosses within and outside BoS to tech 5 ft away x 2 min. MinA for balance w/o RW during tosses and pt able to complete quick and slow tosses w/  LoB. Pt transitioned to supine<>prone<>quadraped<>high kneeling on mat with UE support ModA x2. Pt weight shifted w/ contralateral knee raised off mat 2x3 bilaterally. Pt weight shifted on LLE and LUE to kneeling 1x3 modA x2. Pt educated and voiced understanding on BEFAST, safety recommendations for home, and demonstrated HEP. Pt ambulated back to his room w/ RW and S, CGA for turns. Pt requested bathroom and assisted CGA to toilet, continent of bladder. Able to complete peri-care w/o assist but required minA for pulling pants over hips in standing. Pt left seated in w/c with chair alarm on, all needs in reach, made comfortable.  Therapy Documentation Precautions:  Precautions Precautions: Fall Precaution Comments: L hemi, L AFO Restrictions Weight Bearing Restrictions: No  Exercises:   Other Treatments:      Therapy/Group: Individual Therapy  Lucile Shutters,  SPT  01/10/2021, 2:47 PM

## 2021-01-11 ENCOUNTER — Other Ambulatory Visit (HOSPITAL_COMMUNITY): Payer: Self-pay

## 2021-01-11 MED ORDER — ASPIRIN EC 81 MG PO TBEC: 81.0000 mg | DELAYED_RELEASE_TABLET | Freq: Every day | ORAL | Status: DC

## 2021-01-11 MED ORDER — MELATONIN 5 MG PO TABS
5.0000 mg | ORAL_TABLET | Freq: Every day | ORAL | 0 refills | Status: AC
Start: 2021-01-11 — End: ?
  Filled 2021-01-11: qty 30, 30d supply, fill #0

## 2021-01-11 MED ORDER — ASPIRIN 325 MG PO TBEC
325.0000 mg | DELAYED_RELEASE_TABLET | Freq: Every day | ORAL | 0 refills | Status: DC
  Filled 2021-01-11: qty 30, 30d supply, fill #0

## 2021-01-11 MED ORDER — METOPROLOL TARTRATE 50 MG PO TABS
50.0000 mg | ORAL_TABLET | Freq: Two times a day (BID) | ORAL | 0 refills | Status: AC
Start: 2021-01-11 — End: ?
  Filled 2021-01-11: qty 60, 30d supply, fill #0

## 2021-01-11 MED ORDER — CLOPIDOGREL BISULFATE 75 MG PO TABS
75.0000 mg | ORAL_TABLET | Freq: Every day | ORAL | 1 refills | Status: DC
  Filled 2021-01-11: qty 30, 30d supply, fill #0

## 2021-01-11 MED ORDER — METOPROLOL TARTRATE 50 MG PO TABS: 50.0000 mg | ORAL_TABLET | Freq: Two times a day (BID) | ORAL | Status: DC

## 2021-01-11 MED ORDER — APIXABAN 5 MG PO TABS
10.0000 mg | ORAL_TABLET | ORAL | Status: AC
  Administered 2021-01-11: 10 mg via ORAL
  Filled 2021-01-11: qty 2

## 2021-01-11 MED ORDER — PANTOPRAZOLE SODIUM 40 MG PO TBEC
40.0000 mg | DELAYED_RELEASE_TABLET | Freq: Every day | ORAL | 0 refills | Status: AC
Start: 2021-01-12 — End: ?
  Filled 2021-01-11: qty 30, 30d supply, fill #0

## 2021-01-11 MED ORDER — ASPIRIN 81 MG PO TBEC
81.0000 mg | DELAYED_RELEASE_TABLET | Freq: Every day | ORAL | 11 refills | Status: DC
  Filled 2021-01-11: qty 30, 30d supply, fill #0

## 2021-01-11 MED ORDER — APIXABAN (ELIQUIS) VTE STARTER PACK (10MG AND 5MG)
ORAL_TABLET | ORAL | 0 refills | Status: DC
  Filled 2021-01-11: qty 74, 30d supply, fill #0

## 2021-01-11 MED ORDER — GABAPENTIN 100 MG PO CAPS
100.0000 mg | ORAL_CAPSULE | Freq: Every evening | ORAL | 0 refills | Status: AC | PRN
Start: 2021-01-11 — End: ?
  Filled 2021-01-11: qty 30, 30d supply, fill #0

## 2021-01-11 MED ORDER — DOCUSATE SODIUM 100 MG PO CAPS
200.0000 mg | ORAL_CAPSULE | Freq: Every day | ORAL | 0 refills | Status: AC
Start: 2021-01-11 — End: ?
  Filled 2021-01-11: qty 60, 30d supply, fill #0

## 2021-01-11 MED ORDER — ROSUVASTATIN CALCIUM 40 MG PO TABS
40.0000 mg | ORAL_TABLET | Freq: Every day | ORAL | 0 refills | Status: AC
  Filled 2021-01-11: qty 30, 30d supply, fill #0

## 2021-01-11 MED ORDER — APIXABAN (ELIQUIS) EDUCATION KIT FOR DVT/PE PATIENTS
PACK | Freq: Once | Status: AC
  Filled 2021-01-11: qty 1

## 2021-01-11 MED ORDER — TRAZODONE HCL 50 MG PO TABS
50.0000 mg | ORAL_TABLET | Freq: Every day | ORAL | 0 refills | Status: AC
Start: 2021-01-11 — End: ?
  Filled 2021-01-11: qty 30, 30d supply, fill #0

## 2021-01-11 MED ORDER — AMLODIPINE BESYLATE 10 MG PO TABS
10.0000 mg | ORAL_TABLET | Freq: Every day | ORAL | 0 refills | Status: AC
Start: 2021-01-11 — End: ?
  Filled 2021-01-11: qty 30, 30d supply, fill #0

## 2021-01-11 MED ORDER — TAMSULOSIN HCL 0.4 MG PO CAPS
0.4000 mg | ORAL_CAPSULE | Freq: Every day | ORAL | 0 refills | Status: AC
Start: 2021-01-11 — End: ?
  Filled 2021-01-11: qty 30, 30d supply, fill #0

## 2021-01-11 NOTE — Discharge Summary (Signed)
Physician Discharge Summary  Patient ID: Joel Peck MRN: [id number] DOB/AGE: [birth year] 86 y.o.  Admit date: 12/21/2020 Discharge date: 01/11/2021  Discharge Diagnoses:  Principal Problem:   Acute ischemic right MCA stroke [facility]) Active Problems:   Carotid artery stenosis   Essential hypertension   Pain of left hip joint   Anemia   Leucocytosis   Odynophagia   DVT, lower extremity, distal, acute, left posterior tibial [facility])   Discharged Condition: stable  Significant Diagnostic Studies: DG Chest 2 View  Result Date: 12/29/2020 CLINICAL DATA:  Cough EXAM: CHEST - 2 VIEW COMPARISON:  12/22/2020 FINDINGS: Linear atelectasis left base. No consolidation or effusion. Stable cardiomediastinal silhouette with aortic atherosclerosis. Degenerative changes of the spine. IMPRESSION: No active cardiopulmonary disease. Electronically Signed   By: Donavan Foil M.D.   On: 12/29/2020 21:09   DG Chest 2 View  Result Date: 12/22/2020 CLINICAL DATA:  Acute right MCA stroke.  Cough. EXAM: CHEST - 2 VIEW COMPARISON:  Chest x-ray 12/11/2020 FINDINGS: The heart size and mediastinal contours are within normal limits. Both lungs are clear. The visualized skeletal structures are unremarkable. IMPRESSION: No active cardiopulmonary disease. Electronically Signed   By: Ronney Asters M.D.   On: 12/22/2020 16:23   CT HEAD WO CONTRAST (5MM)  Result Date: 12/13/2020 CLINICAL DATA:  Follow-up stroke in the right hemisphere. EXAM: CT HEAD WITHOUT CONTRAST TECHNIQUE: Contiguous axial images were obtained from the base of the skull through the vertex without intravenous contrast. COMPARISON:  CT and MRI studies done yesterday. FINDINGS: Brain: No abnormality is seen affecting the brainstem or cerebellum. Subtle low density is now visible at the site of acute infarction in the mesial right temporal lobe, hippocampus and inferior basal ganglia. No sign of new ischemic insult. The brain otherwise shows age related  volume loss with mild chronic small-vessel change of the white matter. No mass, hemorrhage, hydrocephalus or extra-axial collection. Vascular: There is atherosclerotic calcification of the major vessels at the base of the brain. Skull: Negative Sinuses/Orbits: Clear except for chronic inflammatory change of the diminutive left division of the sphenoid sinus. Orbits negative. Other: None IMPRESSION: Development of subtle low density in the mesial temporal lobe, hippocampus and inferior basal ganglia on the right at the site of acute infarction shown by MRI. No evidence of mass effect or hemorrhage. No new ischemic insult. Electronically Signed   By: Nelson Chimes M.D.   On: 12/13/2020 17:31    VAS [place] LOWER EXTREMITY VENOUS (DVT)  Result Date: 01/10/2021  Lower Venous DVT Study Patient Name:  Joel Peck  Date of Exam:   01/10/2021 Medical Rec #: [id number]       Accession #:    [id number] Date of Birth: 1935/12/21       Patient Gender: M Patient Age:   86 years Exam Location:  [facility] Procedure:      VAS [place] LOWER EXTREMITY VENOUS (DVT) Referring Phys: Joel Peck --------------------------------------------------------------------------------  Indications: Follow up exam.  Anticoagulation: No AC medications seen on chart. Comparison Study: Previous exam on 01/06/2021 positive for partial DVT in LLE PTV Performing Technologist: Rogelia Rohrer RVT, RDMS  Examination Guidelines: A complete evaluation includes B-mode imaging, spectral Doppler, color Doppler, and power Doppler as needed of all accessible portions of each vessel. Bilateral testing is considered an integral part of a complete examination. Limited examinations for reoccurring indications may be performed as noted. The reflux portion of the exam is performed with the patient in reverse Trendelenburg.  +-----+---------------+---------+-----------+----------+--------------+  RIGHT Compressibility Phasicity Spontaneity Properties Thrombus Aging   +-----+---------------+---------+-----------+----------+--------------+  CFV   Full            Yes       Yes                                    +-----+---------------+---------+-----------+----------+--------------+   +---------+---------------+---------+-----------+----------+------------------+  LEFT      Compressibility Phasicity Spontaneity Properties Thrombus Aging      +---------+---------------+---------+-----------+----------+------------------+  CFV       Full            Yes       Yes                                        +---------+---------------+---------+-----------+----------+------------------+  SFJ       Full                                                                 +---------+---------------+---------+-----------+----------+------------------+  FV Prox   Full            Yes       Yes                                        +---------+---------------+---------+-----------+----------+------------------+  FV Mid    Full            Yes       Yes                                        +---------+---------------+---------+-----------+----------+------------------+  FV Distal Full            Yes       Yes                                        +---------+---------------+---------+-----------+----------+------------------+  PFV       Full                                                                 +---------+---------------+---------+-----------+----------+------------------+  POP       Full            Yes       Yes                                        +---------+---------------+---------+-----------+----------+------------------+  PTV       Partial         No        No  Acute - one of                                                                  paired              +---------+---------------+---------+-----------+----------+------------------+  PERO      Full                                                                  +---------+---------------+---------+-----------+----------+------------------+     Summary: RIGHT: - No evidence of common femoral vein obstruction.  LEFT: - Findings consistent with acute deep vein thrombosis involving the left posterior tibial veins. - Findings appear essentially unchanged compared to previous examination. - There is no evidence of superficial venous thrombosis.  - No cystic structure found in the popliteal fossa.  *See table(s) above for measurements and observations. Electronically signed by Servando Snare MD on 01/10/2021 at 6:06:23 PM.    Final    VAS [place] LOWER EXTREMITY VENOUS (DVT)  Result Date: 01/06/2021  Lower Venous DVT Study Patient Name:  IRENE MITCHAM  Date of Exam:   01/06/2021 Medical Rec #: [id number]       Accession #:    [id number] Date of Birth: [birth year]       Patient Gender: M Patient Age:   61 years Exam Location:  [facility] Procedure:      VAS [place] LOWER EXTREMITY VENOUS (DVT) Referring Phys: Leeroy Cha --------------------------------------------------------------------------------  Indications: Swelling.  Risk Factors: None identified. Comparison Study: No prior studies. Performing Technologist: Oliver Hum RVT  Examination Guidelines: A complete evaluation includes B-mode imaging, spectral Doppler, color Doppler, and power Doppler as needed of all accessible portions of each vessel. Bilateral testing is considered an integral part of a complete examination. Limited examinations for reoccurring indications may be performed as noted. The reflux portion of the exam is performed with the patient in reverse Trendelenburg.  +-----+---------------+---------+-----------+----------+--------------+  RIGHT Compressibility Phasicity Spontaneity Properties Thrombus Aging  +-----+---------------+---------+-----------+----------+--------------+  CFV   Full            Yes       Yes                                     +-----+---------------+---------+-----------+----------+--------------+   +---------+---------------+---------+-----------+----------+--------------+  LEFT      Compressibility Phasicity Spontaneity Properties Thrombus Aging  +---------+---------------+---------+-----------+----------+--------------+  CFV       Full            Yes       Yes                                    +---------+---------------+---------+-----------+----------+--------------+  SFJ       Full                                                             +---------+---------------+---------+-----------+----------+--------------+  FV Prox   Full                                                             +---------+---------------+---------+-----------+----------+--------------+  FV Mid    Full                                                             +---------+---------------+---------+-----------+----------+--------------+  FV Distal Full                                                             +---------+---------------+---------+-----------+----------+--------------+  PFV       Full                                                             +---------+---------------+---------+-----------+----------+--------------+  POP       Full            Yes       Yes                                    +---------+---------------+---------+-----------+----------+--------------+  PTV       Partial                                          Acute           +---------+---------------+---------+-----------+----------+--------------+  PERO      Full                                                             +---------+---------------+---------+-----------+----------+--------------+    Summary: RIGHT: - No evidence of common femoral vein obstruction.  LEFT: - Findings consistent with acute deep vein thrombosis involving the left posterior tibial veins. - No cystic structure found in the popliteal fossa.  *See table(s) above for measurements and  observations. Electronically signed by Jamelle Haring on 01/06/2021 at 4:06:25 PM.    Final     Labs:  Basic Metabolic Panel: BMP Latest Ref Rng & Units 01/10/2021 01/03/2021 12/27/2020  Glucose 70 - 99 mg/dL 96 103(H) 108(H)  BUN 8 - 23 mg/dL 6(L) 11 10  Creatinine 0.61 - 1.24 mg/dL 0.89 0.94 1.00  Sodium 135 - 145 mmol/L 136 137 138  Potassium 3.5 - 5.1 mmol/L 3.8 4.0 4.2  Chloride 98 - 111 mmol/L 102 101 102  CO2 22 -  32 mmol/L 28 27 26   Calcium 8.9 - 10.3 mg/dL 9.0 9.1 9.1     CBC: CBC Latest Ref Rng & Units 12/27/2020 12/22/2020 12/21/2020  WBC 4.0 - 10.5 K/uL 13.2(H) 12.0(H) 14.2(H)  Hemoglobin 13.0 - 17.0 g/dL 12.7(L) 11.3(L) 10.9(L)  Hematocrit 39.0 - 52.0 % 38.0(L) 34.4(L) 32.5(L)  Platelets 150 - 400 K/uL 342 234 213     CBG: No results for input(s): GLUCAP in the last 168 hours.  Brief HPI:   Joel Peck is a 86 y.o. male with history of CAD, BPH, hypertension who was admitted via [facility] with left-sided weakness, left facial droop and left hemianopsia on 12/12/2020.  CTA head negative for LVO and he received T kinase.  MRI brain done revealing patchy acute infarct involving posterior limb internal capsule, right periatrial white matter and mesial temporal lobe.  CTA head showed 50% stenosis in right distal CCA and proximal right-ICA.  Dr. Erlinda Hong felt the stroke was due to right ICA stenosis and carotid Dopplers done revealing 60 to 79% R-ICA stenosis.  Dr. Doren Custard was consulted for input and patient underwent right CEA on 12/19.  Postop he had transient dense right sided weakness felt to be due to hypotension.  He was treated with bedrest and fluid bolus and was back to baseline the next day. Dr. Leonie Man recommended DAPT x3 months followed by aspirin alone.  Patient with resultant left inattention with decreasing postural reflex, left-sided weakness as well as delay in sequencing and problem-solving.  CIR was recommended due to functional decline.   Hospital Course: Joel Peck was admitted to rehab 12/21/2020 for inpatient therapies to consist of PT, ST and OT at least three hours five days a week. Past admission physiatrist, therapy team and rehab RN have worked together to provide customized collaborative inpatient  rehab. Blood pressures were monitored on TID basis and it shows some upward trend therefore Lopressor was titrated upwards for tighter control.  He was advised on restricting carbs due to evidence of prediabetes.  His renal status has been monitored with serial BMET and AKI has resolved.  He reported odynophagia and did not want diet adjusted as was ordering appropriate foods.  Symptoms felt to be due to recent surgery and he was encouraged on pulmonary hygiene also.  Productive cough was managed with scheduled Mucinex.  Left AFO was fitted due to foot drop and to help with gait quality.    He was maintained on DAPT during his stay with follow-up CBC showing acute blood loss anemia to be stable.  He did report left foot third digit burning and low-dose gabapentin has been effective in management of pain.  Lower extremity Dopplers were ordered due to left lower extremity edema and showed evidence of acute left posterior tibial vein.  Dopplers were repeated prior to discharge and showed persistent DVT therefore he was started on treatment dose Eliquis. Neurology and vascular were consulted for input on need of DAPT as patient now on DOAC and recommended discontinuation of Plavix.  He has made steady gains during his rehab stay and supervision is recommended at discharge.  He will continue to receive follow-up home health PT, OT and RN by [place] after discharge   Rehab course: During patient's stay in rehab weekly team conferences were held to monitor patient's progress, set goals and discuss barriers to discharge. At admission, patient required mod to max assist with mobility and mod assist with basic ADLs. He exhibited mild cognitive deficits  with  SLUMS score 26/30. He  has had improvement in activity tolerance, balance, postural control as well as ability to compensate for deficits. He requires min assist overall for ADL tasks.  He requires supervision for transfers and to ambulate with 200' with use of RW. Family education has been completed  Disposition: Home  Diet: Heart Healthy  Special Instructions: No driving or strenuous activity till cleared by MD. Eliquis to continue for 3 months.   Discharge Instructions     Ambulatory referral to Neurology   Complete by: As directed    An appointment is requested in approximately: 4 weeks   Ambulatory referral to Physical Medicine Rehab   Complete by: As directed    Hospital follow up      Allergies as of 01/11/2021   No Known Allergies      Medication List     STOP taking these medications    alum & mag hydroxide-simeth 200-200-20 MG/5ML suspension Commonly known as: MAALOX/MYLANTA   celecoxib 200 MG capsule Commonly known as: CELEBREX   clopidogrel 75 MG tablet Commonly known as: PLAVIX   guaiFENesin-dextromethorphan 100-10 MG/5ML syrup Commonly known as: ROBITUSSIN DM   magnesium hydroxide 400 MG/5ML suspension Commonly known as: MILK OF MAGNESIA   oxyCODONE-acetaminophen 5-325 MG tablet Commonly known as: PERCOCET/ROXICET       TAKE these medications    acetaminophen 325 MG tablet Commonly known as: TYLENOL Take 650 mg by mouth every 6 (six) hours as needed for headache (pain).   amLODipine 10 MG tablet Commonly known as: NORVASC Take 1 tablet (10 mg total) by mouth daily.   Aspirin Low Dose 81 MG EC tablet Generic drug: aspirin Take 1 tablet (81 mg total) by mouth daily. Swallow whole. What changed:  medication strength how much to take additional instructions   docusate sodium 100 MG capsule Commonly known as: COLACE Take 2 capsules (200 mg total) by mouth daily after supper.   Eliquis DVT/PE Starter Pack Generic drug: Apixaban Starter  Pack (10mg  and 5mg ) Take as directed on package: start with two-5mg  tablets twice daily for 7 days. On day 8, switch to one-5mg  tablet twice daily.   gabapentin 100 MG capsule Commonly known as: NEURONTIN Take 1 capsule (100 mg total) by mouth at bedtime as needed (toe burning).   melatonin 5 MG Tabs Take 1 tablet (5 mg total) by mouth at bedtime. What changed:  medication strength how much to take   metoprolol tartrate 50 MG tablet Commonly known as: LOPRESSOR Take 1 tablet (50 mg total) by mouth 2 (two) times daily. What changed:  medication strength how much to take   pantoprazole 40 MG tablet Commonly known as: PROTONIX Take 1 tablet (40 mg total) by mouth daily.   rosuvastatin 40 MG tablet Commonly known as: CRESTOR Take 1 tablet (40 mg total) by mouth at bedtime.   tamsulosin 0.4 MG Caps capsule Commonly known as: FLOMAX Take 1 capsule (0.4 mg total) by mouth daily.   traZODone 50 MG tablet Commonly known as: DESYREL Take 1 tablet (50 mg total) by mouth at bedtime.        Follow-up Information     Raulkar, Clide Deutscher, MD Follow up.   Specialty: Physical Medicine and Rehabilitation Contact information: [address] [place] [address] [phone]         [facility] Follow up.   Contact information: [address]     [place] [address] [phone]  Ronita Hipps, MD. Call.   Specialty: Family Medicine Why: for post hospital follow up Contact information: [place] [address] [phone]         [facility] Follow up on 01/13/2021.   Specialty: Vascular Surgery Why: Be there at 10;15 FOR 10:30 appointment Contact information: [address] [place] [phone]                Signed: Bary Leriche 01/19/2021, 9:42 PM

## 2021-01-11 NOTE — Progress Notes (Signed)
INPATIENT REHABILITATION DISCHARGE NOTE   Discharge instructions by: Jeannene Patella, PA  Verbalized understanding: yes  Skin care/Wound care healing?  Inc[facility] He[facility].  Pain: none  IV's: none  Tubes/Drains: none  O2: none  Safety instructions: reviewed with pt and family  Patient belongings: sent with pt  Discharged to: home  Discharged via: family transport  Notes: done.   Gerald Stabs, RN

## 2021-01-11 NOTE — Progress Notes (Addendum)
Inpatient Rehabilitation Discharge Medication Review by a Pharmacist  A complete drug regimen review was completed for this patient to identify any potential clinically significant medication issues.  High Risk Drug Classes Is patient taking? Indication by Medication  Antipsychotic No   Anticoagulant Yes Eliquis for new DVT  Antibiotic No   Opioid No   Antiplatelet Yes ASA 81mg /d s/p CVA  Hypoglycemics/insulin No   Vasoactive Medication Yes Metoprolol, Norvasc for HTN  Chemotherapy No   Other No      Type of Medication Issue Identified Description of Issue Recommendation(s)  Drug Interaction(s) (clinically significant)     Duplicate Therapy     Allergy     No Medication Administration End Date     Incorrect Dose  Discharge ASA dose should be decreased to 81mg  since leaving on DOAC. Messaged MD and PA. They are contacting previous consult services to clarify>>ASA 81mg /d and d/c Plavix  Additional Drug Therapy Needed     Significant med changes from prior encounter (inform family/care partners about these prior to discharge). Eliquis added Education provided to patient and son  Other       Clinically significant medication issues were identified that warrant physician communication and completion of prescribed/recommended actions by midnight of the next day:  Yes  Name of provider notified for urgent issues identified: Raulkar MD and Algis Liming Pa  Provider Method of Notification: chat  Pharmacist comments: No f/urther f/u required  Time spent performing this drug regimen review (minutes):  >16min  Joel Peck, PharmD, BCPS Clinical Staff Pharmacist [email or web address] Joel Peck 01/11/2021 10:14 AM

## 2021-01-11 NOTE — TOC Benefit Eligibility Note (Signed)
Patient Teacher, English as a foreign language completed.    The patient is currently admitted and upon discharge could be taking Eliquis 5 mg.  The current 30 day co-pay is, $45.00.   The patient is insured through [place] Patient Advocate Specialist [place] Patient Advocate Team Direct Number: [phone]  Fax: [phone]

## 2021-01-11 NOTE — Progress Notes (Signed)
PROGRESS NOTE   Subjective/Complaints: BP elevated to 004 systolic last night. Will increase lopressor to 50mg  BID.  Discussed plan for discharge around 11am, going over medications and follow-ups around 10am.   ROS:  Pt denies SOB, abd pain, CP, N/V/C/D, and vision changes, +left foot third digit intermittent burning, cough improved, +insomnia  Objective:   VAS [place] LOWER EXTREMITY VENOUS (DVT)  Result Date: 01/10/2021  Lower Venous DVT Study Patient Name:  Joel Peck  Date of Exam:   01/10/2021 Medical Rec #: [id number]       Accession #:    [id number] Date of Birth: Feb 18, 1935       Patient Gender: M Patient Age:   86 years Exam Location:  [facility] Procedure:      VAS [place] LOWER EXTREMITY VENOUS (DVT) Referring Phys: PAMELA LOVE --------------------------------------------------------------------------------  Indications: Follow up exam.  Anticoagulation: No AC medications seen on chart. Comparison Study: Previous exam on 01/06/2021 positive for partial DVT in LLE PTV Performing Technologist: Rogelia Rohrer RVT, RDMS  Examination Guidelines: A complete evaluation includes B-mode imaging, spectral Doppler, color Doppler, and power Doppler as needed of all accessible portions of each vessel. Bilateral testing is considered an integral part of a complete examination. Limited examinations for reoccurring indications may be performed as noted. The reflux portion of the exam is performed with the patient in reverse Trendelenburg.  +-----+---------------+---------+-----------+----------+--------------+  RIGHT Compressibility Phasicity Spontaneity Properties Thrombus Aging  +-----+---------------+---------+-----------+----------+--------------+  CFV   Full            Yes       Yes                                    +-----+---------------+---------+-----------+----------+--------------+    +---------+---------------+---------+-----------+----------+------------------+  LEFT      Compressibility Phasicity Spontaneity Properties Thrombus Aging      +---------+---------------+---------+-----------+----------+------------------+  CFV       Full            Yes       Yes                                        +---------+---------------+---------+-----------+----------+------------------+  SFJ       Full                                                                 +---------+---------------+---------+-----------+----------+------------------+  FV Prox   Full            Yes       Yes                                        +---------+---------------+---------+-----------+----------+------------------+  FV Mid  Full            Yes       Yes                                        +---------+---------------+---------+-----------+----------+------------------+  FV Distal Full            Yes       Yes                                        +---------+---------------+---------+-----------+----------+------------------+  PFV       Full                                                                 +---------+---------------+---------+-----------+----------+------------------+  POP       Full            Yes       Yes                                        +---------+---------------+---------+-----------+----------+------------------+  PTV       Partial         No        No                     Acute - one of                                                                  paired              +---------+---------------+---------+-----------+----------+------------------+  PERO      Full                                                                 +---------+---------------+---------+-----------+----------+------------------+     Summary: RIGHT: - No evidence of common femoral vein obstruction.  LEFT: - Findings consistent with acute deep vein thrombosis involving the left posterior tibial veins. - Findings  appear essentially unchanged compared to previous examination. - There is no evidence of superficial venous thrombosis.  - No cystic structure found in the popliteal fossa.  *See table(s) above for measurements and observations. Electronically signed by Servando Snare MD on 01/10/2021 at 6:06:23 PM.    Final    No results for input(s): WBC, HGB, HCT, PLT in the last 72 hours.  Recent Labs    01/10/21 0520  NA 136  K 3.8  CL 102  CO2 28  GLUCOSE 96  BUN 6*  CREATININE 0.89  CALCIUM 9.0  Intake/Output Summary (Last 24 hours) at 01/11/2021 0918 Last data filed at 01/10/2021 1850 Gross per 24 hour  Intake 480 ml  Output 100 ml  Net 380 ml        Physical Exam: Vital Signs Blood pressure (!) 150/69, pulse 73, temperature 99.1 F (37.3 C), resp. rate 16, height 5\' 7"  (1.702 m), weight 77.1 kg, SpO2 100 %. Gen: no distress, normal appearing HEENT: oral mucosa pink and moist, NCAT Cardio: Reg rate Chest: normal effort, normal rate of breathing Abd: soft, non-distended Ext: LLE edema- improved Psych: pleasant and cooperative  Skin: Clean and intact without signs of breakdown Neuro: oriented and alert. Reasonable insight and awareness. No CN signs. Left EF and EE 3/5 to 2/5 wrist,HI. LLE 3- HE, KE and 0/5 at ankle. Senses pain and light touch.  Musculoskeletal: normal rom. L foot 1+ edema- mild  Assessment/Plan: 1. Functional deficits which require 3+ hours per day of interdisciplinary therapy in a comprehensive inpatient rehab setting. Physiatrist is providing close team supervision and 24 hour management of active medical problems listed below. Physiatrist and rehab team continue to assess barriers to discharge/monitor patient progress toward functional and medical goals  Care Tool:  Bathing    Body parts bathed by patient: Left arm, Chest, Abdomen, Front perineal area, Buttocks, Right upper leg, Left upper leg, Right lower leg, Left lower leg, Face, Right arm   Body parts  bathed by helper: Right arm     Bathing assist Assist Level: Contact Guard/Touching assist     Upper Body Dressing/Undressing Upper body dressing Upper body dressing/undressing activity did not occur (including orthotics): Safety/medical concerns What is the patient wearing?: Pull over shirt    Upper body assist Assist Level: Supervision/Verbal cueing    Lower Body Dressing/Undressing Lower body dressing    Lower body dressing activity did not occur: Safety/medical concerns What is the patient wearing?: Pants, Incontinence brief     Lower body assist Assist for lower body dressing: Minimal Assistance - Patient > 75%     Toileting Toileting    Toileting assist Assist for toileting: Contact Guard/Touching assist     Transfers Chair/bed transfer  Transfers assist     Chair/bed transfer assist level: Supervision/Verbal cueing Chair/bed transfer assistive device: Programmer, multimedia   Ambulation assist      Assist level: Supervision/Verbal cueing Assistive device: Walker-rolling Max distance: 200   Walk 10 feet activity   Assist  Walk 10 feet activity did not occur: Safety/medical concerns  Assist level: Supervision/Verbal cueing Assistive device: Walker-rolling   Walk 50 feet activity   Assist Walk 50 feet with 2 turns activity did not occur: Safety/medical concerns  Assist level: Supervision/Verbal cueing Assistive device: Walker-rolling    Walk 150 feet activity   Assist Walk 150 feet activity did not occur: Safety/medical concerns  Assist level: Supervision/Verbal cueing Assistive device: Walker-rolling    Walk 10 feet on uneven surface  activity   Assist Walk 10 feet on uneven surfaces activity did not occur: Safety/medical concerns   Assist level: Contact Guard/Touching assist Assistive device: Walker-rolling   Wheelchair     Assist Is the patient using a wheelchair?: No Type of Wheelchair: Manual     Wheelchair assist level: Dependent - Patient 0% Max wheelchair distance: 133ft    Wheelchair 50 feet with 2 turns activity    Assist        Assist Level: Dependent - Patient 0%   Wheelchair 150 feet activity  Assist      Assist Level: Dependent - Patient 0%   Blood pressure (!) 150/69, pulse 73, temperature 99.1 F (37.3 C), resp. rate 16, height 5\' 7"  (1.702 m), weight 77.1 kg, SpO2 100 %.  Medical Problem List and Plan: 1. 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  DVT in left lower extremity in left posterior tibial vein. Discussed with patient, will repeat u/s today and in 1 week- asked Pam to order at [facility] imaging, ambulating 165 feet: may discontinue Lovenox, clear to shave.             -antiplatelet therapy: continue DAPT X 3 months followed by ASA alone.  3. Pain: d/c oxycodone 4. Insomnia: continue trazodone to 50mg  HS               -antipsychotic agents: N/A    -continue to provide egosupport and positive reinforcement 5. Neuropsych: This patient is capable of making decisions on his own behalf. 6. Skin/Wound Care: Routine pressure relief measures.              --monitor right neck incision for healing/edema.  7. Fluids/Electrolytes/Nutrition: Monitor I/O. Check lytes in am.  8. HTN: Monitor BP TID with SBP goals 110-120 to avoid hypoperfusion.              --Continue Norvasc and metoprolol BID. Increase Lopressor to 50mg . Advised to check home BP cuff against our measurements to ensure his home machine is working properly.        Vitals:    12/21/20 1959 12/22/20 0456  BP: (!) 140/59 128/61  Pulse: 68 66  Resp: 18 18  Temp: 98.4 F (36.9 C) 98.4 F (36.9 C)  SpO2: 98% 97%   9. AKI: SCr has risen from 1.03-->1.30. Likely due to dye/hypotension              --Encourage fluid intake. 12/21 recheck SCr improved to 0.9 10. Leucocytosis: Likely reactive due to surgery. Will monitor for signs of infection 11. ABLA: Likely due to thrombolytics/surgery with drop in  Hgb 14.3--->10.9---> 11.3 on 12/21 --Will monitor with serial CBC for trend as on DAPT/Lovenox  12. Hyperlipidemia: LDL 111. Continue Crestor.  13. BPH: Continue Flomax. Will check PVRs as reporting hesitancy.  14. Post CEA odynophagia/dysphagia: He is ordering appropriate foods. Will monitor for now.  --he does not want diet adjusted at this time 15. Insomnia: Will increase melatonin to 5 mg. Family to spend night for support. Continue trazodone.  16. Chronic constipation-  change milk of mag to PRN 17. GERD- will increase Protonix- used Omeprazole at home.   18.  Odynophagia related to CEA -some crackles at right base on exam. CXR clear  -encourage IS, FV  Placed nursing order to bring him tea with honey daily.  19. Productive cough: continue robitussin. Add mucinex q12H 20. Prediabetes: placed nursing order to provide only water, no drinks with added sugar 21. Left foot third digit burning: add prn gabapentin 100mg  HS 22. Left lower extremity edema: ultrasound ordered 23. Left lower extremity foot drop: AFO ordered    >30 minutes spent in discharge of patient including review of medications and follow-up appointments, physical examination, and  in answering all patient's questions   LOS: 21 days A FACE TO FACE EVALUATION WAS PERFORMED  Martha Clan P Carnel Stegman 01/11/2021, 9:18 AM

## 2021-01-12 ENCOUNTER — Encounter: Payer: Self-pay | Admitting: Physical Medicine and Rehabilitation

## 2021-01-12 DIAGNOSIS — Z7982 Long term (current) use of aspirin: Secondary | ICD-10-CM | POA: Diagnosis not present

## 2021-01-12 DIAGNOSIS — K219 Gastro-esophageal reflux disease without esophagitis: Secondary | ICD-10-CM | POA: Diagnosis not present

## 2021-01-12 DIAGNOSIS — R7303 Prediabetes: Secondary | ICD-10-CM | POA: Diagnosis not present

## 2021-01-12 DIAGNOSIS — M1612 Unilateral primary osteoarthritis, left hip: Secondary | ICD-10-CM | POA: Diagnosis not present

## 2021-01-12 DIAGNOSIS — I10 Essential (primary) hypertension: Secondary | ICD-10-CM | POA: Diagnosis not present

## 2021-01-12 DIAGNOSIS — I2510 Atherosclerotic heart disease of native coronary artery without angina pectoris: Secondary | ICD-10-CM | POA: Diagnosis not present

## 2021-01-12 DIAGNOSIS — E785 Hyperlipidemia, unspecified: Secondary | ICD-10-CM | POA: Diagnosis not present

## 2021-01-12 DIAGNOSIS — I69354 Hemiplegia and hemiparesis following cerebral infarction affecting left non-dominant side: Secondary | ICD-10-CM | POA: Diagnosis not present

## 2021-01-12 DIAGNOSIS — Z87891 Personal history of nicotine dependence: Secondary | ICD-10-CM | POA: Diagnosis not present

## 2021-01-12 DIAGNOSIS — Z7902 Long term (current) use of antithrombotics/antiplatelets: Secondary | ICD-10-CM | POA: Diagnosis not present

## 2021-01-12 DIAGNOSIS — K5909 Other constipation: Secondary | ICD-10-CM | POA: Diagnosis not present

## 2021-01-12 DIAGNOSIS — G4700 Insomnia, unspecified: Secondary | ICD-10-CM | POA: Diagnosis not present

## 2021-01-12 DIAGNOSIS — R1310 Dysphagia, unspecified: Secondary | ICD-10-CM | POA: Diagnosis not present

## 2021-01-12 DIAGNOSIS — I63511 Cerebral infarction due to unspecified occlusion or stenosis of right middle cerebral artery: Secondary | ICD-10-CM | POA: Diagnosis not present

## 2021-01-12 DIAGNOSIS — N400 Enlarged prostate without lower urinary tract symptoms: Secondary | ICD-10-CM | POA: Diagnosis not present

## 2021-01-12 DIAGNOSIS — I82442 Acute embolism and thrombosis of left tibial vein: Secondary | ICD-10-CM | POA: Diagnosis not present

## 2021-01-13 ENCOUNTER — Ambulatory Visit (INDEPENDENT_AMBULATORY_CARE_PROVIDER_SITE_OTHER): Payer: PPO | Admitting: Physician Assistant

## 2021-01-13 ENCOUNTER — Other Ambulatory Visit: Payer: Self-pay

## 2021-01-13 ENCOUNTER — Encounter: Payer: Self-pay | Admitting: Physician Assistant

## 2021-01-13 VITALS — BP 169/70 | HR 69 | Temp 98.1°F | Resp 20 | Ht 67.0 in | Wt 169.0 lb

## 2021-01-13 DIAGNOSIS — I6521 Occlusion and stenosis of right carotid artery: Secondary | ICD-10-CM

## 2021-01-13 NOTE — Progress Notes (Signed)
POST OPERATIVE OFFICE NOTE    CC:  F/u for surgery  HPI:  This is a 86 y.o. male who is s/p right carotid endarterectomy by Dr. Scot Dock on 12/20/20. This was performed secondary to symptomatic right carotid stenosis. Patient had presented with right brain stroke and was found to have critical right ICA stenosis on Duplex. He tolerated the procedure well and was eventually discharged to inpatient rehab. He has some residual left hemiparesis. He is back at home now. He has gained some mobility and strength back in his left upper and lower extremities. He is now able to ambulate with walker. He is starting outpatient PT this week.   He denies any new neurological symptoms. He denies any visual changes, slurred speech, hoarseness, trouble swallowing, facial drooping, unilateral upper or lower extremity weakness or numbness. He has held off on shaving as he was told not to since his d/c. He is not having any incisional issues. Denies any numbness along incision.  No Known Allergies  Current Outpatient Medications  Medication Sig Dispense Refill   acetaminophen (TYLENOL) 325 MG tablet Take 650 mg by mouth every 6 (six) hours as needed for headache (pain).     amLODipine (NORVASC) 10 MG tablet Take 1 tablet (10 mg total) by mouth daily. 30 tablet 0   APIXABAN (ELIQUIS) VTE STARTER PACK (10MG  AND 5MG ) Take as directed on package: start with two-5mg  tablets twice daily for 7 days. On day 8, switch to one-5mg  tablet twice daily. 74 each 0   aspirin 81 MG EC tablet Take 1 tablet (81 mg total) by mouth daily. Swallow whole. 30 tablet 11   docusate sodium (COLACE) 100 MG capsule Take 2 capsules (200 mg total) by mouth daily after supper. 60 capsule 0   gabapentin (NEURONTIN) 100 MG capsule Take 1 capsule (100 mg total) by mouth at bedtime as needed (toe burning). 30 capsule 0   melatonin 5 MG TABS Take 1 tablet (5 mg total) by mouth at bedtime. 30 tablet 0   metoprolol tartrate (LOPRESSOR) 50 MG tablet  Take 1 tablet (50 mg total) by mouth 2 (two) times daily. 60 tablet 0   pantoprazole (PROTONIX) 40 MG tablet Take 1 tablet (40 mg total) by mouth daily. 30 tablet 0   rosuvastatin (CRESTOR) 40 MG tablet Take 1 tablet (40 mg total) by mouth at bedtime. 30 tablet 0   tamsulosin (FLOMAX) 0.4 MG CAPS capsule Take 1 capsule (0.4 mg total) by mouth daily. 30 capsule 0   traZODone (DESYREL) 50 MG tablet Take 1 tablet (50 mg total) by mouth at bedtime. 30 tablet 0   No current facility-administered medications for this visit.     ROS:  See HPI  Physical Exam:  Vitals:   01/13/21 1105 01/13/21 1107  BP: (!) 171/81 (!) 169/70  Pulse: 69   Resp: 20   Temp: 98.1 F (36.7 C)   TempSrc: Temporal   SpO2: 98%   Weight: 169 lb (76.7 kg)   Height: 5\' 7"  (1.702 m)    General: well appearing, well nourished, in no distress Lungs: non labored Cardiac: regular rate and rhythm Incision:  right neck incision healing well. No swelling or hematoma Extremities:  moving all extremities, left sided upper and lower extremity weakness Neuro: alert and oriented   Assessment/Plan:  This is a 86 y.o. male who is s/p Right CEA on 12/20/20 by Dr. Scot Dock. This was for symptomatic right ICA stenosis. Patient has done very well post operatively. He has  residual left hemiparesis. He initially went to rehab but is now at home. He has gained some mobility and strength back in his left upper and lower extremities. He is now able to ambulate with walker. He is starting outpatient PT this week. He has no new neurological deficits. His right neck incision is healing very nicely. - Discussed with patient importance of good blood pressure control. He has follow up with PCP on Monday 01/17/21. - he will continue Aspirin and Statin -He will return in 9 months with Carotid duplex - He knows to call earlier if any new or concerning symptoms  Karoline Caldwell, PA-C [facility] [phone]  Clinic MD:   [facility]

## 2021-01-14 ENCOUNTER — Other Ambulatory Visit: Payer: Self-pay | Admitting: *Deleted

## 2021-01-14 DIAGNOSIS — I6521 Occlusion and stenosis of right carotid artery: Secondary | ICD-10-CM

## 2021-01-17 DIAGNOSIS — Z6824 Body mass index (BMI) 24.0-24.9, adult: Secondary | ICD-10-CM | POA: Diagnosis not present

## 2021-01-17 DIAGNOSIS — Z09 Encounter for follow-up examination after completed treatment for conditions other than malignant neoplasm: Secondary | ICD-10-CM | POA: Diagnosis not present

## 2021-01-17 DIAGNOSIS — I6930 Unspecified sequelae of cerebral infarction: Secondary | ICD-10-CM | POA: Diagnosis not present

## 2021-01-18 DIAGNOSIS — N400 Enlarged prostate without lower urinary tract symptoms: Secondary | ICD-10-CM | POA: Diagnosis not present

## 2021-01-18 DIAGNOSIS — G4700 Insomnia, unspecified: Secondary | ICD-10-CM | POA: Diagnosis not present

## 2021-01-18 DIAGNOSIS — Z7982 Long term (current) use of aspirin: Secondary | ICD-10-CM | POA: Diagnosis not present

## 2021-01-18 DIAGNOSIS — K219 Gastro-esophageal reflux disease without esophagitis: Secondary | ICD-10-CM | POA: Diagnosis not present

## 2021-01-18 DIAGNOSIS — M1612 Unilateral primary osteoarthritis, left hip: Secondary | ICD-10-CM | POA: Diagnosis not present

## 2021-01-18 DIAGNOSIS — I82442 Acute embolism and thrombosis of left tibial vein: Secondary | ICD-10-CM | POA: Diagnosis not present

## 2021-01-18 DIAGNOSIS — Z7902 Long term (current) use of antithrombotics/antiplatelets: Secondary | ICD-10-CM | POA: Diagnosis not present

## 2021-01-18 DIAGNOSIS — Z87891 Personal history of nicotine dependence: Secondary | ICD-10-CM | POA: Diagnosis not present

## 2021-01-18 DIAGNOSIS — K5909 Other constipation: Secondary | ICD-10-CM | POA: Diagnosis not present

## 2021-01-18 DIAGNOSIS — E785 Hyperlipidemia, unspecified: Secondary | ICD-10-CM | POA: Diagnosis not present

## 2021-01-18 DIAGNOSIS — I2510 Atherosclerotic heart disease of native coronary artery without angina pectoris: Secondary | ICD-10-CM | POA: Diagnosis not present

## 2021-01-18 DIAGNOSIS — I10 Essential (primary) hypertension: Secondary | ICD-10-CM | POA: Diagnosis not present

## 2021-01-18 DIAGNOSIS — I63511 Cerebral infarction due to unspecified occlusion or stenosis of right middle cerebral artery: Secondary | ICD-10-CM | POA: Diagnosis not present

## 2021-01-18 DIAGNOSIS — I69354 Hemiplegia and hemiparesis following cerebral infarction affecting left non-dominant side: Secondary | ICD-10-CM | POA: Diagnosis not present

## 2021-01-18 DIAGNOSIS — R7303 Prediabetes: Secondary | ICD-10-CM | POA: Diagnosis not present

## 2021-01-18 DIAGNOSIS — R1310 Dysphagia, unspecified: Secondary | ICD-10-CM | POA: Diagnosis not present

## 2021-01-19 DIAGNOSIS — R1310 Dysphagia, unspecified: Secondary | ICD-10-CM

## 2021-01-19 DIAGNOSIS — D649 Anemia, unspecified: Secondary | ICD-10-CM

## 2021-01-19 DIAGNOSIS — D72829 Elevated white blood cell count, unspecified: Secondary | ICD-10-CM

## 2021-01-19 DIAGNOSIS — I824Z2 Acute embolism and thrombosis of unspecified deep veins of left distal lower extremity: Secondary | ICD-10-CM

## 2021-01-20 ENCOUNTER — Telehealth (HOSPITAL_COMMUNITY): Payer: Self-pay | Admitting: Pharmacist

## 2021-01-20 ENCOUNTER — Other Ambulatory Visit: Payer: Self-pay | Admitting: Physical Medicine and Rehabilitation

## 2021-01-20 ENCOUNTER — Other Ambulatory Visit (HOSPITAL_COMMUNITY): Payer: Self-pay

## 2021-01-20 MED ORDER — APIXABAN 5 MG PO TABS: 5.0000 mg | ORAL_TABLET | Freq: Two times a day (BID) | ORAL | 1 refills | Status: DC

## 2021-01-20 NOTE — Telephone Encounter (Signed)
Pharmacy Transitions of Care Follow-up Telephone Call  Date of discharge: 01/11/21  Discharge Diagnosis: DVT - eliquis starter  How have you been since you were released from the hospital? Doing well   Medication changes made at discharge:  - START:  docusate sodium (COLACE)  Eliquis DVT/PE Starter Pack (Apixaban Starter Pack (10mg  and 5mg ))  gabapentin (NEURONTIN)  pantoprazole (PROTONIX)  traZODone (DESYREL)   - STOPPED: clopidogrel  - CHANGED:  Aspirin Low Dose (aspirin)  melatonin  metoprolol tartrate (LOPRESSOR)   Medication changes verified by the patient? Yes    Medication Accessibility:  Home Pharmacy: [facility] [place]  Was the patient provided with refills on discharged medications? no   Have all prescriptions been transferred from [facility] to home pharmacy? N/a   Is the patient able to afford medications? Has ins Notable copays: Eliquis, $45 Eligible patient assistance: may need assistance    Medication Review:   APIXABAN (ELIQUIS)  Apixaban 10 mg BID initiated on 01/11/21. Will switch to apixaban 5 mg BID after 7 days  - Discussed importance of taking medication around the same time everyday  - Reviewed potential DDIs with patient  - Advised patient of medications to avoid (NSAIDs, ASA)  - Educated that Tylenol (acetaminophen) will be the preferred analgesic to prevent risk of bleeding  - Emphasized importance of monitoring for signs and symptoms of bleeding (abnormal bruising, prolonged bleeding, nose bleeds, bleeding from gums, discolored urine, black tarry stools)  - Advised patient to alert all providers of anticoagulation therapy prior to starting a new medication or having a procedure    Follow-up Appointments:  PCP Hospital f/u appt confirmed? Pt has already seen PCP for hospital follow-up   [facility] f/u appt confirmed?  Scheduled to see Dr. Ranell Patrick, physical medicine on 02/14/21.   If their condition worsens, is the pt aware to call PCP or go  to the Emergency Dept.? yes  Final Patient Assessment: Pt was pleasant and seemed well informed about his medications.  He is taking Eliquis correctly with no issues.  No refills provided, I have reached out to MD and pt will do the same.  He will need a refill prior to his next appointment.

## 2021-02-01 DIAGNOSIS — M1612 Unilateral primary osteoarthritis, left hip: Secondary | ICD-10-CM | POA: Diagnosis not present

## 2021-02-01 DIAGNOSIS — I82442 Acute embolism and thrombosis of left tibial vein: Secondary | ICD-10-CM | POA: Diagnosis not present

## 2021-02-01 DIAGNOSIS — K219 Gastro-esophageal reflux disease without esophagitis: Secondary | ICD-10-CM | POA: Diagnosis not present

## 2021-02-01 DIAGNOSIS — R1310 Dysphagia, unspecified: Secondary | ICD-10-CM | POA: Diagnosis not present

## 2021-02-01 DIAGNOSIS — K5909 Other constipation: Secondary | ICD-10-CM | POA: Diagnosis not present

## 2021-02-01 DIAGNOSIS — Z87891 Personal history of nicotine dependence: Secondary | ICD-10-CM | POA: Diagnosis not present

## 2021-02-01 DIAGNOSIS — N400 Enlarged prostate without lower urinary tract symptoms: Secondary | ICD-10-CM | POA: Diagnosis not present

## 2021-02-01 DIAGNOSIS — Z7902 Long term (current) use of antithrombotics/antiplatelets: Secondary | ICD-10-CM | POA: Diagnosis not present

## 2021-02-01 DIAGNOSIS — G4700 Insomnia, unspecified: Secondary | ICD-10-CM | POA: Diagnosis not present

## 2021-02-01 DIAGNOSIS — E785 Hyperlipidemia, unspecified: Secondary | ICD-10-CM | POA: Diagnosis not present

## 2021-02-01 DIAGNOSIS — I63511 Cerebral infarction due to unspecified occlusion or stenosis of right middle cerebral artery: Secondary | ICD-10-CM | POA: Diagnosis not present

## 2021-02-01 DIAGNOSIS — Z7982 Long term (current) use of aspirin: Secondary | ICD-10-CM | POA: Diagnosis not present

## 2021-02-01 DIAGNOSIS — I2510 Atherosclerotic heart disease of native coronary artery without angina pectoris: Secondary | ICD-10-CM | POA: Diagnosis not present

## 2021-02-01 DIAGNOSIS — I69354 Hemiplegia and hemiparesis following cerebral infarction affecting left non-dominant side: Secondary | ICD-10-CM | POA: Diagnosis not present

## 2021-02-01 DIAGNOSIS — I10 Essential (primary) hypertension: Secondary | ICD-10-CM | POA: Diagnosis not present

## 2021-02-01 DIAGNOSIS — R7303 Prediabetes: Secondary | ICD-10-CM | POA: Diagnosis not present

## 2021-02-04 DIAGNOSIS — I69354 Hemiplegia and hemiparesis following cerebral infarction affecting left non-dominant side: Secondary | ICD-10-CM | POA: Diagnosis not present

## 2021-02-04 DIAGNOSIS — N400 Enlarged prostate without lower urinary tract symptoms: Secondary | ICD-10-CM | POA: Diagnosis not present

## 2021-02-04 DIAGNOSIS — Z87891 Personal history of nicotine dependence: Secondary | ICD-10-CM | POA: Diagnosis not present

## 2021-02-04 DIAGNOSIS — Z7982 Long term (current) use of aspirin: Secondary | ICD-10-CM | POA: Diagnosis not present

## 2021-02-04 DIAGNOSIS — I63511 Cerebral infarction due to unspecified occlusion or stenosis of right middle cerebral artery: Secondary | ICD-10-CM | POA: Diagnosis not present

## 2021-02-04 DIAGNOSIS — E785 Hyperlipidemia, unspecified: Secondary | ICD-10-CM | POA: Diagnosis not present

## 2021-02-04 DIAGNOSIS — M1612 Unilateral primary osteoarthritis, left hip: Secondary | ICD-10-CM | POA: Diagnosis not present

## 2021-02-04 DIAGNOSIS — I82442 Acute embolism and thrombosis of left tibial vein: Secondary | ICD-10-CM | POA: Diagnosis not present

## 2021-02-04 DIAGNOSIS — G4700 Insomnia, unspecified: Secondary | ICD-10-CM | POA: Diagnosis not present

## 2021-02-04 DIAGNOSIS — Z7902 Long term (current) use of antithrombotics/antiplatelets: Secondary | ICD-10-CM | POA: Diagnosis not present

## 2021-02-04 DIAGNOSIS — R7303 Prediabetes: Secondary | ICD-10-CM | POA: Diagnosis not present

## 2021-02-04 DIAGNOSIS — I10 Essential (primary) hypertension: Secondary | ICD-10-CM | POA: Diagnosis not present

## 2021-02-04 DIAGNOSIS — K5909 Other constipation: Secondary | ICD-10-CM | POA: Diagnosis not present

## 2021-02-04 DIAGNOSIS — K219 Gastro-esophageal reflux disease without esophagitis: Secondary | ICD-10-CM | POA: Diagnosis not present

## 2021-02-04 DIAGNOSIS — R1310 Dysphagia, unspecified: Secondary | ICD-10-CM | POA: Diagnosis not present

## 2021-02-04 DIAGNOSIS — I2510 Atherosclerotic heart disease of native coronary artery without angina pectoris: Secondary | ICD-10-CM | POA: Diagnosis not present

## 2021-02-08 DIAGNOSIS — G4700 Insomnia, unspecified: Secondary | ICD-10-CM | POA: Diagnosis not present

## 2021-02-08 DIAGNOSIS — R1310 Dysphagia, unspecified: Secondary | ICD-10-CM | POA: Diagnosis not present

## 2021-02-08 DIAGNOSIS — I69354 Hemiplegia and hemiparesis following cerebral infarction affecting left non-dominant side: Secondary | ICD-10-CM | POA: Diagnosis not present

## 2021-02-08 DIAGNOSIS — Z87891 Personal history of nicotine dependence: Secondary | ICD-10-CM | POA: Diagnosis not present

## 2021-02-08 DIAGNOSIS — K219 Gastro-esophageal reflux disease without esophagitis: Secondary | ICD-10-CM | POA: Diagnosis not present

## 2021-02-08 DIAGNOSIS — I82442 Acute embolism and thrombosis of left tibial vein: Secondary | ICD-10-CM | POA: Diagnosis not present

## 2021-02-08 DIAGNOSIS — R7303 Prediabetes: Secondary | ICD-10-CM | POA: Diagnosis not present

## 2021-02-08 DIAGNOSIS — N400 Enlarged prostate without lower urinary tract symptoms: Secondary | ICD-10-CM | POA: Diagnosis not present

## 2021-02-08 DIAGNOSIS — I2510 Atherosclerotic heart disease of native coronary artery without angina pectoris: Secondary | ICD-10-CM | POA: Diagnosis not present

## 2021-02-08 DIAGNOSIS — Z7902 Long term (current) use of antithrombotics/antiplatelets: Secondary | ICD-10-CM | POA: Diagnosis not present

## 2021-02-08 DIAGNOSIS — K5909 Other constipation: Secondary | ICD-10-CM | POA: Diagnosis not present

## 2021-02-08 DIAGNOSIS — E785 Hyperlipidemia, unspecified: Secondary | ICD-10-CM | POA: Diagnosis not present

## 2021-02-08 DIAGNOSIS — I63511 Cerebral infarction due to unspecified occlusion or stenosis of right middle cerebral artery: Secondary | ICD-10-CM | POA: Diagnosis not present

## 2021-02-08 DIAGNOSIS — M1612 Unilateral primary osteoarthritis, left hip: Secondary | ICD-10-CM | POA: Diagnosis not present

## 2021-02-08 DIAGNOSIS — Z7982 Long term (current) use of aspirin: Secondary | ICD-10-CM | POA: Diagnosis not present

## 2021-02-08 DIAGNOSIS — I10 Essential (primary) hypertension: Secondary | ICD-10-CM | POA: Diagnosis not present

## 2021-02-09 DIAGNOSIS — K219 Gastro-esophageal reflux disease without esophagitis: Secondary | ICD-10-CM | POA: Diagnosis not present

## 2021-02-09 DIAGNOSIS — R7303 Prediabetes: Secondary | ICD-10-CM | POA: Diagnosis not present

## 2021-02-09 DIAGNOSIS — N400 Enlarged prostate without lower urinary tract symptoms: Secondary | ICD-10-CM | POA: Diagnosis not present

## 2021-02-09 DIAGNOSIS — Z7902 Long term (current) use of antithrombotics/antiplatelets: Secondary | ICD-10-CM | POA: Diagnosis not present

## 2021-02-09 DIAGNOSIS — M1612 Unilateral primary osteoarthritis, left hip: Secondary | ICD-10-CM | POA: Diagnosis not present

## 2021-02-09 DIAGNOSIS — G4700 Insomnia, unspecified: Secondary | ICD-10-CM | POA: Diagnosis not present

## 2021-02-09 DIAGNOSIS — R1310 Dysphagia, unspecified: Secondary | ICD-10-CM | POA: Diagnosis not present

## 2021-02-09 DIAGNOSIS — I63511 Cerebral infarction due to unspecified occlusion or stenosis of right middle cerebral artery: Secondary | ICD-10-CM | POA: Diagnosis not present

## 2021-02-09 DIAGNOSIS — Z7982 Long term (current) use of aspirin: Secondary | ICD-10-CM | POA: Diagnosis not present

## 2021-02-09 DIAGNOSIS — I69354 Hemiplegia and hemiparesis following cerebral infarction affecting left non-dominant side: Secondary | ICD-10-CM | POA: Diagnosis not present

## 2021-02-09 DIAGNOSIS — I10 Essential (primary) hypertension: Secondary | ICD-10-CM | POA: Diagnosis not present

## 2021-02-09 DIAGNOSIS — I2510 Atherosclerotic heart disease of native coronary artery without angina pectoris: Secondary | ICD-10-CM | POA: Diagnosis not present

## 2021-02-09 DIAGNOSIS — K5909 Other constipation: Secondary | ICD-10-CM | POA: Diagnosis not present

## 2021-02-09 DIAGNOSIS — I82442 Acute embolism and thrombosis of left tibial vein: Secondary | ICD-10-CM | POA: Diagnosis not present

## 2021-02-09 DIAGNOSIS — Z87891 Personal history of nicotine dependence: Secondary | ICD-10-CM | POA: Diagnosis not present

## 2021-02-09 DIAGNOSIS — E785 Hyperlipidemia, unspecified: Secondary | ICD-10-CM | POA: Diagnosis not present

## 2021-02-11 DIAGNOSIS — I10 Essential (primary) hypertension: Secondary | ICD-10-CM | POA: Diagnosis not present

## 2021-02-11 DIAGNOSIS — F4321 Adjustment disorder with depressed mood: Secondary | ICD-10-CM | POA: Diagnosis not present

## 2021-02-11 DIAGNOSIS — Z6822 Body mass index (BMI) 22.0-22.9, adult: Secondary | ICD-10-CM | POA: Diagnosis not present

## 2021-02-14 ENCOUNTER — Encounter: Payer: PPO | Attending: Physical Medicine and Rehabilitation | Admitting: Physical Medicine and Rehabilitation

## 2021-02-14 ENCOUNTER — Other Ambulatory Visit: Payer: Self-pay

## 2021-02-14 ENCOUNTER — Encounter: Payer: Self-pay | Admitting: Physical Medicine and Rehabilitation

## 2021-02-14 VITALS — BP 152/75 | HR 60 | Temp 98.1°F | Ht 67.0 in | Wt 149.2 lb

## 2021-02-14 DIAGNOSIS — I10 Essential (primary) hypertension: Secondary | ICD-10-CM | POA: Diagnosis not present

## 2021-02-14 DIAGNOSIS — I639 Cerebral infarction, unspecified: Secondary | ICD-10-CM | POA: Insufficient documentation

## 2021-02-14 DIAGNOSIS — I824Z2 Acute embolism and thrombosis of unspecified deep veins of left distal lower extremity: Secondary | ICD-10-CM | POA: Diagnosis not present

## 2021-02-14 NOTE — Progress Notes (Signed)
Subjective:    Patient ID: Joel Peck, male    DOB: 03/07/1935, 86 y.o.   MRN: [id number]  HPI Joel Peck is an 86 year old man admitted to CIR with stroke, presenting for hospital follow-up. Walking with the cane at all times Denies pain Daughter stays with him all day long.  152/75 in office today  Reviewed blood pressure log.  Eating heart healthy all the time.  Dropped 20 lbs in October.  Sister brings meals every day.  Huge improvement in strength in left side- arm and leg.  He wants to be able to go to the bathroom by himself He wants to cook his own breakfast.   Pain Inventory Average Pain 0 Pain Right Now 0 My pain is  no pain  In the last 24 hours, has pain interfered with the following? General activity 0 Relation with others 0 Enjoyment of life 0 What TIME of day is your pain at its worst? No pain Sleep (in general) Poor  Pain is worse with:  no pain Pain improves with:  no pain Relief from Meds:  no pain  walk with assistance use a cane  retired I need assistance with the following:  dressing, bathing, toileting, meal prep, household duties, and shopping  weakness trouble walking  Any changes since last visit?  no  Any changes since last visit?  no  saw PCP on Friday 02/11/21    Family History  Problem Relation Age of Onset   Dementia Mother    Stroke Father    Hypertension Sister    Social History   Socioeconomic History   Marital status: Married    Spouse name: Not on file   Number of children: Not on file   Years of education: Not on file   Highest education level: Not on file  Occupational History   Not on file  Tobacco Use   Smoking status: Former   Smokeless tobacco: Never   Tobacco comments:    Quit smoking 40 years ago  Vaping Use   Vaping Use: Never used  Substance and Sexual Activity   Alcohol use: No   Drug use: No   Sexual activity: Not on file  Other Topics Concern   Not on file  Social History Narrative    Not on file   Social Determinants of Health   Financial Resource Strain: Not on file  Food Insecurity: Not on file  Transportation Needs: Not on file  Physical Activity: Not on file  Stress: Not on file  Social Connections: Not on file   Past Surgical History:  Procedure Laterality Date   CARDIAC CATHETERIZATION     ENDARTERECTOMY Right 12/20/2020   Procedure: RIGHT CAROTID ENDARTERECTOMY;  Surgeon: Angelia Mould, MD;  Location: [facility];  Service: Vascular;  Laterality: Right;   PATCH ANGIOPLASTY Right 12/20/2020   Procedure: PATCH ANGIOPLASTY USING Rueben Bash BIOLOGIC PATCH;  Surgeon: Angelia Mould, MD;  Location: [facility];  Service: Vascular;  Laterality: Right;   TOTAL HIP ARTHROPLASTY Left 07/03/2018   Procedure: TOTAL HIP ARTHROPLASTY ANTERIOR APPROACH;  Surgeon: Rod Can, MD;  Location: [facility];  Service: Orthopedics;  Laterality: Left;   Past Medical History:  Diagnosis Date   Arthritis    Coronary artery disease    Hypertension    BP (!) 152/75    Pulse 60    Temp 98.1 F (36.7 C)    Ht 5\' 7"  (1.702 m)    Wt 149 lb 3.2 oz (  67.7 kg)    SpO2 95%    BMI 23.37 kg/m   Opioid Risk Score:   Fall Risk Score:  `1  Depression screen PHQ 2/9  Depression screen PHQ 2/9 02/14/2021  Decreased Interest 2  Down, Depressed, Hopeless 2  PHQ - 2 Score 4  Altered sleeping 3  Tired, decreased energy 0  Change in appetite 2  Feeling bad or failure about yourself  0  Trouble concentrating 0  Moving slowly or fidgety/restless 0  Suicidal thoughts 0  PHQ-9 Score 9  Difficult doing work/chores Not difficult at all     Review of Systems  Constitutional:  Positive for appetite change and unexpected weight change.       Weight loss  HENT: Negative.    Eyes: Negative.   Respiratory: Negative.    Cardiovascular: Negative.   Gastrointestinal: Negative.   Endocrine: Negative.   Genitourinary: Negative.   Musculoskeletal:  Positive for gait problem.  Skin: Negative.    Allergic/Immunologic: Negative.   Neurological:  Positive for weakness.  Hematological:  Bruises/bleeds easily.       Apixaban  Psychiatric/Behavioral:  Positive for dysphoric mood.   All other systems reviewed and are negative.     Objective:   Physical Exam Gen: no distress, normal appearing HEENT: oral mucosa pink and moist, NCAT Cardio: Reg rate Chest: normal effort, normal rate of breathing Abd: soft, non-distended Ext: no edema Psych: pleasant, normal affect Skin: intact Neuro: Alert and oriented x3 Musculoskeletal: AFO, 3/5 strength in left upper and lower extremity.     Assessment & Plan:   1) CVA -continue therapies -would benefit from handicap placard to increase her mobility in the community -provided dietary and exercise counseling -discussed that hypertension is number one reversible risk factor for stroke.    2) HTN: -BP is 152/75today.  -Advised checking BP daily at home and logging results to bring into follow-up appointment with her PCP and myself. -Reviewed BP meds today.  -Advised regarding healthy foods that can help lower blood pressure and provided with a list: 1) citrus foods- high in vitamins and minerals 2) salmon and other fatty fish - reduces inflammation and oxylipins 3) swiss chard (leafy green)- high level of nitrates 4) pumpkin seeds- one of the best natural sources of magnesium 5) Beans and lentils- high in fiber, magnesium, and potassium 6) Berries- high in flavonoids 7) Amaranth (whole grain, can be cooked similarly to rice and oats)- high in magnesium and fiber 8) Pistachios- even more effective at reducing BP than other nuts 9) Carrots- high in phenolic compounds that relax blood vessels and reduce inflammation 10) Celery- contain phthalides that relax tissues of arterial walls 11) Tomatoes- can also improve cholesterol and reduce risk of heart disease 12) Broccoli- good source of magnesium, calcium, and potassium 13) Greek yogurt:  high in potassium and calcium 14) Herbs and spices: Celery seed, cilantro, saffron, lemongrass, black cumin, ginseng, cinnamon, cardamom, sweet basil, and ginger 15) Chia and flax seeds- also help to lower cholesterol and blood sugar 16) Beets- high levels of nitrates that relax blood vessels  17) spinach and bananas- high in potassium  -Provided lise of supplements that can help with hypertension:  1) magnesium: one high quality brand is Bioptemizers since it contains all 7 types of magnesium, otherwise over the counter magnesium gluconate 400mg  is a good option 2) B vitamins 3) vitamin D 4) potassium 5) CoQ10 6) L-arginine 7) Vitamin C 8) Beetroot -Educated that goal BP is 120/80. -Made goal  to incorporate some of the above foods into diet.    3) Cough -continue incentive spirometer.   4)  DVT: continue Eliquis until 04/10/21

## 2021-02-15 DIAGNOSIS — Z7982 Long term (current) use of aspirin: Secondary | ICD-10-CM | POA: Diagnosis not present

## 2021-02-15 DIAGNOSIS — I10 Essential (primary) hypertension: Secondary | ICD-10-CM | POA: Diagnosis not present

## 2021-02-15 DIAGNOSIS — E785 Hyperlipidemia, unspecified: Secondary | ICD-10-CM | POA: Diagnosis not present

## 2021-02-15 DIAGNOSIS — R1310 Dysphagia, unspecified: Secondary | ICD-10-CM | POA: Diagnosis not present

## 2021-02-15 DIAGNOSIS — I2510 Atherosclerotic heart disease of native coronary artery without angina pectoris: Secondary | ICD-10-CM | POA: Diagnosis not present

## 2021-02-15 DIAGNOSIS — I63511 Cerebral infarction due to unspecified occlusion or stenosis of right middle cerebral artery: Secondary | ICD-10-CM | POA: Diagnosis not present

## 2021-02-15 DIAGNOSIS — G4700 Insomnia, unspecified: Secondary | ICD-10-CM | POA: Diagnosis not present

## 2021-02-15 DIAGNOSIS — K5909 Other constipation: Secondary | ICD-10-CM | POA: Diagnosis not present

## 2021-02-15 DIAGNOSIS — M1612 Unilateral primary osteoarthritis, left hip: Secondary | ICD-10-CM | POA: Diagnosis not present

## 2021-02-15 DIAGNOSIS — N400 Enlarged prostate without lower urinary tract symptoms: Secondary | ICD-10-CM | POA: Diagnosis not present

## 2021-02-15 DIAGNOSIS — Z87891 Personal history of nicotine dependence: Secondary | ICD-10-CM | POA: Diagnosis not present

## 2021-02-15 DIAGNOSIS — R7303 Prediabetes: Secondary | ICD-10-CM | POA: Diagnosis not present

## 2021-02-15 DIAGNOSIS — I69354 Hemiplegia and hemiparesis following cerebral infarction affecting left non-dominant side: Secondary | ICD-10-CM | POA: Diagnosis not present

## 2021-02-15 DIAGNOSIS — K219 Gastro-esophageal reflux disease without esophagitis: Secondary | ICD-10-CM | POA: Diagnosis not present

## 2021-02-15 DIAGNOSIS — I82442 Acute embolism and thrombosis of left tibial vein: Secondary | ICD-10-CM | POA: Diagnosis not present

## 2021-02-15 DIAGNOSIS — Z7902 Long term (current) use of antithrombotics/antiplatelets: Secondary | ICD-10-CM | POA: Diagnosis not present

## 2021-02-16 DIAGNOSIS — E785 Hyperlipidemia, unspecified: Secondary | ICD-10-CM | POA: Diagnosis not present

## 2021-02-16 DIAGNOSIS — R1310 Dysphagia, unspecified: Secondary | ICD-10-CM | POA: Diagnosis not present

## 2021-02-16 DIAGNOSIS — R7303 Prediabetes: Secondary | ICD-10-CM | POA: Diagnosis not present

## 2021-02-16 DIAGNOSIS — I69354 Hemiplegia and hemiparesis following cerebral infarction affecting left non-dominant side: Secondary | ICD-10-CM | POA: Diagnosis not present

## 2021-02-16 DIAGNOSIS — N400 Enlarged prostate without lower urinary tract symptoms: Secondary | ICD-10-CM | POA: Diagnosis not present

## 2021-02-16 DIAGNOSIS — Z87891 Personal history of nicotine dependence: Secondary | ICD-10-CM | POA: Diagnosis not present

## 2021-02-16 DIAGNOSIS — M1612 Unilateral primary osteoarthritis, left hip: Secondary | ICD-10-CM | POA: Diagnosis not present

## 2021-02-16 DIAGNOSIS — Z7902 Long term (current) use of antithrombotics/antiplatelets: Secondary | ICD-10-CM | POA: Diagnosis not present

## 2021-02-16 DIAGNOSIS — I82442 Acute embolism and thrombosis of left tibial vein: Secondary | ICD-10-CM | POA: Diagnosis not present

## 2021-02-16 DIAGNOSIS — K219 Gastro-esophageal reflux disease without esophagitis: Secondary | ICD-10-CM | POA: Diagnosis not present

## 2021-02-16 DIAGNOSIS — I10 Essential (primary) hypertension: Secondary | ICD-10-CM | POA: Diagnosis not present

## 2021-02-16 DIAGNOSIS — K5909 Other constipation: Secondary | ICD-10-CM | POA: Diagnosis not present

## 2021-02-16 DIAGNOSIS — I2510 Atherosclerotic heart disease of native coronary artery without angina pectoris: Secondary | ICD-10-CM | POA: Diagnosis not present

## 2021-02-16 DIAGNOSIS — I63511 Cerebral infarction due to unspecified occlusion or stenosis of right middle cerebral artery: Secondary | ICD-10-CM | POA: Diagnosis not present

## 2021-02-16 DIAGNOSIS — G4700 Insomnia, unspecified: Secondary | ICD-10-CM | POA: Diagnosis not present

## 2021-02-16 DIAGNOSIS — Z7982 Long term (current) use of aspirin: Secondary | ICD-10-CM | POA: Diagnosis not present

## 2021-02-17 DIAGNOSIS — R1310 Dysphagia, unspecified: Secondary | ICD-10-CM | POA: Diagnosis not present

## 2021-02-17 DIAGNOSIS — E785 Hyperlipidemia, unspecified: Secondary | ICD-10-CM | POA: Diagnosis not present

## 2021-02-17 DIAGNOSIS — N400 Enlarged prostate without lower urinary tract symptoms: Secondary | ICD-10-CM | POA: Diagnosis not present

## 2021-02-17 DIAGNOSIS — I63511 Cerebral infarction due to unspecified occlusion or stenosis of right middle cerebral artery: Secondary | ICD-10-CM | POA: Diagnosis not present

## 2021-02-17 DIAGNOSIS — G4700 Insomnia, unspecified: Secondary | ICD-10-CM | POA: Diagnosis not present

## 2021-02-17 DIAGNOSIS — I69354 Hemiplegia and hemiparesis following cerebral infarction affecting left non-dominant side: Secondary | ICD-10-CM | POA: Diagnosis not present

## 2021-02-17 DIAGNOSIS — Z7902 Long term (current) use of antithrombotics/antiplatelets: Secondary | ICD-10-CM | POA: Diagnosis not present

## 2021-02-17 DIAGNOSIS — I10 Essential (primary) hypertension: Secondary | ICD-10-CM | POA: Diagnosis not present

## 2021-02-17 DIAGNOSIS — M1612 Unilateral primary osteoarthritis, left hip: Secondary | ICD-10-CM | POA: Diagnosis not present

## 2021-02-17 DIAGNOSIS — K5909 Other constipation: Secondary | ICD-10-CM | POA: Diagnosis not present

## 2021-02-17 DIAGNOSIS — K219 Gastro-esophageal reflux disease without esophagitis: Secondary | ICD-10-CM | POA: Diagnosis not present

## 2021-02-17 DIAGNOSIS — I82442 Acute embolism and thrombosis of left tibial vein: Secondary | ICD-10-CM | POA: Diagnosis not present

## 2021-02-17 DIAGNOSIS — R7303 Prediabetes: Secondary | ICD-10-CM | POA: Diagnosis not present

## 2021-02-17 DIAGNOSIS — Z7982 Long term (current) use of aspirin: Secondary | ICD-10-CM | POA: Diagnosis not present

## 2021-02-17 DIAGNOSIS — I2510 Atherosclerotic heart disease of native coronary artery without angina pectoris: Secondary | ICD-10-CM | POA: Diagnosis not present

## 2021-02-17 DIAGNOSIS — Z87891 Personal history of nicotine dependence: Secondary | ICD-10-CM | POA: Diagnosis not present

## 2021-02-18 DIAGNOSIS — I69354 Hemiplegia and hemiparesis following cerebral infarction affecting left non-dominant side: Secondary | ICD-10-CM | POA: Diagnosis not present

## 2021-02-18 DIAGNOSIS — I82442 Acute embolism and thrombosis of left tibial vein: Secondary | ICD-10-CM | POA: Diagnosis not present

## 2021-02-18 DIAGNOSIS — Z7902 Long term (current) use of antithrombotics/antiplatelets: Secondary | ICD-10-CM | POA: Diagnosis not present

## 2021-02-18 DIAGNOSIS — K5909 Other constipation: Secondary | ICD-10-CM | POA: Diagnosis not present

## 2021-02-18 DIAGNOSIS — N400 Enlarged prostate without lower urinary tract symptoms: Secondary | ICD-10-CM | POA: Diagnosis not present

## 2021-02-18 DIAGNOSIS — Z7982 Long term (current) use of aspirin: Secondary | ICD-10-CM | POA: Diagnosis not present

## 2021-02-18 DIAGNOSIS — M1612 Unilateral primary osteoarthritis, left hip: Secondary | ICD-10-CM | POA: Diagnosis not present

## 2021-02-18 DIAGNOSIS — I2510 Atherosclerotic heart disease of native coronary artery without angina pectoris: Secondary | ICD-10-CM | POA: Diagnosis not present

## 2021-02-18 DIAGNOSIS — Z87891 Personal history of nicotine dependence: Secondary | ICD-10-CM | POA: Diagnosis not present

## 2021-02-18 DIAGNOSIS — R7303 Prediabetes: Secondary | ICD-10-CM | POA: Diagnosis not present

## 2021-02-18 DIAGNOSIS — E785 Hyperlipidemia, unspecified: Secondary | ICD-10-CM | POA: Diagnosis not present

## 2021-02-18 DIAGNOSIS — I10 Essential (primary) hypertension: Secondary | ICD-10-CM | POA: Diagnosis not present

## 2021-02-18 DIAGNOSIS — R1310 Dysphagia, unspecified: Secondary | ICD-10-CM | POA: Diagnosis not present

## 2021-02-18 DIAGNOSIS — G4700 Insomnia, unspecified: Secondary | ICD-10-CM | POA: Diagnosis not present

## 2021-02-18 DIAGNOSIS — I63511 Cerebral infarction due to unspecified occlusion or stenosis of right middle cerebral artery: Secondary | ICD-10-CM | POA: Diagnosis not present

## 2021-02-18 DIAGNOSIS — K219 Gastro-esophageal reflux disease without esophagitis: Secondary | ICD-10-CM | POA: Diagnosis not present

## 2021-02-21 DIAGNOSIS — N400 Enlarged prostate without lower urinary tract symptoms: Secondary | ICD-10-CM | POA: Diagnosis not present

## 2021-02-21 DIAGNOSIS — I69354 Hemiplegia and hemiparesis following cerebral infarction affecting left non-dominant side: Secondary | ICD-10-CM | POA: Diagnosis not present

## 2021-02-21 DIAGNOSIS — Z7982 Long term (current) use of aspirin: Secondary | ICD-10-CM | POA: Diagnosis not present

## 2021-02-21 DIAGNOSIS — K219 Gastro-esophageal reflux disease without esophagitis: Secondary | ICD-10-CM | POA: Diagnosis not present

## 2021-02-21 DIAGNOSIS — Z6822 Body mass index (BMI) 22.0-22.9, adult: Secondary | ICD-10-CM | POA: Diagnosis not present

## 2021-02-21 DIAGNOSIS — R1310 Dysphagia, unspecified: Secondary | ICD-10-CM | POA: Diagnosis not present

## 2021-02-21 DIAGNOSIS — I82442 Acute embolism and thrombosis of left tibial vein: Secondary | ICD-10-CM | POA: Diagnosis not present

## 2021-02-21 DIAGNOSIS — I63511 Cerebral infarction due to unspecified occlusion or stenosis of right middle cerebral artery: Secondary | ICD-10-CM | POA: Diagnosis not present

## 2021-02-21 DIAGNOSIS — F4321 Adjustment disorder with depressed mood: Secondary | ICD-10-CM | POA: Diagnosis not present

## 2021-02-21 DIAGNOSIS — M1612 Unilateral primary osteoarthritis, left hip: Secondary | ICD-10-CM | POA: Diagnosis not present

## 2021-02-21 DIAGNOSIS — Z7902 Long term (current) use of antithrombotics/antiplatelets: Secondary | ICD-10-CM | POA: Diagnosis not present

## 2021-02-21 DIAGNOSIS — R7303 Prediabetes: Secondary | ICD-10-CM | POA: Diagnosis not present

## 2021-02-21 DIAGNOSIS — K5909 Other constipation: Secondary | ICD-10-CM | POA: Diagnosis not present

## 2021-02-21 DIAGNOSIS — Z87891 Personal history of nicotine dependence: Secondary | ICD-10-CM | POA: Diagnosis not present

## 2021-02-21 DIAGNOSIS — I2510 Atherosclerotic heart disease of native coronary artery without angina pectoris: Secondary | ICD-10-CM | POA: Diagnosis not present

## 2021-02-21 DIAGNOSIS — I10 Essential (primary) hypertension: Secondary | ICD-10-CM | POA: Diagnosis not present

## 2021-02-21 DIAGNOSIS — E785 Hyperlipidemia, unspecified: Secondary | ICD-10-CM | POA: Diagnosis not present

## 2021-02-21 DIAGNOSIS — G4700 Insomnia, unspecified: Secondary | ICD-10-CM | POA: Diagnosis not present

## 2021-02-22 DIAGNOSIS — G4700 Insomnia, unspecified: Secondary | ICD-10-CM | POA: Diagnosis not present

## 2021-02-22 DIAGNOSIS — E785 Hyperlipidemia, unspecified: Secondary | ICD-10-CM | POA: Diagnosis not present

## 2021-02-22 DIAGNOSIS — K219 Gastro-esophageal reflux disease without esophagitis: Secondary | ICD-10-CM | POA: Diagnosis not present

## 2021-02-22 DIAGNOSIS — Z7982 Long term (current) use of aspirin: Secondary | ICD-10-CM | POA: Diagnosis not present

## 2021-02-22 DIAGNOSIS — R7303 Prediabetes: Secondary | ICD-10-CM | POA: Diagnosis not present

## 2021-02-22 DIAGNOSIS — I69354 Hemiplegia and hemiparesis following cerebral infarction affecting left non-dominant side: Secondary | ICD-10-CM | POA: Diagnosis not present

## 2021-02-22 DIAGNOSIS — I82442 Acute embolism and thrombosis of left tibial vein: Secondary | ICD-10-CM | POA: Diagnosis not present

## 2021-02-22 DIAGNOSIS — N400 Enlarged prostate without lower urinary tract symptoms: Secondary | ICD-10-CM | POA: Diagnosis not present

## 2021-02-22 DIAGNOSIS — M1612 Unilateral primary osteoarthritis, left hip: Secondary | ICD-10-CM | POA: Diagnosis not present

## 2021-02-22 DIAGNOSIS — Z7902 Long term (current) use of antithrombotics/antiplatelets: Secondary | ICD-10-CM | POA: Diagnosis not present

## 2021-02-22 DIAGNOSIS — Z87891 Personal history of nicotine dependence: Secondary | ICD-10-CM | POA: Diagnosis not present

## 2021-02-22 DIAGNOSIS — I2510 Atherosclerotic heart disease of native coronary artery without angina pectoris: Secondary | ICD-10-CM | POA: Diagnosis not present

## 2021-02-22 DIAGNOSIS — I63511 Cerebral infarction due to unspecified occlusion or stenosis of right middle cerebral artery: Secondary | ICD-10-CM | POA: Diagnosis not present

## 2021-02-22 DIAGNOSIS — I10 Essential (primary) hypertension: Secondary | ICD-10-CM | POA: Diagnosis not present

## 2021-02-22 DIAGNOSIS — K5909 Other constipation: Secondary | ICD-10-CM | POA: Diagnosis not present

## 2021-02-22 DIAGNOSIS — R1310 Dysphagia, unspecified: Secondary | ICD-10-CM | POA: Diagnosis not present

## 2021-02-24 DIAGNOSIS — I69354 Hemiplegia and hemiparesis following cerebral infarction affecting left non-dominant side: Secondary | ICD-10-CM | POA: Diagnosis not present

## 2021-02-24 DIAGNOSIS — R7303 Prediabetes: Secondary | ICD-10-CM | POA: Diagnosis not present

## 2021-02-24 DIAGNOSIS — Z7902 Long term (current) use of antithrombotics/antiplatelets: Secondary | ICD-10-CM | POA: Diagnosis not present

## 2021-02-24 DIAGNOSIS — I2510 Atherosclerotic heart disease of native coronary artery without angina pectoris: Secondary | ICD-10-CM | POA: Diagnosis not present

## 2021-02-24 DIAGNOSIS — I63511 Cerebral infarction due to unspecified occlusion or stenosis of right middle cerebral artery: Secondary | ICD-10-CM | POA: Diagnosis not present

## 2021-02-24 DIAGNOSIS — R1310 Dysphagia, unspecified: Secondary | ICD-10-CM | POA: Diagnosis not present

## 2021-02-24 DIAGNOSIS — G4700 Insomnia, unspecified: Secondary | ICD-10-CM | POA: Diagnosis not present

## 2021-02-24 DIAGNOSIS — N400 Enlarged prostate without lower urinary tract symptoms: Secondary | ICD-10-CM | POA: Diagnosis not present

## 2021-02-24 DIAGNOSIS — Z7982 Long term (current) use of aspirin: Secondary | ICD-10-CM | POA: Diagnosis not present

## 2021-02-24 DIAGNOSIS — K219 Gastro-esophageal reflux disease without esophagitis: Secondary | ICD-10-CM | POA: Diagnosis not present

## 2021-02-24 DIAGNOSIS — M1612 Unilateral primary osteoarthritis, left hip: Secondary | ICD-10-CM | POA: Diagnosis not present

## 2021-02-24 DIAGNOSIS — Z87891 Personal history of nicotine dependence: Secondary | ICD-10-CM | POA: Diagnosis not present

## 2021-02-24 DIAGNOSIS — K5909 Other constipation: Secondary | ICD-10-CM | POA: Diagnosis not present

## 2021-02-24 DIAGNOSIS — E785 Hyperlipidemia, unspecified: Secondary | ICD-10-CM | POA: Diagnosis not present

## 2021-02-24 DIAGNOSIS — I10 Essential (primary) hypertension: Secondary | ICD-10-CM | POA: Diagnosis not present

## 2021-02-24 DIAGNOSIS — I82442 Acute embolism and thrombosis of left tibial vein: Secondary | ICD-10-CM | POA: Diagnosis not present

## 2021-02-25 DIAGNOSIS — Z87891 Personal history of nicotine dependence: Secondary | ICD-10-CM | POA: Diagnosis not present

## 2021-02-25 DIAGNOSIS — G4700 Insomnia, unspecified: Secondary | ICD-10-CM | POA: Diagnosis not present

## 2021-02-25 DIAGNOSIS — E785 Hyperlipidemia, unspecified: Secondary | ICD-10-CM | POA: Diagnosis not present

## 2021-02-25 DIAGNOSIS — Z7902 Long term (current) use of antithrombotics/antiplatelets: Secondary | ICD-10-CM | POA: Diagnosis not present

## 2021-02-25 DIAGNOSIS — R7303 Prediabetes: Secondary | ICD-10-CM | POA: Diagnosis not present

## 2021-02-25 DIAGNOSIS — Z7982 Long term (current) use of aspirin: Secondary | ICD-10-CM | POA: Diagnosis not present

## 2021-02-25 DIAGNOSIS — I63511 Cerebral infarction due to unspecified occlusion or stenosis of right middle cerebral artery: Secondary | ICD-10-CM | POA: Diagnosis not present

## 2021-02-25 DIAGNOSIS — N400 Enlarged prostate without lower urinary tract symptoms: Secondary | ICD-10-CM | POA: Diagnosis not present

## 2021-02-25 DIAGNOSIS — M1612 Unilateral primary osteoarthritis, left hip: Secondary | ICD-10-CM | POA: Diagnosis not present

## 2021-02-25 DIAGNOSIS — R1310 Dysphagia, unspecified: Secondary | ICD-10-CM | POA: Diagnosis not present

## 2021-02-25 DIAGNOSIS — I2510 Atherosclerotic heart disease of native coronary artery without angina pectoris: Secondary | ICD-10-CM | POA: Diagnosis not present

## 2021-02-25 DIAGNOSIS — K5909 Other constipation: Secondary | ICD-10-CM | POA: Diagnosis not present

## 2021-02-25 DIAGNOSIS — I69354 Hemiplegia and hemiparesis following cerebral infarction affecting left non-dominant side: Secondary | ICD-10-CM | POA: Diagnosis not present

## 2021-02-25 DIAGNOSIS — I82442 Acute embolism and thrombosis of left tibial vein: Secondary | ICD-10-CM | POA: Diagnosis not present

## 2021-02-25 DIAGNOSIS — I10 Essential (primary) hypertension: Secondary | ICD-10-CM | POA: Diagnosis not present

## 2021-02-25 DIAGNOSIS — K219 Gastro-esophageal reflux disease without esophagitis: Secondary | ICD-10-CM | POA: Diagnosis not present

## 2021-02-28 DIAGNOSIS — N400 Enlarged prostate without lower urinary tract symptoms: Secondary | ICD-10-CM | POA: Diagnosis not present

## 2021-02-28 DIAGNOSIS — G4700 Insomnia, unspecified: Secondary | ICD-10-CM | POA: Diagnosis not present

## 2021-02-28 DIAGNOSIS — I82442 Acute embolism and thrombosis of left tibial vein: Secondary | ICD-10-CM | POA: Diagnosis not present

## 2021-02-28 DIAGNOSIS — E785 Hyperlipidemia, unspecified: Secondary | ICD-10-CM | POA: Diagnosis not present

## 2021-02-28 DIAGNOSIS — R1310 Dysphagia, unspecified: Secondary | ICD-10-CM | POA: Diagnosis not present

## 2021-02-28 DIAGNOSIS — Z7982 Long term (current) use of aspirin: Secondary | ICD-10-CM | POA: Diagnosis not present

## 2021-02-28 DIAGNOSIS — I2510 Atherosclerotic heart disease of native coronary artery without angina pectoris: Secondary | ICD-10-CM | POA: Diagnosis not present

## 2021-02-28 DIAGNOSIS — I69354 Hemiplegia and hemiparesis following cerebral infarction affecting left non-dominant side: Secondary | ICD-10-CM | POA: Diagnosis not present

## 2021-02-28 DIAGNOSIS — K219 Gastro-esophageal reflux disease without esophagitis: Secondary | ICD-10-CM | POA: Diagnosis not present

## 2021-02-28 DIAGNOSIS — W19XXXA Unspecified fall, initial encounter: Secondary | ICD-10-CM | POA: Diagnosis not present

## 2021-02-28 DIAGNOSIS — I63511 Cerebral infarction due to unspecified occlusion or stenosis of right middle cerebral artery: Secondary | ICD-10-CM | POA: Diagnosis not present

## 2021-02-28 DIAGNOSIS — S300XXA Contusion of lower back and pelvis, initial encounter: Secondary | ICD-10-CM | POA: Diagnosis not present

## 2021-02-28 DIAGNOSIS — I10 Essential (primary) hypertension: Secondary | ICD-10-CM | POA: Diagnosis not present

## 2021-02-28 DIAGNOSIS — K5909 Other constipation: Secondary | ICD-10-CM | POA: Diagnosis not present

## 2021-02-28 DIAGNOSIS — Z87891 Personal history of nicotine dependence: Secondary | ICD-10-CM | POA: Diagnosis not present

## 2021-02-28 DIAGNOSIS — Z7902 Long term (current) use of antithrombotics/antiplatelets: Secondary | ICD-10-CM | POA: Diagnosis not present

## 2021-02-28 DIAGNOSIS — R7303 Prediabetes: Secondary | ICD-10-CM | POA: Diagnosis not present

## 2021-02-28 DIAGNOSIS — M1612 Unilateral primary osteoarthritis, left hip: Secondary | ICD-10-CM | POA: Diagnosis not present

## 2021-03-01 DIAGNOSIS — I2510 Atherosclerotic heart disease of native coronary artery without angina pectoris: Secondary | ICD-10-CM | POA: Diagnosis not present

## 2021-03-01 DIAGNOSIS — I63511 Cerebral infarction due to unspecified occlusion or stenosis of right middle cerebral artery: Secondary | ICD-10-CM | POA: Diagnosis not present

## 2021-03-01 DIAGNOSIS — N400 Enlarged prostate without lower urinary tract symptoms: Secondary | ICD-10-CM | POA: Diagnosis not present

## 2021-03-01 DIAGNOSIS — I10 Essential (primary) hypertension: Secondary | ICD-10-CM | POA: Diagnosis not present

## 2021-03-01 DIAGNOSIS — G4700 Insomnia, unspecified: Secondary | ICD-10-CM | POA: Diagnosis not present

## 2021-03-01 DIAGNOSIS — I82442 Acute embolism and thrombosis of left tibial vein: Secondary | ICD-10-CM | POA: Diagnosis not present

## 2021-03-01 DIAGNOSIS — Z87891 Personal history of nicotine dependence: Secondary | ICD-10-CM | POA: Diagnosis not present

## 2021-03-01 DIAGNOSIS — R1310 Dysphagia, unspecified: Secondary | ICD-10-CM | POA: Diagnosis not present

## 2021-03-01 DIAGNOSIS — M1612 Unilateral primary osteoarthritis, left hip: Secondary | ICD-10-CM | POA: Diagnosis not present

## 2021-03-01 DIAGNOSIS — Z7982 Long term (current) use of aspirin: Secondary | ICD-10-CM | POA: Diagnosis not present

## 2021-03-01 DIAGNOSIS — K219 Gastro-esophageal reflux disease without esophagitis: Secondary | ICD-10-CM | POA: Diagnosis not present

## 2021-03-01 DIAGNOSIS — K5909 Other constipation: Secondary | ICD-10-CM | POA: Diagnosis not present

## 2021-03-01 DIAGNOSIS — E785 Hyperlipidemia, unspecified: Secondary | ICD-10-CM | POA: Diagnosis not present

## 2021-03-01 DIAGNOSIS — I69354 Hemiplegia and hemiparesis following cerebral infarction affecting left non-dominant side: Secondary | ICD-10-CM | POA: Diagnosis not present

## 2021-03-01 DIAGNOSIS — R7303 Prediabetes: Secondary | ICD-10-CM | POA: Diagnosis not present

## 2021-03-01 DIAGNOSIS — Z7902 Long term (current) use of antithrombotics/antiplatelets: Secondary | ICD-10-CM | POA: Diagnosis not present

## 2021-03-02 DIAGNOSIS — I63511 Cerebral infarction due to unspecified occlusion or stenosis of right middle cerebral artery: Secondary | ICD-10-CM | POA: Diagnosis not present

## 2021-03-02 DIAGNOSIS — K219 Gastro-esophageal reflux disease without esophagitis: Secondary | ICD-10-CM | POA: Diagnosis not present

## 2021-03-02 DIAGNOSIS — I10 Essential (primary) hypertension: Secondary | ICD-10-CM | POA: Diagnosis not present

## 2021-03-02 DIAGNOSIS — E785 Hyperlipidemia, unspecified: Secondary | ICD-10-CM | POA: Diagnosis not present

## 2021-03-02 DIAGNOSIS — N400 Enlarged prostate without lower urinary tract symptoms: Secondary | ICD-10-CM | POA: Diagnosis not present

## 2021-03-02 DIAGNOSIS — G4700 Insomnia, unspecified: Secondary | ICD-10-CM | POA: Diagnosis not present

## 2021-03-02 DIAGNOSIS — I82442 Acute embolism and thrombosis of left tibial vein: Secondary | ICD-10-CM | POA: Diagnosis not present

## 2021-03-02 DIAGNOSIS — Z7902 Long term (current) use of antithrombotics/antiplatelets: Secondary | ICD-10-CM | POA: Diagnosis not present

## 2021-03-02 DIAGNOSIS — Z7982 Long term (current) use of aspirin: Secondary | ICD-10-CM | POA: Diagnosis not present

## 2021-03-02 DIAGNOSIS — I2510 Atherosclerotic heart disease of native coronary artery without angina pectoris: Secondary | ICD-10-CM | POA: Diagnosis not present

## 2021-03-02 DIAGNOSIS — R1310 Dysphagia, unspecified: Secondary | ICD-10-CM | POA: Diagnosis not present

## 2021-03-02 DIAGNOSIS — I69354 Hemiplegia and hemiparesis following cerebral infarction affecting left non-dominant side: Secondary | ICD-10-CM | POA: Diagnosis not present

## 2021-03-02 DIAGNOSIS — K5909 Other constipation: Secondary | ICD-10-CM | POA: Diagnosis not present

## 2021-03-02 DIAGNOSIS — R7303 Prediabetes: Secondary | ICD-10-CM | POA: Diagnosis not present

## 2021-03-02 DIAGNOSIS — M1612 Unilateral primary osteoarthritis, left hip: Secondary | ICD-10-CM | POA: Diagnosis not present

## 2021-03-02 DIAGNOSIS — Z87891 Personal history of nicotine dependence: Secondary | ICD-10-CM | POA: Diagnosis not present

## 2021-03-04 DIAGNOSIS — Z87891 Personal history of nicotine dependence: Secondary | ICD-10-CM | POA: Diagnosis not present

## 2021-03-04 DIAGNOSIS — I63511 Cerebral infarction due to unspecified occlusion or stenosis of right middle cerebral artery: Secondary | ICD-10-CM | POA: Diagnosis not present

## 2021-03-04 DIAGNOSIS — I2510 Atherosclerotic heart disease of native coronary artery without angina pectoris: Secondary | ICD-10-CM | POA: Diagnosis not present

## 2021-03-04 DIAGNOSIS — I82442 Acute embolism and thrombosis of left tibial vein: Secondary | ICD-10-CM | POA: Diagnosis not present

## 2021-03-04 DIAGNOSIS — R1310 Dysphagia, unspecified: Secondary | ICD-10-CM | POA: Diagnosis not present

## 2021-03-04 DIAGNOSIS — I10 Essential (primary) hypertension: Secondary | ICD-10-CM | POA: Diagnosis not present

## 2021-03-04 DIAGNOSIS — K219 Gastro-esophageal reflux disease without esophagitis: Secondary | ICD-10-CM | POA: Diagnosis not present

## 2021-03-04 DIAGNOSIS — Z7982 Long term (current) use of aspirin: Secondary | ICD-10-CM | POA: Diagnosis not present

## 2021-03-04 DIAGNOSIS — I69354 Hemiplegia and hemiparesis following cerebral infarction affecting left non-dominant side: Secondary | ICD-10-CM | POA: Diagnosis not present

## 2021-03-04 DIAGNOSIS — E785 Hyperlipidemia, unspecified: Secondary | ICD-10-CM | POA: Diagnosis not present

## 2021-03-04 DIAGNOSIS — R7303 Prediabetes: Secondary | ICD-10-CM | POA: Diagnosis not present

## 2021-03-04 DIAGNOSIS — Z7902 Long term (current) use of antithrombotics/antiplatelets: Secondary | ICD-10-CM | POA: Diagnosis not present

## 2021-03-04 DIAGNOSIS — K5909 Other constipation: Secondary | ICD-10-CM | POA: Diagnosis not present

## 2021-03-04 DIAGNOSIS — N400 Enlarged prostate without lower urinary tract symptoms: Secondary | ICD-10-CM | POA: Diagnosis not present

## 2021-03-04 DIAGNOSIS — G4700 Insomnia, unspecified: Secondary | ICD-10-CM | POA: Diagnosis not present

## 2021-03-04 DIAGNOSIS — M1612 Unilateral primary osteoarthritis, left hip: Secondary | ICD-10-CM | POA: Diagnosis not present

## 2021-03-08 DIAGNOSIS — E785 Hyperlipidemia, unspecified: Secondary | ICD-10-CM | POA: Diagnosis not present

## 2021-03-08 DIAGNOSIS — I69354 Hemiplegia and hemiparesis following cerebral infarction affecting left non-dominant side: Secondary | ICD-10-CM | POA: Diagnosis not present

## 2021-03-08 DIAGNOSIS — Z87891 Personal history of nicotine dependence: Secondary | ICD-10-CM | POA: Diagnosis not present

## 2021-03-08 DIAGNOSIS — Z7982 Long term (current) use of aspirin: Secondary | ICD-10-CM | POA: Diagnosis not present

## 2021-03-08 DIAGNOSIS — Z7902 Long term (current) use of antithrombotics/antiplatelets: Secondary | ICD-10-CM | POA: Diagnosis not present

## 2021-03-08 DIAGNOSIS — R1310 Dysphagia, unspecified: Secondary | ICD-10-CM | POA: Diagnosis not present

## 2021-03-08 DIAGNOSIS — K5909 Other constipation: Secondary | ICD-10-CM | POA: Diagnosis not present

## 2021-03-08 DIAGNOSIS — K219 Gastro-esophageal reflux disease without esophagitis: Secondary | ICD-10-CM | POA: Diagnosis not present

## 2021-03-08 DIAGNOSIS — I63511 Cerebral infarction due to unspecified occlusion or stenosis of right middle cerebral artery: Secondary | ICD-10-CM | POA: Diagnosis not present

## 2021-03-08 DIAGNOSIS — I82442 Acute embolism and thrombosis of left tibial vein: Secondary | ICD-10-CM | POA: Diagnosis not present

## 2021-03-08 DIAGNOSIS — R7303 Prediabetes: Secondary | ICD-10-CM | POA: Diagnosis not present

## 2021-03-08 DIAGNOSIS — G4700 Insomnia, unspecified: Secondary | ICD-10-CM | POA: Diagnosis not present

## 2021-03-08 DIAGNOSIS — I10 Essential (primary) hypertension: Secondary | ICD-10-CM | POA: Diagnosis not present

## 2021-03-08 DIAGNOSIS — M1612 Unilateral primary osteoarthritis, left hip: Secondary | ICD-10-CM | POA: Diagnosis not present

## 2021-03-08 DIAGNOSIS — N400 Enlarged prostate without lower urinary tract symptoms: Secondary | ICD-10-CM | POA: Diagnosis not present

## 2021-03-08 DIAGNOSIS — I2510 Atherosclerotic heart disease of native coronary artery without angina pectoris: Secondary | ICD-10-CM | POA: Diagnosis not present

## 2021-03-11 ENCOUNTER — Emergency Department (HOSPITAL_COMMUNITY): Payer: PPO

## 2021-03-11 ENCOUNTER — Emergency Department (HOSPITAL_COMMUNITY)
Admission: EM | Admit: 2021-03-11 | Discharge: 2021-03-11 | Disposition: A | Discharge status: Home / Self Care | Payer: PPO | Attending: Emergency Medicine | Admitting: Emergency Medicine

## 2021-03-11 ENCOUNTER — Encounter (HOSPITAL_COMMUNITY): Payer: Self-pay | Admitting: Emergency Medicine

## 2021-03-11 ENCOUNTER — Other Ambulatory Visit: Payer: Self-pay

## 2021-03-11 DIAGNOSIS — J323 Chronic sphenoidal sinusitis: Secondary | ICD-10-CM | POA: Diagnosis not present

## 2021-03-11 DIAGNOSIS — R42 Dizziness and giddiness: Secondary | ICD-10-CM | POA: Insufficient documentation

## 2021-03-11 DIAGNOSIS — I6523 Occlusion and stenosis of bilateral carotid arteries: Secondary | ICD-10-CM | POA: Diagnosis not present

## 2021-03-11 DIAGNOSIS — I10 Essential (primary) hypertension: Secondary | ICD-10-CM | POA: Diagnosis not present

## 2021-03-11 DIAGNOSIS — Z7982 Long term (current) use of aspirin: Secondary | ICD-10-CM | POA: Insufficient documentation

## 2021-03-11 DIAGNOSIS — Z79899 Other long term (current) drug therapy: Secondary | ICD-10-CM | POA: Insufficient documentation

## 2021-03-11 DIAGNOSIS — I6503 Occlusion and stenosis of bilateral vertebral arteries: Secondary | ICD-10-CM | POA: Diagnosis not present

## 2021-03-11 DIAGNOSIS — R0989 Other specified symptoms and signs involving the circulatory and respiratory systems: Secondary | ICD-10-CM | POA: Diagnosis not present

## 2021-03-11 DIAGNOSIS — I672 Cerebral atherosclerosis: Secondary | ICD-10-CM | POA: Diagnosis not present

## 2021-03-11 DIAGNOSIS — I6621 Occlusion and stenosis of right posterior cerebral artery: Secondary | ICD-10-CM | POA: Diagnosis not present

## 2021-03-11 LAB — CBC WITH DIFFERENTIAL/PLATELET
Abs Immature Granulocytes: 0.02 10*3/uL (ref 0.00–0.07)
Basophils Absolute: 0.1 10*3/uL (ref 0.0–0.1)
Basophils Relative: 1 %
Eosinophils Absolute: 0.1 10*3/uL (ref 0.0–0.5)
Eosinophils Relative: 1 %
HCT: 38.3 % — ABNORMAL LOW (ref 39.0–52.0)
Hemoglobin: 12.8 g/dL — ABNORMAL LOW (ref 13.0–17.0)
Immature Granulocytes: 0 %
Lymphocytes Relative: 22 %
Lymphs Abs: 1.6 10*3/uL (ref 0.7–4.0)
MCH: 27.6 pg (ref 26.0–34.0)
MCHC: 33.4 g/dL (ref 30.0–36.0)
MCV: 82.5 fL (ref 80.0–100.0)
Monocytes Absolute: 0.6 10*3/uL (ref 0.1–1.0)
Monocytes Relative: 8 %
Neutro Abs: 4.8 10*3/uL (ref 1.7–7.7)
Neutrophils Relative %: 68 %
Platelets: 303 10*3/uL (ref 150–400)
RBC: 4.64 MIL/uL (ref 4.22–5.81)
RDW: 14 % (ref 11.5–15.5)
WBC: 7.1 10*3/uL (ref 4.0–10.5)
nRBC: 0 % (ref 0.0–0.2)

## 2021-03-11 LAB — MAGNESIUM: Magnesium: 1.7 mg/dL (ref 1.7–2.4)

## 2021-03-11 LAB — COMPREHENSIVE METABOLIC PANEL
ALT: 11 U/L (ref 0–44)
AST: 25 U/L (ref 15–41)
Albumin: 3.6 g/dL (ref 3.5–5.0)
Alkaline Phosphatase: 56 U/L (ref 38–126)
Anion gap: 11 (ref 5–15)
BUN: 8 mg/dL (ref 8–23)
CO2: 24 mmol/L (ref 22–32)
Calcium: 9.1 mg/dL (ref 8.9–10.3)
Chloride: 96 mmol/L — ABNORMAL LOW (ref 98–111)
Creatinine, Ser: 0.85 mg/dL (ref 0.61–1.24)
GFR, Estimated: >60 mL/min (ref >60)
Glucose, Bld: 112 mg/dL — ABNORMAL HIGH (ref 70–99)
Potassium: 3.8 mmol/L (ref 3.5–5.1)
Sodium: 131 mmol/L — ABNORMAL LOW (ref 135–145)
Total Bilirubin: 0.5 mg/dL (ref 0.3–1.2)
Total Protein: 6 g/dL — ABNORMAL LOW (ref 6.5–8.1)

## 2021-03-11 MED ORDER — MECLIZINE HCL 25 MG PO TABS
25.0000 mg | ORAL_TABLET | Freq: Once | ORAL | Status: AC
  Administered 2021-03-11: 25 mg via ORAL
  Filled 2021-03-11: qty 1

## 2021-03-11 MED ORDER — HYDRALAZINE HCL 25 MG PO TABS
25.0000 mg | ORAL_TABLET | Freq: Once | ORAL | Status: AC
  Administered 2021-03-11: 25 mg via ORAL

## 2021-03-11 MED ORDER — IOHEXOL 350 MG/ML SOLN
75.0000 mL | Freq: Once | INTRAVENOUS | Status: AC | PRN
  Administered 2021-03-11: 75 mL via INTRAVENOUS

## 2021-03-11 MED ORDER — HYDRALAZINE HCL 25 MG PO TABS
25.0000 mg | ORAL_TABLET | Freq: Once | ORAL | Status: DC
  Filled 2021-03-11: qty 1

## 2021-03-11 MED ORDER — HYDRALAZINE HCL 20 MG/ML IJ SOLN: 10.0000 mg | Freq: Once | INTRAMUSCULAR | Status: DC

## 2021-03-11 NOTE — ED Notes (Signed)
Pt transported to CT ?

## 2021-03-11 NOTE — ED Triage Notes (Signed)
Pt BIB [facility], c/o sudden onset dizziness lasting 30 mins today. Hx stroke with left sided deficits. Symptoms have resolved, pt took asa at home. A&Ox4, no new neuro symptoms at this time. EMS BP 180/70. ?

## 2021-03-11 NOTE — ED Notes (Signed)
Report attempted, request to be call back gotten. ?

## 2021-03-11 NOTE — ED Provider Notes (Cosign Needed)
[facility] EMERGENCY DEPARTMENT  Provider Note  CSN: [id number] Arrival date & time: 03/11/21 1845  History Chief Complaint  Patient presents with   Dizziness    Joel Peck is a 86 y.o. male who presents today with lightheadedness.  Patient states that after eating a large meal tonight he had a brief episode of lightheadedness while sitting on his couch.  It has since resolved.  He is currently symptom-free.  Family was concerned because patient has a history of stroke.  They are not sure if this was related to that.  At no time did he have any weakness.  No difficulty with speech.  He is unsure if this is related to his blood pressure, stating that his pressure has been high recently.  No known illnesses.  No recent sick contacts.   Home Medications Prior to Admission medications   Medication Sig Start Date End Date Taking? Authorizing Provider  acetaminophen (TYLENOL) 325 MG tablet Take 650 mg by mouth every 6 (six) hours as needed for headache (pain).   Yes [provider]  amLODipine (NORVASC) 10 MG tablet Take 1 tablet (10 mg total) by mouth daily. 01/11/21  Yes Love, Ivan Anchors, PA-C  apixaban (ELIQUIS) 5 MG TABS tablet Take 1 tablet (5 mg total) by mouth 2 (two) times daily. 01/20/21  Yes Raulkar, Clide Deutscher, MD  aspirin EC 81 MG tablet Take 81 mg by mouth daily. Swallow whole.   Yes [provider]  docusate sodium (COLACE) 100 MG capsule Take 2 capsules (200 mg total) by mouth daily after supper. 01/11/21  Yes Love, Ivan Anchors, PA-C  DULoxetine (CYMBALTA) 30 MG capsule Take 30 mg by mouth daily. 02/21/21  Yes [provider]  gabapentin (NEURONTIN) 100 MG capsule Take 1 capsule (100 mg total) by mouth at bedtime as needed (toe burning). 01/11/21  Yes Love, Ivan Anchors, PA-C  hydrALAZINE (APRESOLINE) 25 MG tablet Take 25 mg by mouth 3 (three) times daily. 01/19/21  Yes [provider]  melatonin 5 MG TABS Take 1 tablet (5 mg total) by  mouth at bedtime. 01/11/21  Yes Love, Ivan Anchors, PA-C  metoprolol tartrate (LOPRESSOR) 50 MG tablet Take 1 tablet (50 mg total) by mouth 2 (two) times daily. 01/11/21  Yes Love, Ivan Anchors, PA-C  pantoprazole (PROTONIX) 40 MG tablet Take 1 tablet (40 mg total) by mouth daily. 01/12/21  Yes Love, Ivan Anchors, PA-C  rosuvastatin (CRESTOR) 40 MG tablet Take 1 tablet (40 mg total) by mouth at bedtime. 01/11/21  Yes Love, Ivan Anchors, PA-C  tamsulosin (FLOMAX) 0.4 MG CAPS capsule Take 1 capsule (0.4 mg total) by mouth daily. 01/11/21  Yes Love, Ivan Anchors, PA-C  benazepril (LOTENSIN) 20 MG tablet Take 20 mg by mouth 2 (two) times daily. Patient not taking: Reported on 03/11/2021    [provider]  citalopram (CELEXA) 10 MG tablet Take 10 mg by mouth daily. Patient not taking: Reported on 03/11/2021 02/11/21   [provider]  traZODone (DESYREL) 50 MG tablet Take 1 tablet (50 mg total) by mouth at bedtime. Patient not taking: Reported on 03/11/2021 01/11/21   Bary Leriche, PA-C     Allergies    Patient has no known allergies.   Review of Systems   Review of Systems  Constitutional:  Negative for chills and fever.  HENT:  Negative for ear pain and sore throat.   Eyes:  Negative for pain and visual disturbance.  Respiratory:  Negative for  cough and shortness of breath.   Cardiovascular:  Negative for chest pain and palpitations.  Gastrointestinal:  Negative for abdominal pain and vomiting.  Genitourinary:  Negative for dysuria and hematuria.  Musculoskeletal:  Negative for arthralgias and back pain.  Skin:  Negative for color change and rash.  Neurological:  Positive for light-headedness. Negative for seizures and syncope.  All other systems reviewed and are negative. Please see HPI for pertinent positives and negatives  Physical Exam BP (!) 191/78    Pulse 62    Temp 98.3 F (36.8 C)    Resp 15    Ht '5\' 7"'$  (1.702 m)    Wt 68 kg    SpO2 99%    BMI 23.49 kg/m   Physical Exam Vitals and  nursing note reviewed.  Constitutional:      General: He is not in acute distress.    Appearance: He is well-developed.  HENT:     Head: Normocephalic and atraumatic.  Eyes:     Conjunctiva/sclera: Conjunctivae normal.  Cardiovascular:     Rate and Rhythm: Normal rate and regular rhythm.     Heart sounds: No murmur heard. Pulmonary:     Effort: Pulmonary effort is normal. No respiratory distress.     Breath sounds: Normal breath sounds.  Abdominal:     Palpations: Abdomen is soft.     Tenderness: There is no abdominal tenderness.  Musculoskeletal:        General: No swelling.     Cervical back: Neck supple.  Skin:    General: Skin is warm and dry.     Capillary Refill: Capillary refill takes less than 2 seconds.  Neurological:     Mental Status: He is alert and oriented to person, place, and time. Mental status is at baseline.     Cranial Nerves: No cranial nerve deficit.     Sensory: No sensory deficit.     Coordination: Coordination normal.     Gait: Gait normal.     Comments: Patient with chronic left-sided deficits from previous stroke.  No acute changes based on previous exams.  Psychiatric:        Mood and Affect: Mood normal.    ED Results / Procedures / Treatments   EKG EKG Interpretation  Date/Time:  Friday March 11 2021 18:57:15 EST Ventricular Rate:  66 PR Interval:  180 QRS Duration: 100 QT Interval:  396 QTC Calculation: 415 R Axis:   42 Text Interpretation: Sinus rhythm Abnormal inferior Q waves No significant change since last tracing Confirmed by Wandra Arthurs [id number]) on 03/11/2021 7:11:21 PM  Procedures Procedures  Medications Ordered in the ED Medications  meclizine (ANTIVERT) tablet 25 mg (25 mg Oral Given 03/11/21 1950)  iohexol (OMNIPAQUE) 350 MG/ML injection 75 mL (75 mLs Intravenous Contrast Given 03/11/21 2149)  hydrALAZINE (APRESOLINE) tablet 25 mg (25 mg Oral Given 03/11/21 2316)     ED Course       MDM   This patient presents to  the ED for concern of lightheadedness, this involves an extensive number of treatment options, and is a complaint that carries with it a high risk of complications and morbidity.  The differential diagnosis includes stroke, vasovagal, metabolic abnormality. Patients presentation is complicated by their history of CVA.  Additional history obtained: Additional history obtained from family Records reviewed previous admission documents, Care Everywhere/External Records, and Primary Care Documents  Lab Tests: I Ordered, and personally interpreted labs.  The pertinent results include: Lab work stable from  previous.  No gross abnormalities.  Imaging Studies ordered: I ordered imaging studies including CT scan angiography of head and neck   I independently visualized and interpreted imaging which showed no acute changes from previous.  External carotid stenosis was noted. I agree with the radiologist interpretation  Medical Decision Making: Patient is an 86 year old male who presents today with lightheadedness.  He had a brief episode today after eating food.  Symptoms quickly resolved.  However family was concerned given that he has a history of recent stroke.  EMS was called.  On arrival he is hypertensive but his symptoms have resolved.  He states his blood pressures been running high.  Basic lab work was ordered and did not show any acute abnormalities to explain the lightheadedness.  His orthostatics were negative.  CTA was unchanged from previous.  I did discuss with Dr. Theda Sers from neurology regarding the findings in regards to his external carotid findings.  This is stable from previous.  He is on Eliquis to manage this.  Blood pressure remained stable while here.  I did give him his home medications while he was here.  He is asymptomatic.  Ambulated safely here.  We did discuss that he could wait to see if his blood pressure decreases at all, however he is comfortable with plan for discharge home.   He will take his blood pressure medications at home.  Complexity of problems addressed: Patients presentation is most consistent with  acute presentation with potential threat to life or bodily function  Disposition: After consideration of the diagnostic results and the patients response to treatment,  I feel that the patent would benefit from discharge home .   Patient seen in conjunction with my attending, Dr. Darl Householder.    Final Clinical Impression(s) / ED Diagnoses Final diagnoses:  Lightheadedness    Rx / DC Orders ED Discharge Orders     None         Jacelyn Pi, MD 03/11/21 2317

## 2021-03-14 DIAGNOSIS — Z7982 Long term (current) use of aspirin: Secondary | ICD-10-CM | POA: Diagnosis not present

## 2021-03-14 DIAGNOSIS — I2510 Atherosclerotic heart disease of native coronary artery without angina pectoris: Secondary | ICD-10-CM | POA: Diagnosis not present

## 2021-03-14 DIAGNOSIS — Z87891 Personal history of nicotine dependence: Secondary | ICD-10-CM | POA: Diagnosis not present

## 2021-03-14 DIAGNOSIS — R1310 Dysphagia, unspecified: Secondary | ICD-10-CM | POA: Diagnosis not present

## 2021-03-14 DIAGNOSIS — M1612 Unilateral primary osteoarthritis, left hip: Secondary | ICD-10-CM | POA: Diagnosis not present

## 2021-03-14 DIAGNOSIS — I69354 Hemiplegia and hemiparesis following cerebral infarction affecting left non-dominant side: Secondary | ICD-10-CM | POA: Diagnosis not present

## 2021-03-14 DIAGNOSIS — F32A Depression, unspecified: Secondary | ICD-10-CM | POA: Diagnosis not present

## 2021-03-14 DIAGNOSIS — K219 Gastro-esophageal reflux disease without esophagitis: Secondary | ICD-10-CM | POA: Diagnosis not present

## 2021-03-14 DIAGNOSIS — I10 Essential (primary) hypertension: Secondary | ICD-10-CM | POA: Diagnosis not present

## 2021-03-14 DIAGNOSIS — Z7902 Long term (current) use of antithrombotics/antiplatelets: Secondary | ICD-10-CM | POA: Diagnosis not present

## 2021-03-14 DIAGNOSIS — I82442 Acute embolism and thrombosis of left tibial vein: Secondary | ICD-10-CM | POA: Diagnosis not present

## 2021-03-14 DIAGNOSIS — E785 Hyperlipidemia, unspecified: Secondary | ICD-10-CM | POA: Diagnosis not present

## 2021-03-14 DIAGNOSIS — N400 Enlarged prostate without lower urinary tract symptoms: Secondary | ICD-10-CM | POA: Diagnosis not present

## 2021-03-14 DIAGNOSIS — K5909 Other constipation: Secondary | ICD-10-CM | POA: Diagnosis not present

## 2021-03-14 DIAGNOSIS — G4700 Insomnia, unspecified: Secondary | ICD-10-CM | POA: Diagnosis not present

## 2021-03-14 DIAGNOSIS — I6521 Occlusion and stenosis of right carotid artery: Secondary | ICD-10-CM | POA: Diagnosis not present

## 2021-03-14 DIAGNOSIS — R7303 Prediabetes: Secondary | ICD-10-CM | POA: Diagnosis not present

## 2021-03-16 DIAGNOSIS — I10 Essential (primary) hypertension: Secondary | ICD-10-CM | POA: Diagnosis not present

## 2021-03-16 DIAGNOSIS — Z6822 Body mass index (BMI) 22.0-22.9, adult: Secondary | ICD-10-CM | POA: Diagnosis not present

## 2021-03-16 DIAGNOSIS — G4700 Insomnia, unspecified: Secondary | ICD-10-CM | POA: Diagnosis not present

## 2021-03-16 DIAGNOSIS — F4321 Adjustment disorder with depressed mood: Secondary | ICD-10-CM | POA: Diagnosis not present

## 2021-03-19 DIAGNOSIS — N492 Inflammatory disorders of scrotum: Secondary | ICD-10-CM | POA: Diagnosis not present

## 2021-03-19 DIAGNOSIS — N3091 Cystitis, unspecified with hematuria: Secondary | ICD-10-CM | POA: Diagnosis not present

## 2021-03-21 DIAGNOSIS — N50811 Right testicular pain: Secondary | ICD-10-CM | POA: Diagnosis not present

## 2021-03-21 DIAGNOSIS — R972 Elevated prostate specific antigen [PSA]: Secondary | ICD-10-CM | POA: Diagnosis not present

## 2021-03-21 DIAGNOSIS — R351 Nocturia: Secondary | ICD-10-CM | POA: Diagnosis not present

## 2021-03-21 DIAGNOSIS — N5089 Other specified disorders of the male genital organs: Secondary | ICD-10-CM | POA: Diagnosis not present

## 2021-03-21 DIAGNOSIS — N451 Epididymitis: Secondary | ICD-10-CM | POA: Diagnosis not present

## 2021-03-21 DIAGNOSIS — N401 Enlarged prostate with lower urinary tract symptoms: Secondary | ICD-10-CM | POA: Diagnosis not present

## 2021-03-24 ENCOUNTER — Ambulatory Visit: Payer: PPO | Admitting: Neurology

## 2021-03-24 ENCOUNTER — Encounter: Payer: Self-pay | Admitting: Neurology

## 2021-03-24 VITALS — BP 112/72 | Ht 67.0 in | Wt 139.5 lb

## 2021-03-24 DIAGNOSIS — Z9889 Other specified postprocedural states: Secondary | ICD-10-CM | POA: Diagnosis not present

## 2021-03-24 DIAGNOSIS — I63511 Cerebral infarction due to unspecified occlusion or stenosis of right middle cerebral artery: Secondary | ICD-10-CM | POA: Diagnosis not present

## 2021-03-24 DIAGNOSIS — I63239 Cerebral infarction due to unspecified occlusion or stenosis of unspecified carotid arteries: Secondary | ICD-10-CM | POA: Diagnosis not present

## 2021-03-24 NOTE — Patient Instructions (Signed)
I had a long d/w patient about his recent stroke, carotid stenosis and endarterectomy,risk for recurrent stroke/TIAs, personally independently reviewed imaging studies and stroke evaluation results and answered questions.Continue aspirin 81 mg daily  for secondary stroke prevention and maintain strict control of hypertension with blood pressure goal below 130/90, diabetes with hemoglobin A1c goal below 6.5% and lipids with LDL cholesterol goal below 70 mg/dL. I also advised the patient to eat a healthy diet with plenty of whole grains, cereals, fruits and vegetables, exercise regularly and maintain ideal body weight . Continue f/u with Vascular Surgery.Followup in the future with my nurse practitioner in 3 months or call earlier if needed. ? ?Stroke Prevention ?Some medical conditions and behaviors can lead to a higher chance of having a stroke. You can help prevent a stroke by eating healthy, exercising, not smoking, and managing any medical conditions you have. ?Stroke is a leading cause of functional impairment. Primary prevention is particularly important because a majority of strokes are first-time events. Stroke changes the lives of not only those who experience a stroke but also their family and other caregivers. ?How can this condition affect me? ?A stroke is a medical emergency and should be treated right away. A stroke can lead to brain damage and can sometimes be life-threatening. If a person gets medical treatment right away, there is a better chance of surviving and recovering from a stroke. ?What can increase my risk? ?The following medical conditions may increase your risk of a stroke: ?Cardiovascular disease. ?High blood pressure (hypertension). ?Diabetes. ?High cholesterol. ?Sickle cell disease. ?Blood clotting disorders (hypercoagulable state). ?Obesity. ?Sleep disorders (obstructive sleep apnea). ?Other risk factors include: ?Being older than age 63. ?Having a history of blood clots, stroke, or  mini-stroke (transient ischemic attack, TIA). ?Genetic factors, such as race, ethnicity, or a family history of stroke. ?Smoking cigarettes or using other tobacco products. ?Taking birth control pills, especially if you also use tobacco. ?Heavy use of alcohol or drugs, especially cocaine and methamphetamine. ?Physical inactivity. ?What actions can I take to prevent this? ?Manage your health conditions ?High cholesterol levels. ?Eating a healthy diet is important for preventing high cholesterol. If cholesterol cannot be managed through diet alone, you may need to take medicines. ?Take any prescribed medicines to control your cholesterol as told by your health care provider. ?Hypertension. ?To reduce your risk of stroke, try to keep your blood pressure below 130/80. ?Eating a healthy diet and exercising regularly are important for controlling blood pressure. If these steps are not enough to manage your blood pressure, you may need to take medicines. ?Take any prescribed medicines to control hypertension as told by your health care provider. ?Ask your health care provider if you should monitor your blood pressure at home. ?Have your blood pressure checked every year, even if your blood pressure is normal. Blood pressure increases with age and some medical conditions. ?Diabetes. ?Eating a healthy diet and exercising regularly are important parts of managing your blood sugar (glucose). If your blood sugar cannot be managed through diet and exercise, you may need to take medicines. ?Take any prescribed medicines to control your diabetes as told by your health care provider. ?Get evaluated for obstructive sleep apnea. Talk to your health care provider about getting a sleep evaluation if you snore a lot or have excessive sleepiness. ?Make sure that any other medical conditions you have, such as atrial fibrillation or atherosclerosis, are managed. ?Nutrition ?Follow instructions from your health care provider about what to  eat or drink  to help manage your health condition. These instructions may include: ?Reducing your daily calorie intake. ?Limiting how much salt (sodium) you use to 1,500 milligrams (mg) each day. ?Using only healthy fats for cooking, such as olive oil, canola oil, or sunflower oil. ?Eating healthy foods. You can do this by: ?Choosing foods that are high in fiber, such as whole grains, and fresh fruits and vegetables. ?Eating at least 5 servings of fruits and vegetables a day. Try to fill one-half of your plate with fruits and vegetables at each meal. ?Choosing lean protein foods, such as lean cuts of meat, poultry without skin, fish, tofu, beans, and nuts. ?Eating low-fat dairy products. ?Avoiding foods that are high in sodium. This can help lower blood pressure. ?Avoiding foods that have saturated fat, trans fat, and cholesterol. This can help prevent high cholesterol. ?Avoiding processed and prepared foods. ?Counting your daily carbohydrate intake. ? ?Lifestyle ?If you drink alcohol: ?Limit how much you have to: ?0-1 drink a day for women who are not pregnant. ?0-2 drinks a day for men. ?Know how much alcohol is in your drink. In the [place] one drink equals one 12 oz bottle of beer (320m), one 5 oz glass of wine (1424m, or one 1? oz glass of hard liquor (447m ?Do not use any products that contain nicotine or tobacco. These products include cigarettes, chewing tobacco, and vaping devices, such as e-cigarettes. If you need help quitting, ask your health care provider. ?Avoid secondhand smoke. ?Do not use drugs. ?Activity ? ?Try to stay at a healthy weight. ?Get at least 30 minutes of exercise on most days, such as: ?Fast walking. ?Biking. ?Swimming. ?Medicines ?Take over-the-counter and prescription medicines only as told by your health care provider. Aspirin or blood thinners (antiplatelets or anticoagulants) may be recommended to reduce your risk of forming blood clots that can lead to stroke. ?Avoid taking  birth control pills. Talk to your health care provider about the risks of taking birth control pills if: ?You are over 35 77ars old. ?You smoke. ?You get very bad headaches. ?You have had a blood clot. ?Where to find more information ?American Stroke Association: www.strokeassociation.org ?Get help right away if: ?You or a loved one has any symptoms of a stroke. "BE FAST" is an easy way to remember the main warning signs of a stroke: ?B - Balance. Signs are dizziness, sudden trouble walking, or loss of balance. ?E - Eyes. Signs are trouble seeing or a sudden change in vision. ?F - Face. Signs are sudden weakness or numbness of the face, or the face or eyelid drooping on one side. ?A - Arms. Signs are weakness or numbness in an arm. This happens suddenly and usually on one side of the body. ?S - Speech. Signs are sudden trouble speaking, slurred speech, or trouble understanding what people say. ?T - Time. Time to call emergency services. Write down what time symptoms started. ?You or a loved one has other signs of a stroke, such as: ?A sudden, severe headache with no known cause. ?Nausea or vomiting. ?Seizure. ?These symptoms may represent a serious problem that is an emergency. Do not wait to see if the symptoms will go away. Get medical help right away. Call your local emergency services (911 in the [place] Do not drive yourself to the hospital. ?Summary ?You can help to prevent a stroke by eating healthy, exercising, not smoking, limiting alcohol intake, and managing any medical conditions you may have. ?Do not use any products that contain nicotine or  tobacco. These include cigarettes, chewing tobacco, and vaping devices, such as e-cigarettes. If you need help quitting, ask your health care provider. ?Remember "BE FAST" for warning signs of a stroke. Get help right away if you or a loved one has any of these signs. ?This information is not intended to replace advice given to you by your health care provider. Make  sure you discuss any questions you have with your health care provider. ?Document Revised: 07/21/2019 Document Reviewed: 07/21/2019 ?Elsevier Patient Education ? 2022 [place]. ? ? ?

## 2021-03-24 NOTE — Progress Notes (Signed)
?[facility] ?[address] ?[place] [address] ?[phone] ? ?     OFFICE FOLLOW-UP NOTE ? ?[name] ?Date of Birth:  [birth year] ?Medical Record Number:  [id number]  ? ?HPI: Joel Peck is a 86 year old Caucasian male seen today for initial office visit following hospital admission for stroke in December 2022.  He is accompanied by his daughter.  History is obtained from them and review of electronic medical records and opossum reviewed pertinent available imaging films in PACS.[name] is a 86 y.o. male with a past medical history significant for hypertension arthritis CAD who presents with left-sided weakness left facial droop and left hemianopsia.  Patient was seen at [facility] 12/10 for a 1 hour episode of left sided weakness. He was discharged 12/12/21 around 1330. Around 1700 he had a sudden onset of left-sided weakness.  EMS was called and he was brought to the emergency department.  Family states that left leg weakness had completely resolved.  When he was discharged from [facility] and he had no symptoms and he was discharged on Plavix. He received TNKase at 1753 and was transferred to [facility].  CT scan on admission showed no acute abnormality with aspect score of 10.  Repeat CT scan showed development of subtle low-density in the mesial temporal lobe hippocampus and inferior basal ganglia on the right side.  CT angiogram of the head and neck was negative for LVO but did show 55% distal CCA stenosis on the right with 50% stenosis of proximal right ICA with heavily calcification.  Carotid ultrasound was subsequently done which showed ?Only 1-39% right ICA stenosis but since it was heavily calcified it was felt to underrepresent the degree of stenosis and patient underwent elective right carotid endarterectomy by Dr. Doren Custard on 12/20/2020 uneventfully.  Patient's states is done well since discharge.  Is doing home physical occupational  therapy.  He can walk with a cane.  He has had no falls.  He is living at home with his daughter.  He had some difficulty with blood pressure fluctuation last week and was seen in the ER and his primary care physician is increase his blood pressure medicines which seem to be doing better now.  He had a CT scan done in the ER on 03/11/2021 which showed no acute abnormality and CT angiogram showed postoperative changes of right carotid endarterectomy without recurrent ICA stenosis.  There was severe bilateral vertebral ostial stenosis severe right external carotid artery stenosis.  Patient states he has been eating healthy and has lost some weight.  He was seen by his primary care physician who checked lipid profile which was satisfactory.  He was started on Eliquis for acute deep vein thrombosis involving posterior tibial veins on the left.  He is also on aspirin 81 mg for stroke prevention and tolerating both medications well without bruising or bleeding.  He did does have a scheduled follow-up to see vascular surgery as well.  No new complaints today. ?ROS:   ?14 system review of systems is positive for weakness, gait difficulty, bruising all other systems negative ? ?PMH:  ?Past Medical History:  ?Diagnosis Date  ? Arthritis   ? Coronary artery disease   ? Depression   ? Hypertension   ? ? ?Social History:  ?Social History  ? ?Socioeconomic History  ? Marital status: Married  ?  Spouse name: Not on file  ? Number of children: Not on file  ? Years of education:  Not on file  ? Highest education level: 12th grade  ?Occupational History  ? Not on file  ?Tobacco Use  ? Smoking status: Former  ? Smokeless tobacco: Never  ? Tobacco comments:  ?  Quit smoking 40 years ago  ?Vaping Use  ? Vaping Use: Never used  ?Substance and Sexual Activity  ? Alcohol use: No  ? Drug use: No  ? Sexual activity: Not on file  ?Other Topics Concern  ? Not on file  ?Social History Narrative  ? Lives with daughter   ? Some caffeine   ? ?Social  Determinants of Health  ? ?Financial Resource Strain: Not on file  ?Food Insecurity: Not on file  ?Transportation Needs: Not on file  ?Physical Activity: Not on file  ?Stress: Not on file  ?Social Connections: Not on file  ?Intimate Partner Violence: Not on file  ? ? ?Medications:   ?Current Outpatient Medications on File Prior to Visit  ?Medication Sig Dispense Refill  ? acetaminophen (TYLENOL) 325 MG tablet Take 650 mg by mouth every 6 (six) hours as needed for headache (pain).    ? amLODipine (NORVASC) 10 MG tablet Take 1 tablet (10 mg total) by mouth daily. 30 tablet 0  ? apixaban (ELIQUIS) 5 MG TABS tablet Take 1 tablet (5 mg total) by mouth 2 (two) times daily. 60 tablet 1  ? aspirin EC 81 MG tablet Take 81 mg by mouth daily. Swallow whole.    ? benazepril (LOTENSIN) 20 MG tablet Take 20 mg by mouth 2 (two) times daily.    ? citalopram (CELEXA) 10 MG tablet Take 10 mg by mouth daily.    ? docusate sodium (COLACE) 100 MG capsule Take 2 capsules (200 mg total) by mouth daily after supper. 60 capsule 0  ? DULoxetine (CYMBALTA) 30 MG capsule Take 30 mg by mouth daily.    ? Eszopiclone 3 MG TABS Take 3 mg by mouth at bedtime. Take immediately before bedtime    ? gabapentin (NEURONTIN) 100 MG capsule Take 1 capsule (100 mg total) by mouth at bedtime as needed (toe burning). 30 capsule 0  ? hydrALAZINE (APRESOLINE) 25 MG tablet Take 25 mg by mouth 3 (three) times daily.    ? melatonin 5 MG TABS Take 1 tablet (5 mg total) by mouth at bedtime. 30 tablet 0  ? metoprolol tartrate (LOPRESSOR) 50 MG tablet Take 1 tablet (50 mg total) by mouth 2 (two) times daily. 60 tablet 0  ? pantoprazole (PROTONIX) 40 MG tablet Take 1 tablet (40 mg total) by mouth daily. 30 tablet 0  ? rosuvastatin (CRESTOR) 40 MG tablet Take 1 tablet (40 mg total) by mouth at bedtime. 30 tablet 0  ? tamsulosin (FLOMAX) 0.4 MG CAPS capsule Take 1 capsule (0.4 mg total) by mouth daily. 30 capsule 0  ? traZODone (DESYREL) 50 MG tablet Take 1 tablet (50 mg  total) by mouth at bedtime. 30 tablet 0  ? ?No current facility-administered medications on file prior to visit.  ? ? ?Allergies:  No Known Allergies ? ?Physical Exam ?General: well developed, well nourished, pleasant elderly Caucasian male seated, in no evident distress ?Head: head normocephalic and atraumatic.  ?Neck: supple with no carotid or supraclavicular bruits ?Cardiovascular: regular rate and rhythm, no murmurs ?Musculoskeletal: no deformity ?Skin:  no rash/petichiae ?Vascular:  Normal pulses all extremities ?Vitals:  ? 03/24/21 1053  ?BP: 112/72  ? ?Neurologic Exam ?Mental Status: Awake and fully alert. Oriented to place and time. Recent and remote memory  intact. Attention span, concentration and fund of knowledge appropriate. Mood and affect appropriate.  ?Cranial Nerves: Fundoscopic exam reveals sharp disc margins. Pupils equal, briskly reactive to light. Extraocular movements full without nystagmus. Visual fields full to confrontation. Hearing intact. Facial sensation intact.  Slight left lower facial weakness when he smiles., tongue, palate moves normally and symmetrically.  ?Motor: Normal bulk and tone. Normal strength in all tested extremity muscles.  Mild weakness of left grip and intrinsic hand muscles.  Orbits right over left upper extremity.  Fine finger movements are diminished on the left.  Trace weakness of left hip flexors.  Tone is increased in the left leg. ?Sensory.: intact to touch ,pinprick .position and vibratory sensation.  ?Coordination: Rapid alternating movements normal in all extremities. Finger-to-nose and heel-to-shin performed accurately bilaterally. ?Gait and Station: Arises from chair without difficulty. Stance is normal. Gait demonstrates normal stride length and balance drags left leg slightly while walking due to increased tone..  Tandem walking not attempted. ?Reflexes: 1+ and symmetric. Toes downgoing.  ? ?NIHSS  1 ?Modified Rankin  2 ? ? ?ASSESSMENT: 86 year old  Caucasian male with scattered right MCA/PCA border zone and right ACA infarcts in December 2022 treated with thrombolysis with IV TNK with good clinical outcome.  Stroke etiology moderate proximal right carot

## 2021-03-25 DIAGNOSIS — I82442 Acute embolism and thrombosis of left tibial vein: Secondary | ICD-10-CM | POA: Diagnosis not present

## 2021-03-25 DIAGNOSIS — R7303 Prediabetes: Secondary | ICD-10-CM | POA: Diagnosis not present

## 2021-03-25 DIAGNOSIS — K219 Gastro-esophageal reflux disease without esophagitis: Secondary | ICD-10-CM | POA: Diagnosis not present

## 2021-03-25 DIAGNOSIS — R1310 Dysphagia, unspecified: Secondary | ICD-10-CM | POA: Diagnosis not present

## 2021-03-25 DIAGNOSIS — M1612 Unilateral primary osteoarthritis, left hip: Secondary | ICD-10-CM | POA: Diagnosis not present

## 2021-03-25 DIAGNOSIS — Z7982 Long term (current) use of aspirin: Secondary | ICD-10-CM | POA: Diagnosis not present

## 2021-03-25 DIAGNOSIS — F32A Depression, unspecified: Secondary | ICD-10-CM | POA: Diagnosis not present

## 2021-03-25 DIAGNOSIS — Z87891 Personal history of nicotine dependence: Secondary | ICD-10-CM | POA: Diagnosis not present

## 2021-03-25 DIAGNOSIS — N400 Enlarged prostate without lower urinary tract symptoms: Secondary | ICD-10-CM | POA: Diagnosis not present

## 2021-03-25 DIAGNOSIS — K5909 Other constipation: Secondary | ICD-10-CM | POA: Diagnosis not present

## 2021-03-25 DIAGNOSIS — Z7902 Long term (current) use of antithrombotics/antiplatelets: Secondary | ICD-10-CM | POA: Diagnosis not present

## 2021-03-25 DIAGNOSIS — I6521 Occlusion and stenosis of right carotid artery: Secondary | ICD-10-CM | POA: Diagnosis not present

## 2021-03-25 DIAGNOSIS — E785 Hyperlipidemia, unspecified: Secondary | ICD-10-CM | POA: Diagnosis not present

## 2021-03-25 DIAGNOSIS — I69354 Hemiplegia and hemiparesis following cerebral infarction affecting left non-dominant side: Secondary | ICD-10-CM | POA: Diagnosis not present

## 2021-03-25 DIAGNOSIS — I10 Essential (primary) hypertension: Secondary | ICD-10-CM | POA: Diagnosis not present

## 2021-03-25 DIAGNOSIS — I2510 Atherosclerotic heart disease of native coronary artery without angina pectoris: Secondary | ICD-10-CM | POA: Diagnosis not present

## 2021-03-25 DIAGNOSIS — G4700 Insomnia, unspecified: Secondary | ICD-10-CM | POA: Diagnosis not present

## 2021-03-28 ENCOUNTER — Other Ambulatory Visit: Payer: Self-pay | Admitting: Physical Medicine and Rehabilitation

## 2021-03-28 DIAGNOSIS — R1310 Dysphagia, unspecified: Secondary | ICD-10-CM | POA: Diagnosis not present

## 2021-03-28 DIAGNOSIS — Z87891 Personal history of nicotine dependence: Secondary | ICD-10-CM | POA: Diagnosis not present

## 2021-03-28 DIAGNOSIS — I6521 Occlusion and stenosis of right carotid artery: Secondary | ICD-10-CM | POA: Diagnosis not present

## 2021-03-28 DIAGNOSIS — M1612 Unilateral primary osteoarthritis, left hip: Secondary | ICD-10-CM | POA: Diagnosis not present

## 2021-03-28 DIAGNOSIS — K5909 Other constipation: Secondary | ICD-10-CM | POA: Diagnosis not present

## 2021-03-28 DIAGNOSIS — I2510 Atherosclerotic heart disease of native coronary artery without angina pectoris: Secondary | ICD-10-CM | POA: Diagnosis not present

## 2021-03-28 DIAGNOSIS — Z7902 Long term (current) use of antithrombotics/antiplatelets: Secondary | ICD-10-CM | POA: Diagnosis not present

## 2021-03-28 DIAGNOSIS — I10 Essential (primary) hypertension: Secondary | ICD-10-CM | POA: Diagnosis not present

## 2021-03-28 DIAGNOSIS — R7303 Prediabetes: Secondary | ICD-10-CM | POA: Diagnosis not present

## 2021-03-28 DIAGNOSIS — F32A Depression, unspecified: Secondary | ICD-10-CM | POA: Diagnosis not present

## 2021-03-28 DIAGNOSIS — K219 Gastro-esophageal reflux disease without esophagitis: Secondary | ICD-10-CM | POA: Diagnosis not present

## 2021-03-28 DIAGNOSIS — G4700 Insomnia, unspecified: Secondary | ICD-10-CM | POA: Diagnosis not present

## 2021-03-28 DIAGNOSIS — Z7982 Long term (current) use of aspirin: Secondary | ICD-10-CM | POA: Diagnosis not present

## 2021-03-28 DIAGNOSIS — E785 Hyperlipidemia, unspecified: Secondary | ICD-10-CM | POA: Diagnosis not present

## 2021-03-28 DIAGNOSIS — I69354 Hemiplegia and hemiparesis following cerebral infarction affecting left non-dominant side: Secondary | ICD-10-CM | POA: Diagnosis not present

## 2021-03-28 DIAGNOSIS — N400 Enlarged prostate without lower urinary tract symptoms: Secondary | ICD-10-CM | POA: Diagnosis not present

## 2021-03-28 DIAGNOSIS — I82442 Acute embolism and thrombosis of left tibial vein: Secondary | ICD-10-CM | POA: Diagnosis not present

## 2021-03-29 DIAGNOSIS — E785 Hyperlipidemia, unspecified: Secondary | ICD-10-CM | POA: Diagnosis not present

## 2021-03-29 DIAGNOSIS — I10 Essential (primary) hypertension: Secondary | ICD-10-CM | POA: Diagnosis not present

## 2021-03-29 DIAGNOSIS — F32A Depression, unspecified: Secondary | ICD-10-CM | POA: Diagnosis not present

## 2021-03-29 DIAGNOSIS — I6521 Occlusion and stenosis of right carotid artery: Secondary | ICD-10-CM | POA: Diagnosis not present

## 2021-03-29 DIAGNOSIS — G4700 Insomnia, unspecified: Secondary | ICD-10-CM | POA: Diagnosis not present

## 2021-03-29 DIAGNOSIS — K219 Gastro-esophageal reflux disease without esophagitis: Secondary | ICD-10-CM | POA: Diagnosis not present

## 2021-03-29 DIAGNOSIS — N400 Enlarged prostate without lower urinary tract symptoms: Secondary | ICD-10-CM | POA: Diagnosis not present

## 2021-03-29 DIAGNOSIS — I69354 Hemiplegia and hemiparesis following cerebral infarction affecting left non-dominant side: Secondary | ICD-10-CM | POA: Diagnosis not present

## 2021-03-29 DIAGNOSIS — K5909 Other constipation: Secondary | ICD-10-CM | POA: Diagnosis not present

## 2021-03-29 DIAGNOSIS — I82442 Acute embolism and thrombosis of left tibial vein: Secondary | ICD-10-CM | POA: Diagnosis not present

## 2021-03-29 DIAGNOSIS — M1612 Unilateral primary osteoarthritis, left hip: Secondary | ICD-10-CM | POA: Diagnosis not present

## 2021-03-29 DIAGNOSIS — Z7902 Long term (current) use of antithrombotics/antiplatelets: Secondary | ICD-10-CM | POA: Diagnosis not present

## 2021-03-29 DIAGNOSIS — Z87891 Personal history of nicotine dependence: Secondary | ICD-10-CM | POA: Diagnosis not present

## 2021-03-29 DIAGNOSIS — R7303 Prediabetes: Secondary | ICD-10-CM | POA: Diagnosis not present

## 2021-03-29 DIAGNOSIS — I2510 Atherosclerotic heart disease of native coronary artery without angina pectoris: Secondary | ICD-10-CM | POA: Diagnosis not present

## 2021-03-29 DIAGNOSIS — Z7982 Long term (current) use of aspirin: Secondary | ICD-10-CM | POA: Diagnosis not present

## 2021-03-29 DIAGNOSIS — R1310 Dysphagia, unspecified: Secondary | ICD-10-CM | POA: Diagnosis not present

## 2021-03-30 ENCOUNTER — Other Ambulatory Visit: Payer: Self-pay

## 2021-03-30 NOTE — Patient Outreach (Signed)
[facility]) Care Management ? ?03/30/2021 ? ?Joel Peck ?Apr 28, 1935 ?[id number] ? ? ?No telephone outreach to patient to obtain mRS. Was successfully completed by Dr. Leonie Man 03/24/21. MRS= 2 ? ?Joel Peck ?[place] Management Assistant ?[phone] ? ? ?

## 2021-03-31 DIAGNOSIS — R7303 Prediabetes: Secondary | ICD-10-CM | POA: Diagnosis not present

## 2021-03-31 DIAGNOSIS — R1310 Dysphagia, unspecified: Secondary | ICD-10-CM | POA: Diagnosis not present

## 2021-03-31 DIAGNOSIS — E785 Hyperlipidemia, unspecified: Secondary | ICD-10-CM | POA: Diagnosis not present

## 2021-03-31 DIAGNOSIS — N400 Enlarged prostate without lower urinary tract symptoms: Secondary | ICD-10-CM | POA: Diagnosis not present

## 2021-03-31 DIAGNOSIS — K5909 Other constipation: Secondary | ICD-10-CM | POA: Diagnosis not present

## 2021-03-31 DIAGNOSIS — Z7902 Long term (current) use of antithrombotics/antiplatelets: Secondary | ICD-10-CM | POA: Diagnosis not present

## 2021-03-31 DIAGNOSIS — I69354 Hemiplegia and hemiparesis following cerebral infarction affecting left non-dominant side: Secondary | ICD-10-CM | POA: Diagnosis not present

## 2021-03-31 DIAGNOSIS — I10 Essential (primary) hypertension: Secondary | ICD-10-CM | POA: Diagnosis not present

## 2021-03-31 DIAGNOSIS — I6521 Occlusion and stenosis of right carotid artery: Secondary | ICD-10-CM | POA: Diagnosis not present

## 2021-03-31 DIAGNOSIS — I2510 Atherosclerotic heart disease of native coronary artery without angina pectoris: Secondary | ICD-10-CM | POA: Diagnosis not present

## 2021-03-31 DIAGNOSIS — I82442 Acute embolism and thrombosis of left tibial vein: Secondary | ICD-10-CM | POA: Diagnosis not present

## 2021-03-31 DIAGNOSIS — M1612 Unilateral primary osteoarthritis, left hip: Secondary | ICD-10-CM | POA: Diagnosis not present

## 2021-03-31 DIAGNOSIS — Z7982 Long term (current) use of aspirin: Secondary | ICD-10-CM | POA: Diagnosis not present

## 2021-03-31 DIAGNOSIS — K219 Gastro-esophageal reflux disease without esophagitis: Secondary | ICD-10-CM | POA: Diagnosis not present

## 2021-03-31 DIAGNOSIS — G4700 Insomnia, unspecified: Secondary | ICD-10-CM | POA: Diagnosis not present

## 2021-03-31 DIAGNOSIS — F32A Depression, unspecified: Secondary | ICD-10-CM | POA: Diagnosis not present

## 2021-03-31 DIAGNOSIS — Z87891 Personal history of nicotine dependence: Secondary | ICD-10-CM | POA: Diagnosis not present

## 2021-04-01 DIAGNOSIS — I10 Essential (primary) hypertension: Secondary | ICD-10-CM | POA: Diagnosis not present

## 2021-04-01 DIAGNOSIS — E785 Hyperlipidemia, unspecified: Secondary | ICD-10-CM | POA: Diagnosis not present

## 2021-04-04 DIAGNOSIS — R1310 Dysphagia, unspecified: Secondary | ICD-10-CM | POA: Diagnosis not present

## 2021-04-04 DIAGNOSIS — I10 Essential (primary) hypertension: Secondary | ICD-10-CM | POA: Diagnosis not present

## 2021-04-04 DIAGNOSIS — F32A Depression, unspecified: Secondary | ICD-10-CM | POA: Diagnosis not present

## 2021-04-04 DIAGNOSIS — G4700 Insomnia, unspecified: Secondary | ICD-10-CM | POA: Diagnosis not present

## 2021-04-04 DIAGNOSIS — K219 Gastro-esophageal reflux disease without esophagitis: Secondary | ICD-10-CM | POA: Diagnosis not present

## 2021-04-04 DIAGNOSIS — Z7902 Long term (current) use of antithrombotics/antiplatelets: Secondary | ICD-10-CM | POA: Diagnosis not present

## 2021-04-04 DIAGNOSIS — I69354 Hemiplegia and hemiparesis following cerebral infarction affecting left non-dominant side: Secondary | ICD-10-CM | POA: Diagnosis not present

## 2021-04-04 DIAGNOSIS — M1612 Unilateral primary osteoarthritis, left hip: Secondary | ICD-10-CM | POA: Diagnosis not present

## 2021-04-04 DIAGNOSIS — N400 Enlarged prostate without lower urinary tract symptoms: Secondary | ICD-10-CM | POA: Diagnosis not present

## 2021-04-04 DIAGNOSIS — Z7982 Long term (current) use of aspirin: Secondary | ICD-10-CM | POA: Diagnosis not present

## 2021-04-04 DIAGNOSIS — I82442 Acute embolism and thrombosis of left tibial vein: Secondary | ICD-10-CM | POA: Diagnosis not present

## 2021-04-04 DIAGNOSIS — E785 Hyperlipidemia, unspecified: Secondary | ICD-10-CM | POA: Diagnosis not present

## 2021-04-04 DIAGNOSIS — K5909 Other constipation: Secondary | ICD-10-CM | POA: Diagnosis not present

## 2021-04-04 DIAGNOSIS — I2510 Atherosclerotic heart disease of native coronary artery without angina pectoris: Secondary | ICD-10-CM | POA: Diagnosis not present

## 2021-04-04 DIAGNOSIS — I6521 Occlusion and stenosis of right carotid artery: Secondary | ICD-10-CM | POA: Diagnosis not present

## 2021-04-04 DIAGNOSIS — Z87891 Personal history of nicotine dependence: Secondary | ICD-10-CM | POA: Diagnosis not present

## 2021-04-04 DIAGNOSIS — R7303 Prediabetes: Secondary | ICD-10-CM | POA: Diagnosis not present

## 2021-04-05 DIAGNOSIS — N451 Epididymitis: Secondary | ICD-10-CM | POA: Diagnosis not present

## 2021-04-05 DIAGNOSIS — N5089 Other specified disorders of the male genital organs: Secondary | ICD-10-CM | POA: Diagnosis not present

## 2021-04-05 DIAGNOSIS — N50811 Right testicular pain: Secondary | ICD-10-CM | POA: Diagnosis not present

## 2021-04-05 DIAGNOSIS — N503 Cyst of epididymis: Secondary | ICD-10-CM | POA: Diagnosis not present

## 2021-04-05 DIAGNOSIS — N433 Hydrocele, unspecified: Secondary | ICD-10-CM | POA: Diagnosis not present

## 2021-04-07 DIAGNOSIS — I82442 Acute embolism and thrombosis of left tibial vein: Secondary | ICD-10-CM | POA: Diagnosis not present

## 2021-04-07 DIAGNOSIS — N400 Enlarged prostate without lower urinary tract symptoms: Secondary | ICD-10-CM | POA: Diagnosis not present

## 2021-04-07 DIAGNOSIS — Z7982 Long term (current) use of aspirin: Secondary | ICD-10-CM | POA: Diagnosis not present

## 2021-04-07 DIAGNOSIS — I6521 Occlusion and stenosis of right carotid artery: Secondary | ICD-10-CM | POA: Diagnosis not present

## 2021-04-07 DIAGNOSIS — M1612 Unilateral primary osteoarthritis, left hip: Secondary | ICD-10-CM | POA: Diagnosis not present

## 2021-04-07 DIAGNOSIS — Z7902 Long term (current) use of antithrombotics/antiplatelets: Secondary | ICD-10-CM | POA: Diagnosis not present

## 2021-04-07 DIAGNOSIS — R1310 Dysphagia, unspecified: Secondary | ICD-10-CM | POA: Diagnosis not present

## 2021-04-07 DIAGNOSIS — R7303 Prediabetes: Secondary | ICD-10-CM | POA: Diagnosis not present

## 2021-04-07 DIAGNOSIS — Z87891 Personal history of nicotine dependence: Secondary | ICD-10-CM | POA: Diagnosis not present

## 2021-04-07 DIAGNOSIS — E785 Hyperlipidemia, unspecified: Secondary | ICD-10-CM | POA: Diagnosis not present

## 2021-04-07 DIAGNOSIS — I2510 Atherosclerotic heart disease of native coronary artery without angina pectoris: Secondary | ICD-10-CM | POA: Diagnosis not present

## 2021-04-07 DIAGNOSIS — I10 Essential (primary) hypertension: Secondary | ICD-10-CM | POA: Diagnosis not present

## 2021-04-07 DIAGNOSIS — G4700 Insomnia, unspecified: Secondary | ICD-10-CM | POA: Diagnosis not present

## 2021-04-07 DIAGNOSIS — F32A Depression, unspecified: Secondary | ICD-10-CM | POA: Diagnosis not present

## 2021-04-07 DIAGNOSIS — I69354 Hemiplegia and hemiparesis following cerebral infarction affecting left non-dominant side: Secondary | ICD-10-CM | POA: Diagnosis not present

## 2021-04-07 DIAGNOSIS — K219 Gastro-esophageal reflux disease without esophagitis: Secondary | ICD-10-CM | POA: Diagnosis not present

## 2021-04-07 DIAGNOSIS — K5909 Other constipation: Secondary | ICD-10-CM | POA: Diagnosis not present

## 2021-04-14 DIAGNOSIS — R1310 Dysphagia, unspecified: Secondary | ICD-10-CM | POA: Diagnosis not present

## 2021-04-14 DIAGNOSIS — I2510 Atherosclerotic heart disease of native coronary artery without angina pectoris: Secondary | ICD-10-CM | POA: Diagnosis not present

## 2021-04-14 DIAGNOSIS — I6521 Occlusion and stenosis of right carotid artery: Secondary | ICD-10-CM | POA: Diagnosis not present

## 2021-04-14 DIAGNOSIS — I10 Essential (primary) hypertension: Secondary | ICD-10-CM | POA: Diagnosis not present

## 2021-04-14 DIAGNOSIS — Z7902 Long term (current) use of antithrombotics/antiplatelets: Secondary | ICD-10-CM | POA: Diagnosis not present

## 2021-04-14 DIAGNOSIS — E785 Hyperlipidemia, unspecified: Secondary | ICD-10-CM | POA: Diagnosis not present

## 2021-04-14 DIAGNOSIS — I82442 Acute embolism and thrombosis of left tibial vein: Secondary | ICD-10-CM | POA: Diagnosis not present

## 2021-04-14 DIAGNOSIS — I69354 Hemiplegia and hemiparesis following cerebral infarction affecting left non-dominant side: Secondary | ICD-10-CM | POA: Diagnosis not present

## 2021-04-14 DIAGNOSIS — G4700 Insomnia, unspecified: Secondary | ICD-10-CM | POA: Diagnosis not present

## 2021-04-14 DIAGNOSIS — R7303 Prediabetes: Secondary | ICD-10-CM | POA: Diagnosis not present

## 2021-04-14 DIAGNOSIS — N400 Enlarged prostate without lower urinary tract symptoms: Secondary | ICD-10-CM | POA: Diagnosis not present

## 2021-04-14 DIAGNOSIS — F32A Depression, unspecified: Secondary | ICD-10-CM | POA: Diagnosis not present

## 2021-04-14 DIAGNOSIS — K5909 Other constipation: Secondary | ICD-10-CM | POA: Diagnosis not present

## 2021-04-14 DIAGNOSIS — M1612 Unilateral primary osteoarthritis, left hip: Secondary | ICD-10-CM | POA: Diagnosis not present

## 2021-04-14 DIAGNOSIS — K219 Gastro-esophageal reflux disease without esophagitis: Secondary | ICD-10-CM | POA: Diagnosis not present

## 2021-04-14 DIAGNOSIS — Z87891 Personal history of nicotine dependence: Secondary | ICD-10-CM | POA: Diagnosis not present

## 2021-04-14 DIAGNOSIS — Z7982 Long term (current) use of aspirin: Secondary | ICD-10-CM | POA: Diagnosis not present

## 2021-04-18 DIAGNOSIS — Z6821 Body mass index (BMI) 21.0-21.9, adult: Secondary | ICD-10-CM | POA: Diagnosis not present

## 2021-04-18 DIAGNOSIS — I10 Essential (primary) hypertension: Secondary | ICD-10-CM | POA: Diagnosis not present

## 2021-04-18 DIAGNOSIS — G629 Polyneuropathy, unspecified: Secondary | ICD-10-CM | POA: Diagnosis not present

## 2021-04-20 DIAGNOSIS — I69354 Hemiplegia and hemiparesis following cerebral infarction affecting left non-dominant side: Secondary | ICD-10-CM | POA: Diagnosis not present

## 2021-04-20 DIAGNOSIS — K5909 Other constipation: Secondary | ICD-10-CM | POA: Diagnosis not present

## 2021-04-20 DIAGNOSIS — N400 Enlarged prostate without lower urinary tract symptoms: Secondary | ICD-10-CM | POA: Diagnosis not present

## 2021-04-20 DIAGNOSIS — R1310 Dysphagia, unspecified: Secondary | ICD-10-CM | POA: Diagnosis not present

## 2021-04-20 DIAGNOSIS — Z7982 Long term (current) use of aspirin: Secondary | ICD-10-CM | POA: Diagnosis not present

## 2021-04-20 DIAGNOSIS — M1612 Unilateral primary osteoarthritis, left hip: Secondary | ICD-10-CM | POA: Diagnosis not present

## 2021-04-20 DIAGNOSIS — I10 Essential (primary) hypertension: Secondary | ICD-10-CM | POA: Diagnosis not present

## 2021-04-20 DIAGNOSIS — F32A Depression, unspecified: Secondary | ICD-10-CM | POA: Diagnosis not present

## 2021-04-20 DIAGNOSIS — E785 Hyperlipidemia, unspecified: Secondary | ICD-10-CM | POA: Diagnosis not present

## 2021-04-20 DIAGNOSIS — Z7902 Long term (current) use of antithrombotics/antiplatelets: Secondary | ICD-10-CM | POA: Diagnosis not present

## 2021-04-20 DIAGNOSIS — R7303 Prediabetes: Secondary | ICD-10-CM | POA: Diagnosis not present

## 2021-04-20 DIAGNOSIS — I6521 Occlusion and stenosis of right carotid artery: Secondary | ICD-10-CM | POA: Diagnosis not present

## 2021-04-20 DIAGNOSIS — Z87891 Personal history of nicotine dependence: Secondary | ICD-10-CM | POA: Diagnosis not present

## 2021-04-20 DIAGNOSIS — G4700 Insomnia, unspecified: Secondary | ICD-10-CM | POA: Diagnosis not present

## 2021-04-20 DIAGNOSIS — K219 Gastro-esophageal reflux disease without esophagitis: Secondary | ICD-10-CM | POA: Diagnosis not present

## 2021-04-20 DIAGNOSIS — I82442 Acute embolism and thrombosis of left tibial vein: Secondary | ICD-10-CM | POA: Diagnosis not present

## 2021-04-20 DIAGNOSIS — I2510 Atherosclerotic heart disease of native coronary artery without angina pectoris: Secondary | ICD-10-CM | POA: Diagnosis not present

## 2021-04-22 DIAGNOSIS — Z7902 Long term (current) use of antithrombotics/antiplatelets: Secondary | ICD-10-CM | POA: Diagnosis not present

## 2021-04-22 DIAGNOSIS — I69354 Hemiplegia and hemiparesis following cerebral infarction affecting left non-dominant side: Secondary | ICD-10-CM | POA: Diagnosis not present

## 2021-04-22 DIAGNOSIS — I82442 Acute embolism and thrombosis of left tibial vein: Secondary | ICD-10-CM | POA: Diagnosis not present

## 2021-04-22 DIAGNOSIS — K5909 Other constipation: Secondary | ICD-10-CM | POA: Diagnosis not present

## 2021-04-22 DIAGNOSIS — F32A Depression, unspecified: Secondary | ICD-10-CM | POA: Diagnosis not present

## 2021-04-22 DIAGNOSIS — R7303 Prediabetes: Secondary | ICD-10-CM | POA: Diagnosis not present

## 2021-04-22 DIAGNOSIS — K219 Gastro-esophageal reflux disease without esophagitis: Secondary | ICD-10-CM | POA: Diagnosis not present

## 2021-04-22 DIAGNOSIS — I2510 Atherosclerotic heart disease of native coronary artery without angina pectoris: Secondary | ICD-10-CM | POA: Diagnosis not present

## 2021-04-22 DIAGNOSIS — N400 Enlarged prostate without lower urinary tract symptoms: Secondary | ICD-10-CM | POA: Diagnosis not present

## 2021-04-22 DIAGNOSIS — E785 Hyperlipidemia, unspecified: Secondary | ICD-10-CM | POA: Diagnosis not present

## 2021-04-22 DIAGNOSIS — Z7982 Long term (current) use of aspirin: Secondary | ICD-10-CM | POA: Diagnosis not present

## 2021-04-22 DIAGNOSIS — M1612 Unilateral primary osteoarthritis, left hip: Secondary | ICD-10-CM | POA: Diagnosis not present

## 2021-04-22 DIAGNOSIS — Z87891 Personal history of nicotine dependence: Secondary | ICD-10-CM | POA: Diagnosis not present

## 2021-04-22 DIAGNOSIS — R1310 Dysphagia, unspecified: Secondary | ICD-10-CM | POA: Diagnosis not present

## 2021-04-22 DIAGNOSIS — I6521 Occlusion and stenosis of right carotid artery: Secondary | ICD-10-CM | POA: Diagnosis not present

## 2021-04-22 DIAGNOSIS — I10 Essential (primary) hypertension: Secondary | ICD-10-CM | POA: Diagnosis not present

## 2021-04-22 DIAGNOSIS — G4700 Insomnia, unspecified: Secondary | ICD-10-CM | POA: Diagnosis not present

## 2021-04-25 DIAGNOSIS — F32A Depression, unspecified: Secondary | ICD-10-CM | POA: Diagnosis not present

## 2021-04-25 DIAGNOSIS — G4700 Insomnia, unspecified: Secondary | ICD-10-CM | POA: Diagnosis not present

## 2021-04-25 DIAGNOSIS — I2510 Atherosclerotic heart disease of native coronary artery without angina pectoris: Secondary | ICD-10-CM | POA: Diagnosis not present

## 2021-04-25 DIAGNOSIS — K5909 Other constipation: Secondary | ICD-10-CM | POA: Diagnosis not present

## 2021-04-25 DIAGNOSIS — N400 Enlarged prostate without lower urinary tract symptoms: Secondary | ICD-10-CM | POA: Diagnosis not present

## 2021-04-25 DIAGNOSIS — R7303 Prediabetes: Secondary | ICD-10-CM | POA: Diagnosis not present

## 2021-04-25 DIAGNOSIS — R1310 Dysphagia, unspecified: Secondary | ICD-10-CM | POA: Diagnosis not present

## 2021-04-25 DIAGNOSIS — Z87891 Personal history of nicotine dependence: Secondary | ICD-10-CM | POA: Diagnosis not present

## 2021-04-25 DIAGNOSIS — K219 Gastro-esophageal reflux disease without esophagitis: Secondary | ICD-10-CM | POA: Diagnosis not present

## 2021-04-25 DIAGNOSIS — Z7982 Long term (current) use of aspirin: Secondary | ICD-10-CM | POA: Diagnosis not present

## 2021-04-25 DIAGNOSIS — I6521 Occlusion and stenosis of right carotid artery: Secondary | ICD-10-CM | POA: Diagnosis not present

## 2021-04-25 DIAGNOSIS — E785 Hyperlipidemia, unspecified: Secondary | ICD-10-CM | POA: Diagnosis not present

## 2021-04-25 DIAGNOSIS — I69354 Hemiplegia and hemiparesis following cerebral infarction affecting left non-dominant side: Secondary | ICD-10-CM | POA: Diagnosis not present

## 2021-04-25 DIAGNOSIS — M1612 Unilateral primary osteoarthritis, left hip: Secondary | ICD-10-CM | POA: Diagnosis not present

## 2021-04-25 DIAGNOSIS — Z7902 Long term (current) use of antithrombotics/antiplatelets: Secondary | ICD-10-CM | POA: Diagnosis not present

## 2021-04-25 DIAGNOSIS — I82442 Acute embolism and thrombosis of left tibial vein: Secondary | ICD-10-CM | POA: Diagnosis not present

## 2021-04-25 DIAGNOSIS — I10 Essential (primary) hypertension: Secondary | ICD-10-CM | POA: Diagnosis not present

## 2021-04-26 DIAGNOSIS — N5089 Other specified disorders of the male genital organs: Secondary | ICD-10-CM | POA: Diagnosis not present

## 2021-04-26 DIAGNOSIS — R351 Nocturia: Secondary | ICD-10-CM | POA: Diagnosis not present

## 2021-04-26 DIAGNOSIS — N401 Enlarged prostate with lower urinary tract symptoms: Secondary | ICD-10-CM | POA: Diagnosis not present

## 2021-04-26 DIAGNOSIS — N451 Epididymitis: Secondary | ICD-10-CM | POA: Diagnosis not present

## 2021-04-26 DIAGNOSIS — N50811 Right testicular pain: Secondary | ICD-10-CM | POA: Diagnosis not present

## 2021-04-26 DIAGNOSIS — R972 Elevated prostate specific antigen [PSA]: Secondary | ICD-10-CM | POA: Diagnosis not present

## 2021-05-09 DIAGNOSIS — I82442 Acute embolism and thrombosis of left tibial vein: Secondary | ICD-10-CM | POA: Diagnosis not present

## 2021-05-09 DIAGNOSIS — R1310 Dysphagia, unspecified: Secondary | ICD-10-CM | POA: Diagnosis not present

## 2021-05-09 DIAGNOSIS — I6521 Occlusion and stenosis of right carotid artery: Secondary | ICD-10-CM | POA: Diagnosis not present

## 2021-05-09 DIAGNOSIS — I69354 Hemiplegia and hemiparesis following cerebral infarction affecting left non-dominant side: Secondary | ICD-10-CM | POA: Diagnosis not present

## 2021-05-12 DIAGNOSIS — I6521 Occlusion and stenosis of right carotid artery: Secondary | ICD-10-CM | POA: Diagnosis not present

## 2021-05-12 DIAGNOSIS — I82442 Acute embolism and thrombosis of left tibial vein: Secondary | ICD-10-CM | POA: Diagnosis not present

## 2021-05-12 DIAGNOSIS — R1310 Dysphagia, unspecified: Secondary | ICD-10-CM | POA: Diagnosis not present

## 2021-05-12 DIAGNOSIS — I69354 Hemiplegia and hemiparesis following cerebral infarction affecting left non-dominant side: Secondary | ICD-10-CM | POA: Diagnosis not present

## 2021-05-15 DIAGNOSIS — H8110 Benign paroxysmal vertigo, unspecified ear: Secondary | ICD-10-CM | POA: Diagnosis not present

## 2021-05-17 DIAGNOSIS — I82442 Acute embolism and thrombosis of left tibial vein: Secondary | ICD-10-CM | POA: Diagnosis not present

## 2021-05-17 DIAGNOSIS — I6521 Occlusion and stenosis of right carotid artery: Secondary | ICD-10-CM | POA: Diagnosis not present

## 2021-05-17 DIAGNOSIS — R42 Dizziness and giddiness: Secondary | ICD-10-CM | POA: Diagnosis not present

## 2021-05-17 DIAGNOSIS — H8111 Benign paroxysmal vertigo, right ear: Secondary | ICD-10-CM | POA: Diagnosis not present

## 2021-05-17 DIAGNOSIS — R1310 Dysphagia, unspecified: Secondary | ICD-10-CM | POA: Diagnosis not present

## 2021-05-17 DIAGNOSIS — I69354 Hemiplegia and hemiparesis following cerebral infarction affecting left non-dominant side: Secondary | ICD-10-CM | POA: Diagnosis not present

## 2021-05-20 DIAGNOSIS — I69354 Hemiplegia and hemiparesis following cerebral infarction affecting left non-dominant side: Secondary | ICD-10-CM | POA: Diagnosis not present

## 2021-05-20 DIAGNOSIS — R42 Dizziness and giddiness: Secondary | ICD-10-CM | POA: Diagnosis not present

## 2021-05-20 DIAGNOSIS — I82442 Acute embolism and thrombosis of left tibial vein: Secondary | ICD-10-CM | POA: Diagnosis not present

## 2021-05-20 DIAGNOSIS — H8111 Benign paroxysmal vertigo, right ear: Secondary | ICD-10-CM | POA: Diagnosis not present

## 2021-05-20 DIAGNOSIS — R1310 Dysphagia, unspecified: Secondary | ICD-10-CM | POA: Diagnosis not present

## 2021-05-20 DIAGNOSIS — I6521 Occlusion and stenosis of right carotid artery: Secondary | ICD-10-CM | POA: Diagnosis not present

## 2021-05-25 DIAGNOSIS — R42 Dizziness and giddiness: Secondary | ICD-10-CM | POA: Diagnosis not present

## 2021-05-25 DIAGNOSIS — R1310 Dysphagia, unspecified: Secondary | ICD-10-CM | POA: Diagnosis not present

## 2021-05-25 DIAGNOSIS — H8111 Benign paroxysmal vertigo, right ear: Secondary | ICD-10-CM | POA: Diagnosis not present

## 2021-05-25 DIAGNOSIS — I6521 Occlusion and stenosis of right carotid artery: Secondary | ICD-10-CM | POA: Diagnosis not present

## 2021-05-25 DIAGNOSIS — I82442 Acute embolism and thrombosis of left tibial vein: Secondary | ICD-10-CM | POA: Diagnosis not present

## 2021-05-25 DIAGNOSIS — I69354 Hemiplegia and hemiparesis following cerebral infarction affecting left non-dominant side: Secondary | ICD-10-CM | POA: Diagnosis not present

## 2021-05-27 DIAGNOSIS — H8111 Benign paroxysmal vertigo, right ear: Secondary | ICD-10-CM | POA: Diagnosis not present

## 2021-05-27 DIAGNOSIS — I69354 Hemiplegia and hemiparesis following cerebral infarction affecting left non-dominant side: Secondary | ICD-10-CM | POA: Diagnosis not present

## 2021-05-27 DIAGNOSIS — R1310 Dysphagia, unspecified: Secondary | ICD-10-CM | POA: Diagnosis not present

## 2021-05-27 DIAGNOSIS — R42 Dizziness and giddiness: Secondary | ICD-10-CM | POA: Diagnosis not present

## 2021-05-27 DIAGNOSIS — I6521 Occlusion and stenosis of right carotid artery: Secondary | ICD-10-CM | POA: Diagnosis not present

## 2021-05-27 DIAGNOSIS — I82442 Acute embolism and thrombosis of left tibial vein: Secondary | ICD-10-CM | POA: Diagnosis not present

## 2021-06-01 DIAGNOSIS — I6521 Occlusion and stenosis of right carotid artery: Secondary | ICD-10-CM | POA: Diagnosis not present

## 2021-06-01 DIAGNOSIS — R1310 Dysphagia, unspecified: Secondary | ICD-10-CM | POA: Diagnosis not present

## 2021-06-01 DIAGNOSIS — H8111 Benign paroxysmal vertigo, right ear: Secondary | ICD-10-CM | POA: Diagnosis not present

## 2021-06-01 DIAGNOSIS — I69354 Hemiplegia and hemiparesis following cerebral infarction affecting left non-dominant side: Secondary | ICD-10-CM | POA: Diagnosis not present

## 2021-06-01 DIAGNOSIS — I82442 Acute embolism and thrombosis of left tibial vein: Secondary | ICD-10-CM | POA: Diagnosis not present

## 2021-06-01 DIAGNOSIS — R42 Dizziness and giddiness: Secondary | ICD-10-CM | POA: Diagnosis not present

## 2021-06-03 DIAGNOSIS — R1310 Dysphagia, unspecified: Secondary | ICD-10-CM | POA: Diagnosis not present

## 2021-06-03 DIAGNOSIS — I6521 Occlusion and stenosis of right carotid artery: Secondary | ICD-10-CM | POA: Diagnosis not present

## 2021-06-03 DIAGNOSIS — R42 Dizziness and giddiness: Secondary | ICD-10-CM | POA: Diagnosis not present

## 2021-06-03 DIAGNOSIS — H8111 Benign paroxysmal vertigo, right ear: Secondary | ICD-10-CM | POA: Diagnosis not present

## 2021-06-03 DIAGNOSIS — I69354 Hemiplegia and hemiparesis following cerebral infarction affecting left non-dominant side: Secondary | ICD-10-CM | POA: Diagnosis not present

## 2021-06-03 DIAGNOSIS — I82442 Acute embolism and thrombosis of left tibial vein: Secondary | ICD-10-CM | POA: Diagnosis not present

## 2021-06-07 DIAGNOSIS — I82442 Acute embolism and thrombosis of left tibial vein: Secondary | ICD-10-CM | POA: Diagnosis not present

## 2021-06-07 DIAGNOSIS — H8111 Benign paroxysmal vertigo, right ear: Secondary | ICD-10-CM | POA: Diagnosis not present

## 2021-06-07 DIAGNOSIS — I69354 Hemiplegia and hemiparesis following cerebral infarction affecting left non-dominant side: Secondary | ICD-10-CM | POA: Diagnosis not present

## 2021-06-07 DIAGNOSIS — R1310 Dysphagia, unspecified: Secondary | ICD-10-CM | POA: Diagnosis not present

## 2021-06-07 DIAGNOSIS — I6521 Occlusion and stenosis of right carotid artery: Secondary | ICD-10-CM | POA: Diagnosis not present

## 2021-06-07 DIAGNOSIS — R42 Dizziness and giddiness: Secondary | ICD-10-CM | POA: Diagnosis not present

## 2021-06-10 DIAGNOSIS — I6529 Occlusion and stenosis of unspecified carotid artery: Secondary | ICD-10-CM | POA: Diagnosis not present

## 2021-06-10 DIAGNOSIS — I69354 Hemiplegia and hemiparesis following cerebral infarction affecting left non-dominant side: Secondary | ICD-10-CM | POA: Diagnosis not present

## 2021-06-10 DIAGNOSIS — R1310 Dysphagia, unspecified: Secondary | ICD-10-CM | POA: Diagnosis not present

## 2021-06-10 DIAGNOSIS — E785 Hyperlipidemia, unspecified: Secondary | ICD-10-CM | POA: Diagnosis not present

## 2021-06-10 DIAGNOSIS — I639 Cerebral infarction, unspecified: Secondary | ICD-10-CM | POA: Diagnosis not present

## 2021-06-10 DIAGNOSIS — I10 Essential (primary) hypertension: Secondary | ICD-10-CM | POA: Diagnosis not present

## 2021-06-10 DIAGNOSIS — I6521 Occlusion and stenosis of right carotid artery: Secondary | ICD-10-CM | POA: Diagnosis not present

## 2021-06-10 DIAGNOSIS — R42 Dizziness and giddiness: Secondary | ICD-10-CM | POA: Diagnosis not present

## 2021-06-10 DIAGNOSIS — H8111 Benign paroxysmal vertigo, right ear: Secondary | ICD-10-CM | POA: Diagnosis not present

## 2021-06-10 DIAGNOSIS — I2510 Atherosclerotic heart disease of native coronary artery without angina pectoris: Secondary | ICD-10-CM | POA: Diagnosis not present

## 2021-06-10 DIAGNOSIS — I82442 Acute embolism and thrombosis of left tibial vein: Secondary | ICD-10-CM | POA: Diagnosis not present

## 2021-06-10 DIAGNOSIS — I351 Nonrheumatic aortic (valve) insufficiency: Secondary | ICD-10-CM | POA: Diagnosis not present

## 2021-06-15 DIAGNOSIS — I82442 Acute embolism and thrombosis of left tibial vein: Secondary | ICD-10-CM | POA: Diagnosis not present

## 2021-06-15 DIAGNOSIS — H8111 Benign paroxysmal vertigo, right ear: Secondary | ICD-10-CM | POA: Diagnosis not present

## 2021-06-15 DIAGNOSIS — R1310 Dysphagia, unspecified: Secondary | ICD-10-CM | POA: Diagnosis not present

## 2021-06-15 DIAGNOSIS — I6521 Occlusion and stenosis of right carotid artery: Secondary | ICD-10-CM | POA: Diagnosis not present

## 2021-06-15 DIAGNOSIS — R42 Dizziness and giddiness: Secondary | ICD-10-CM | POA: Diagnosis not present

## 2021-06-15 DIAGNOSIS — I69354 Hemiplegia and hemiparesis following cerebral infarction affecting left non-dominant side: Secondary | ICD-10-CM | POA: Diagnosis not present

## 2021-06-17 DIAGNOSIS — H8111 Benign paroxysmal vertigo, right ear: Secondary | ICD-10-CM | POA: Diagnosis not present

## 2021-06-17 DIAGNOSIS — I69354 Hemiplegia and hemiparesis following cerebral infarction affecting left non-dominant side: Secondary | ICD-10-CM | POA: Diagnosis not present

## 2021-06-17 DIAGNOSIS — I6521 Occlusion and stenosis of right carotid artery: Secondary | ICD-10-CM | POA: Diagnosis not present

## 2021-06-17 DIAGNOSIS — I82442 Acute embolism and thrombosis of left tibial vein: Secondary | ICD-10-CM | POA: Diagnosis not present

## 2021-06-17 DIAGNOSIS — R42 Dizziness and giddiness: Secondary | ICD-10-CM | POA: Diagnosis not present

## 2021-06-17 DIAGNOSIS — R1310 Dysphagia, unspecified: Secondary | ICD-10-CM | POA: Diagnosis not present

## 2021-06-23 DIAGNOSIS — H8111 Benign paroxysmal vertigo, right ear: Secondary | ICD-10-CM | POA: Diagnosis not present

## 2021-06-23 DIAGNOSIS — I6521 Occlusion and stenosis of right carotid artery: Secondary | ICD-10-CM | POA: Diagnosis not present

## 2021-06-23 DIAGNOSIS — I69354 Hemiplegia and hemiparesis following cerebral infarction affecting left non-dominant side: Secondary | ICD-10-CM | POA: Diagnosis not present

## 2021-06-23 DIAGNOSIS — I82442 Acute embolism and thrombosis of left tibial vein: Secondary | ICD-10-CM | POA: Diagnosis not present

## 2021-06-23 DIAGNOSIS — R1310 Dysphagia, unspecified: Secondary | ICD-10-CM | POA: Diagnosis not present

## 2021-06-23 DIAGNOSIS — R42 Dizziness and giddiness: Secondary | ICD-10-CM | POA: Diagnosis not present

## 2021-06-24 DIAGNOSIS — H8111 Benign paroxysmal vertigo, right ear: Secondary | ICD-10-CM | POA: Diagnosis not present

## 2021-06-24 DIAGNOSIS — I69354 Hemiplegia and hemiparesis following cerebral infarction affecting left non-dominant side: Secondary | ICD-10-CM | POA: Diagnosis not present

## 2021-06-24 DIAGNOSIS — R42 Dizziness and giddiness: Secondary | ICD-10-CM | POA: Diagnosis not present

## 2021-06-24 DIAGNOSIS — R1310 Dysphagia, unspecified: Secondary | ICD-10-CM | POA: Diagnosis not present

## 2021-06-24 DIAGNOSIS — I82442 Acute embolism and thrombosis of left tibial vein: Secondary | ICD-10-CM | POA: Diagnosis not present

## 2021-06-24 DIAGNOSIS — I6521 Occlusion and stenosis of right carotid artery: Secondary | ICD-10-CM | POA: Diagnosis not present

## 2021-06-28 DIAGNOSIS — H8111 Benign paroxysmal vertigo, right ear: Secondary | ICD-10-CM | POA: Diagnosis not present

## 2021-06-28 DIAGNOSIS — I6521 Occlusion and stenosis of right carotid artery: Secondary | ICD-10-CM | POA: Diagnosis not present

## 2021-06-28 DIAGNOSIS — R1310 Dysphagia, unspecified: Secondary | ICD-10-CM | POA: Diagnosis not present

## 2021-06-28 DIAGNOSIS — I82442 Acute embolism and thrombosis of left tibial vein: Secondary | ICD-10-CM | POA: Diagnosis not present

## 2021-06-28 DIAGNOSIS — I69354 Hemiplegia and hemiparesis following cerebral infarction affecting left non-dominant side: Secondary | ICD-10-CM | POA: Diagnosis not present

## 2021-06-28 DIAGNOSIS — R42 Dizziness and giddiness: Secondary | ICD-10-CM | POA: Diagnosis not present

## 2021-06-28 NOTE — Progress Notes (Signed)
[facility] [address] [place]. [place] [address] [phone]       OFFICE FOLLOW-UP NOTE  [name] Date of Birth:  October 23, 1935 Medical Record Number:  [id number]    Primary neurologist: Dr. Leonie Man Reason for visit: Stroke follow-up   Chief Complaint  Patient presents with   Follow-up    RM 2 alone Pt is well and stable, no new concerns       HPI:   Update 06/28/2021 JM: Patient returns for 84-monthstroke follow-up unaccompanied.  Reports doing well since prior visit without any new stroke/TIA symptoms. Continues to do PT/OT at [facility] for continued left sided weakness, has been gradually improving. Ambulates with cane outdoors, in home does not use cane, denies any recent falls. Denies any new stroke symptoms. Maintains ADLs independently, does not drive, does do simple breakfast and lunch preparation, sister helps cook dinner. Daughter stays with him. Blood pressure today initially elevated but on recheck 122/62. Routinely monitors at home and typically 130s/60s.  Routinely followed by PCP, cardiology and vascular surgery.  No new concerns at this time.     History provided for reference purposes only Initial visit 03/24/2021 Dr. SLeonie Man [name] seen today for initial office visit following hospital admission for stroke in December 2022.  He is accompanied by his daughter.  History is obtained from them and review of electronic medical records and opossum reviewed pertinent available imaging films in PACS.[name] with a past medical history significant for hypertension arthritis CAD who presents with left-sided weakness left facial droop and left hemianopsia.  [facility] episode of left sided weakness. He was discharged 12/12/21 around 1330. Around 1700 he had a sudden onset of left-sided weakness.  EMS was called and he was brought to  the emergency department.  Family states that left leg weakness had completely resolved.  [facility] and he was discharged on Plavix. [facility] at 1753 [facility]  [facility] scan on admission showed no acute abnormality with aspect score of 10.  Repeat CT scan showed development of subtle low-density in the mesial temporal lobe hippocampus and inferior basal ganglia on the right side.  CT angiogram of the head and neck was negative for LVO but did show 55% distal CCA stenosis on the right with 50% stenosis of proximal right ICA with heavily calcification.  Carotid ultrasound was subsequently done which showed Only 1-39% right ICA stenosis but since it was heavily calcified it was felt to underrepresent the degree of stenosis and patient underwent elective right carotid endarterectomy by Dr. DDoren Custardon 12/20/2020 uneventfully.  Patient's states is done well since discharge.  Is doing home physical occupational therapy.  He can walk with a cane.  He has had no falls.  He is living at home with his daughter.  He had some difficulty with blood pressure fluctuation last week and was seen in the ER and his primary care physician is increase his blood pressure medicines which seem to be doing better now.  He had a CT scan done in the ER on 03/11/2021 which showed no acute abnormality and CT angiogram showed postoperative changes of right carotid endarterectomy without recurrent ICA stenosis.  There was severe bilateral vertebral ostial stenosis severe right external carotid artery stenosis.  Patient states he has been eating healthy and has lost some weight.  He was seen by his primary care physician who checked lipid profile which was satisfactory.  He was started on Eliquis for acute deep vein thrombosis involving posterior tibial veins on the left.  He is also on aspirin 81 mg for stroke prevention and tolerating both medications  well without bruising or bleeding.  He did does have a scheduled follow-up to see vascular surgery as well.  No new complaints today.      ROS:   14 system review of systems is positive for those listed in HPI and all other systems negative  PMH:  Past Medical History:  Diagnosis Date   Arthritis    Coronary artery disease    Depression    Hypertension     Social History:  Social History   Socioeconomic History   Marital status: Married    Spouse name: Not on file   Number of children: Not on file   Years of education: Not on file   Highest education level: 12th grade  Occupational History   Not on file  Tobacco Use   Smoking status: Former   Smokeless tobacco: Never   Tobacco comments:    Quit smoking 40 years ago  Vaping Use   Vaping Use: Never used  Substance and Sexual Activity   Alcohol use: No   Drug use: No   Sexual activity: Not on file  Other Topics Concern   Not on file  Social History Narrative   Lives with daughter    Some caffeine    Social Determinants of Health   Financial Resource Strain: Not on file  Food Insecurity: Not on file  Transportation Needs: Not on file  Physical Activity: Not on file  Stress: Not on file  Social Connections: Not on file  Intimate Partner Violence: Not on file    Medications:   Current Outpatient Medications on File Prior to Visit  Medication Sig Dispense Refill   acetaminophen (TYLENOL) 325 MG tablet Take 650 mg by mouth every 6 (six) hours as needed for headache (pain).     amLODipine (NORVASC) 10 MG tablet Take 1 tablet (10 mg total) by mouth daily. 30 tablet 0   aspirin EC 81 MG tablet Take 81 mg by mouth daily. Swallow whole.     benazepril (LOTENSIN) 20 MG tablet Take 20 mg by mouth 2 (two) times daily.     citalopram (CELEXA) 10 MG tablet Take 10 mg by mouth daily.     docusate sodium (COLACE) 100 MG capsule Take 2 capsules (200 mg total) by mouth daily after supper. 60 capsule 0   DULoxetine  (CYMBALTA) 30 MG capsule Take 30 mg by mouth daily.     Eszopiclone 3 MG TABS Take 3 mg by mouth at bedtime. Take immediately before bedtime     gabapentin (NEURONTIN) 100 MG capsule Take 1 capsule (100 mg total) by mouth at bedtime as needed (toe burning). 30 capsule 0   hydrALAZINE (APRESOLINE) 25 MG tablet Take 25 mg by mouth 3 (three) times daily.     melatonin 5 MG TABS Take 1 tablet (5 mg total) by mouth at bedtime. 30 tablet 0   metoprolol tartrate (LOPRESSOR) 50 MG tablet Take 1 tablet (50 mg total) by mouth 2 (two) times daily. 60 tablet 0   pantoprazole (PROTONIX) 40 MG tablet Take 1 tablet (40 mg total) by mouth daily. 30 tablet 0   rosuvastatin (CRESTOR) 40  MG tablet Take 1 tablet (40 mg total) by mouth at bedtime. 30 tablet 0   tamsulosin (FLOMAX) 0.4 MG CAPS capsule Take 1 capsule (0.4 mg total) by mouth daily. 30 capsule 0   traZODone (DESYREL) 50 MG tablet Take 1 tablet (50 mg total) by mouth at bedtime. 30 tablet 0   No current facility-administered medications on file prior to visit.    Allergies:  No Known Allergies  Physical Exam Today's Vitals   06/29/21 1422  BP: 122/62  Pulse: 62  Weight: 141 lb (64 kg)  Height: '5\' 7"'$  (1.702 m)   Body mass index is 22.08 kg/m.  General: well developed, well nourished, very pleasant elderly Caucasian male seated, in no evident distress Head: head normocephalic and atraumatic.  Neck: supple with no carotid or supraclavicular bruits Cardiovascular: regular rate and rhythm, no murmurs Musculoskeletal: no deformity Skin:  no rash/petichiae Vascular:  Normal pulses all extremities  Neurologic Exam Mental Status: Awake and fully alert. Oriented to place and time. Recent and remote memory intact. Attention span, concentration and fund of knowledge appropriate. Mood and affect appropriate.  Cranial Nerves: Pupils equal, briskly reactive to light. Extraocular movements full without nystagmus. Visual fields full to confrontation.  Hearing intact. Facial sensation intact.  Slight left lower facial weakness when he smiles., tongue, palate moves normally and symmetrically.  Motor: Normal bulk and tone. Normal strength in all tested extremity muscles except mild weakness of left grip and intrinsic hand muscles.  Orbits right over left upper extremity.   Sensory.: intact to touch ,pinprick .position and vibratory sensation.  Coordination: Rapid alternating movements slightly decreased left hand. Finger-to-nose and heel-to-shin performed accurately bilaterally. Gait and Station: Arises from chair without difficulty. Stance is normal. Gait demonstrates normal stride length and balance without use of AD (carrying cane). Tandem walking not attempted. Reflexes: 1+ and symmetric. Toes downgoing.       ASSESSMENT/PLAN: Very pleasant 86 year old Caucasian male with scattered right MCA/PCA border zone and right ACA infarcts in December 2022 treated with thrombolysis with IV TNK with good clinical outcome.  Stroke etiology moderate proximal right carotid stenosis treated with elective right carotid endarterectomy on 12/20/2020.  Vascular risk factors of hypertension, hyperlipidemia, coronary artery disease, borderline diabetes, carotid stenosis and left posterior tibial vein tx with Eliquis (completed 04/2021)   -Continue working with PT/OT for hopeful ongoing recovery -Continue aspirin 81 mg daily and Crestor 40 mg daily for secondary stroke prevention -Discussed close PCP follow-up for aggressive stroke risk factor management -maintain strict control of hypertension with blood pressure goal below 130/90, and lipids with LDL cholesterol goal below 70 mg/dL.  -Continue to follow with cardiology and vascular surgery as scheduled    Doing well from stroke standpoint without further recommendations and risk factors are managed by PCP. He may follow up PRN, as usual for our patients who are strictly being followed for stroke. If any new  neurological issues should arise, request PCP place referral for evaluation by one of our neurologists. Thank you.     CC:  Ronita Hipps, MD   I spent 31 minutes of face-to-face and non-face-to-face time with patient.  This included previsit chart review, lab review, study review, electronic health record documentation, patient education and discussion regarding history of prior stroke with residual deficits, secondary stroke prevention measures and aggressive stroke risk factor management and answered all the questions to patient satisfaction  Frann Rider, [facility]  [facility] [address] [place], [place] [address]  Phone [phone] Fax [phone] Note: This document was prepared with digital dictation and possible smart phrase technology. Any transcriptional errors that result from this process are unintentional.

## 2021-06-29 ENCOUNTER — Ambulatory Visit: Payer: PPO | Admitting: Adult Health

## 2021-06-29 ENCOUNTER — Encounter: Payer: Self-pay | Admitting: Adult Health

## 2021-06-29 VITALS — BP 122/62 | HR 62 | Ht 67.0 in | Wt 141.0 lb

## 2021-06-29 DIAGNOSIS — I63511 Cerebral infarction due to unspecified occlusion or stenosis of right middle cerebral artery: Secondary | ICD-10-CM | POA: Diagnosis not present

## 2021-06-29 DIAGNOSIS — I63239 Cerebral infarction due to unspecified occlusion or stenosis of unspecified carotid arteries: Secondary | ICD-10-CM

## 2021-06-29 NOTE — Patient Instructions (Signed)
Continue aspirin and Crestor for secondary stroke prevention  Continue to follow up with PCP regarding cholesterol and blood pressure management  Maintain strict control of hypertension with blood pressure goal below 130/90 and cholesterol with LDL cholesterol (bad cholesterol) goal below 70 mg/dL.   Signs of a Stroke? Follow the BEFAST method:  Balance Watch for a sudden loss of balance, trouble with coordination or vertigo Eyes Is there a sudden loss of vision in one or both eyes? Or double vision?  Face: Ask the person to smile. Does one side of the face droop or is it numb?  Arms: Ask the person to raise both arms. Does one arm drift downward? Is there weakness or numbness of a leg? Speech: Ask the person to repeat a simple phrase. Does the speech sound slurred/strange? Is the person confused ? Time: If you observe any of these signs, call 911.       Thank you for coming to see [place] at [facility]. I hope we have been able to provide you high quality care today.  You may receive a patient satisfaction survey over the next few weeks. We would appreciate your feedback and comments so that we may continue to improve ourselves and the health of our patients.

## 2021-07-06 DIAGNOSIS — R42 Dizziness and giddiness: Secondary | ICD-10-CM | POA: Diagnosis not present

## 2021-07-06 DIAGNOSIS — I69354 Hemiplegia and hemiparesis following cerebral infarction affecting left non-dominant side: Secondary | ICD-10-CM | POA: Diagnosis not present

## 2021-07-06 DIAGNOSIS — R1310 Dysphagia, unspecified: Secondary | ICD-10-CM | POA: Diagnosis not present

## 2021-07-06 DIAGNOSIS — I82442 Acute embolism and thrombosis of left tibial vein: Secondary | ICD-10-CM | POA: Diagnosis not present

## 2021-07-06 DIAGNOSIS — I6521 Occlusion and stenosis of right carotid artery: Secondary | ICD-10-CM | POA: Diagnosis not present

## 2021-07-06 DIAGNOSIS — H8111 Benign paroxysmal vertigo, right ear: Secondary | ICD-10-CM | POA: Diagnosis not present

## 2021-07-08 DIAGNOSIS — H8111 Benign paroxysmal vertigo, right ear: Secondary | ICD-10-CM | POA: Diagnosis not present

## 2021-07-08 DIAGNOSIS — I6521 Occlusion and stenosis of right carotid artery: Secondary | ICD-10-CM | POA: Diagnosis not present

## 2021-07-08 DIAGNOSIS — R42 Dizziness and giddiness: Secondary | ICD-10-CM | POA: Diagnosis not present

## 2021-07-08 DIAGNOSIS — R1310 Dysphagia, unspecified: Secondary | ICD-10-CM | POA: Diagnosis not present

## 2021-07-08 DIAGNOSIS — I69354 Hemiplegia and hemiparesis following cerebral infarction affecting left non-dominant side: Secondary | ICD-10-CM | POA: Diagnosis not present

## 2021-07-08 DIAGNOSIS — I82442 Acute embolism and thrombosis of left tibial vein: Secondary | ICD-10-CM | POA: Diagnosis not present

## 2021-07-13 DIAGNOSIS — I82442 Acute embolism and thrombosis of left tibial vein: Secondary | ICD-10-CM | POA: Diagnosis not present

## 2021-07-13 DIAGNOSIS — I6521 Occlusion and stenosis of right carotid artery: Secondary | ICD-10-CM | POA: Diagnosis not present

## 2021-07-13 DIAGNOSIS — R42 Dizziness and giddiness: Secondary | ICD-10-CM | POA: Diagnosis not present

## 2021-07-13 DIAGNOSIS — R1310 Dysphagia, unspecified: Secondary | ICD-10-CM | POA: Diagnosis not present

## 2021-07-13 DIAGNOSIS — H8111 Benign paroxysmal vertigo, right ear: Secondary | ICD-10-CM | POA: Diagnosis not present

## 2021-07-13 DIAGNOSIS — I69354 Hemiplegia and hemiparesis following cerebral infarction affecting left non-dominant side: Secondary | ICD-10-CM | POA: Diagnosis not present

## 2021-07-15 DIAGNOSIS — R1310 Dysphagia, unspecified: Secondary | ICD-10-CM | POA: Diagnosis not present

## 2021-07-15 DIAGNOSIS — R42 Dizziness and giddiness: Secondary | ICD-10-CM | POA: Diagnosis not present

## 2021-07-15 DIAGNOSIS — I6521 Occlusion and stenosis of right carotid artery: Secondary | ICD-10-CM | POA: Diagnosis not present

## 2021-07-15 DIAGNOSIS — I82442 Acute embolism and thrombosis of left tibial vein: Secondary | ICD-10-CM | POA: Diagnosis not present

## 2021-07-15 DIAGNOSIS — H8111 Benign paroxysmal vertigo, right ear: Secondary | ICD-10-CM | POA: Diagnosis not present

## 2021-07-15 DIAGNOSIS — I69354 Hemiplegia and hemiparesis following cerebral infarction affecting left non-dominant side: Secondary | ICD-10-CM | POA: Diagnosis not present

## 2021-07-18 DIAGNOSIS — Z6821 Body mass index (BMI) 21.0-21.9, adult: Secondary | ICD-10-CM | POA: Diagnosis not present

## 2021-07-18 DIAGNOSIS — I6930 Unspecified sequelae of cerebral infarction: Secondary | ICD-10-CM | POA: Diagnosis not present

## 2021-07-18 DIAGNOSIS — E7800 Pure hypercholesterolemia, unspecified: Secondary | ICD-10-CM | POA: Diagnosis not present

## 2021-07-18 DIAGNOSIS — R7309 Other abnormal glucose: Secondary | ICD-10-CM | POA: Diagnosis not present

## 2021-07-20 DIAGNOSIS — I6521 Occlusion and stenosis of right carotid artery: Secondary | ICD-10-CM | POA: Diagnosis not present

## 2021-07-20 DIAGNOSIS — H8111 Benign paroxysmal vertigo, right ear: Secondary | ICD-10-CM | POA: Diagnosis not present

## 2021-07-20 DIAGNOSIS — I82442 Acute embolism and thrombosis of left tibial vein: Secondary | ICD-10-CM | POA: Diagnosis not present

## 2021-07-20 DIAGNOSIS — I69354 Hemiplegia and hemiparesis following cerebral infarction affecting left non-dominant side: Secondary | ICD-10-CM | POA: Diagnosis not present

## 2021-07-20 DIAGNOSIS — R42 Dizziness and giddiness: Secondary | ICD-10-CM | POA: Diagnosis not present

## 2021-07-20 DIAGNOSIS — R1310 Dysphagia, unspecified: Secondary | ICD-10-CM | POA: Diagnosis not present

## 2021-07-22 DIAGNOSIS — H8111 Benign paroxysmal vertigo, right ear: Secondary | ICD-10-CM | POA: Diagnosis not present

## 2021-07-22 DIAGNOSIS — I69354 Hemiplegia and hemiparesis following cerebral infarction affecting left non-dominant side: Secondary | ICD-10-CM | POA: Diagnosis not present

## 2021-07-22 DIAGNOSIS — R1310 Dysphagia, unspecified: Secondary | ICD-10-CM | POA: Diagnosis not present

## 2021-07-22 DIAGNOSIS — R42 Dizziness and giddiness: Secondary | ICD-10-CM | POA: Diagnosis not present

## 2021-07-22 DIAGNOSIS — I6521 Occlusion and stenosis of right carotid artery: Secondary | ICD-10-CM | POA: Diagnosis not present

## 2021-07-22 DIAGNOSIS — I82442 Acute embolism and thrombosis of left tibial vein: Secondary | ICD-10-CM | POA: Diagnosis not present

## 2021-07-27 DIAGNOSIS — R1310 Dysphagia, unspecified: Secondary | ICD-10-CM | POA: Diagnosis not present

## 2021-07-27 DIAGNOSIS — H8111 Benign paroxysmal vertigo, right ear: Secondary | ICD-10-CM | POA: Diagnosis not present

## 2021-07-27 DIAGNOSIS — I82442 Acute embolism and thrombosis of left tibial vein: Secondary | ICD-10-CM | POA: Diagnosis not present

## 2021-07-27 DIAGNOSIS — R42 Dizziness and giddiness: Secondary | ICD-10-CM | POA: Diagnosis not present

## 2021-07-27 DIAGNOSIS — I6521 Occlusion and stenosis of right carotid artery: Secondary | ICD-10-CM | POA: Diagnosis not present

## 2021-07-27 DIAGNOSIS — I69354 Hemiplegia and hemiparesis following cerebral infarction affecting left non-dominant side: Secondary | ICD-10-CM | POA: Diagnosis not present

## 2021-07-28 DIAGNOSIS — G629 Polyneuropathy, unspecified: Secondary | ICD-10-CM | POA: Diagnosis not present

## 2021-07-28 DIAGNOSIS — Z8673 Personal history of transient ischemic attack (TIA), and cerebral infarction without residual deficits: Secondary | ICD-10-CM | POA: Diagnosis not present

## 2021-07-28 DIAGNOSIS — F3342 Major depressive disorder, recurrent, in full remission: Secondary | ICD-10-CM | POA: Diagnosis not present

## 2021-07-28 DIAGNOSIS — K219 Gastro-esophageal reflux disease without esophagitis: Secondary | ICD-10-CM | POA: Diagnosis not present

## 2021-07-28 DIAGNOSIS — I10 Essential (primary) hypertension: Secondary | ICD-10-CM | POA: Diagnosis not present

## 2021-07-28 DIAGNOSIS — E785 Hyperlipidemia, unspecified: Secondary | ICD-10-CM | POA: Diagnosis not present

## 2021-07-28 DIAGNOSIS — Z6822 Body mass index (BMI) 22.0-22.9, adult: Secondary | ICD-10-CM | POA: Diagnosis not present

## 2021-07-28 DIAGNOSIS — M19072 Primary osteoarthritis, left ankle and foot: Secondary | ICD-10-CM | POA: Diagnosis not present

## 2021-07-28 DIAGNOSIS — Z87891 Personal history of nicotine dependence: Secondary | ICD-10-CM | POA: Diagnosis not present

## 2021-07-28 DIAGNOSIS — N400 Enlarged prostate without lower urinary tract symptoms: Secondary | ICD-10-CM | POA: Diagnosis not present

## 2021-07-28 DIAGNOSIS — G4700 Insomnia, unspecified: Secondary | ICD-10-CM | POA: Diagnosis not present

## 2021-07-29 DIAGNOSIS — R1310 Dysphagia, unspecified: Secondary | ICD-10-CM | POA: Diagnosis not present

## 2021-07-29 DIAGNOSIS — I82442 Acute embolism and thrombosis of left tibial vein: Secondary | ICD-10-CM | POA: Diagnosis not present

## 2021-07-29 DIAGNOSIS — I69354 Hemiplegia and hemiparesis following cerebral infarction affecting left non-dominant side: Secondary | ICD-10-CM | POA: Diagnosis not present

## 2021-07-29 DIAGNOSIS — H8111 Benign paroxysmal vertigo, right ear: Secondary | ICD-10-CM | POA: Diagnosis not present

## 2021-07-29 DIAGNOSIS — R42 Dizziness and giddiness: Secondary | ICD-10-CM | POA: Diagnosis not present

## 2021-07-29 DIAGNOSIS — I6521 Occlusion and stenosis of right carotid artery: Secondary | ICD-10-CM | POA: Diagnosis not present

## 2021-08-01 DIAGNOSIS — E7800 Pure hypercholesterolemia, unspecified: Secondary | ICD-10-CM | POA: Diagnosis not present

## 2021-08-01 DIAGNOSIS — I2510 Atherosclerotic heart disease of native coronary artery without angina pectoris: Secondary | ICD-10-CM | POA: Diagnosis not present

## 2021-08-15 ENCOUNTER — Encounter: Payer: PPO | Admitting: Physical Medicine and Rehabilitation

## 2021-08-22 DIAGNOSIS — I351 Nonrheumatic aortic (valve) insufficiency: Secondary | ICD-10-CM | POA: Diagnosis not present

## 2021-08-25 DIAGNOSIS — Z8673 Personal history of transient ischemic attack (TIA), and cerebral infarction without residual deficits: Secondary | ICD-10-CM | POA: Diagnosis not present

## 2021-08-25 DIAGNOSIS — Z09 Encounter for follow-up examination after completed treatment for conditions other than malignant neoplasm: Secondary | ICD-10-CM | POA: Diagnosis not present

## 2021-08-29 DIAGNOSIS — Z125 Encounter for screening for malignant neoplasm of prostate: Secondary | ICD-10-CM | POA: Diagnosis not present

## 2021-08-29 DIAGNOSIS — R972 Elevated prostate specific antigen [PSA]: Secondary | ICD-10-CM | POA: Diagnosis not present

## 2021-08-29 DIAGNOSIS — R351 Nocturia: Secondary | ICD-10-CM | POA: Diagnosis not present

## 2021-08-29 DIAGNOSIS — N401 Enlarged prostate with lower urinary tract symptoms: Secondary | ICD-10-CM | POA: Diagnosis not present

## 2021-09-01 DIAGNOSIS — H81399 Other peripheral vertigo, unspecified ear: Secondary | ICD-10-CM | POA: Diagnosis not present

## 2021-09-07 DIAGNOSIS — Z6821 Body mass index (BMI) 21.0-21.9, adult: Secondary | ICD-10-CM | POA: Diagnosis not present

## 2021-09-07 DIAGNOSIS — R42 Dizziness and giddiness: Secondary | ICD-10-CM | POA: Diagnosis not present

## 2021-10-01 DIAGNOSIS — E7800 Pure hypercholesterolemia, unspecified: Secondary | ICD-10-CM | POA: Diagnosis not present

## 2021-10-01 DIAGNOSIS — I2510 Atherosclerotic heart disease of native coronary artery without angina pectoris: Secondary | ICD-10-CM | POA: Diagnosis not present

## 2021-11-16 DIAGNOSIS — H52223 Regular astigmatism, bilateral: Secondary | ICD-10-CM | POA: Diagnosis not present

## 2021-11-16 DIAGNOSIS — H5203 Hypermetropia, bilateral: Secondary | ICD-10-CM | POA: Diagnosis not present

## 2021-11-16 DIAGNOSIS — H401111 Primary open-angle glaucoma, right eye, mild stage: Secondary | ICD-10-CM | POA: Diagnosis not present

## 2021-11-16 DIAGNOSIS — H524 Presbyopia: Secondary | ICD-10-CM | POA: Diagnosis not present

## 2021-12-01 DIAGNOSIS — I2510 Atherosclerotic heart disease of native coronary artery without angina pectoris: Secondary | ICD-10-CM | POA: Diagnosis not present

## 2021-12-01 DIAGNOSIS — E7800 Pure hypercholesterolemia, unspecified: Secondary | ICD-10-CM | POA: Diagnosis not present

## 2022-01-13 ENCOUNTER — Other Ambulatory Visit (HOSPITAL_COMMUNITY): Payer: Self-pay

## 2022-01-16 DIAGNOSIS — Z125 Encounter for screening for malignant neoplasm of prostate: Secondary | ICD-10-CM | POA: Diagnosis not present

## 2022-01-16 DIAGNOSIS — Z6822 Body mass index (BMI) 22.0-22.9, adult: Secondary | ICD-10-CM | POA: Diagnosis not present

## 2022-01-16 DIAGNOSIS — Z9181 History of falling: Secondary | ICD-10-CM | POA: Diagnosis not present

## 2022-01-16 DIAGNOSIS — I10 Essential (primary) hypertension: Secondary | ICD-10-CM | POA: Diagnosis not present

## 2022-01-16 DIAGNOSIS — Z79899 Other long term (current) drug therapy: Secondary | ICD-10-CM | POA: Diagnosis not present

## 2022-01-16 DIAGNOSIS — Z0000 Encounter for general adult medical examination without abnormal findings: Secondary | ICD-10-CM | POA: Diagnosis not present

## 2022-01-16 DIAGNOSIS — N400 Enlarged prostate without lower urinary tract symptoms: Secondary | ICD-10-CM | POA: Diagnosis not present

## 2022-01-16 DIAGNOSIS — Z1331 Encounter for screening for depression: Secondary | ICD-10-CM | POA: Diagnosis not present

## 2022-01-16 DIAGNOSIS — R7309 Other abnormal glucose: Secondary | ICD-10-CM | POA: Diagnosis not present

## 2022-01-26 ENCOUNTER — Other Ambulatory Visit: Payer: Self-pay | Admitting: *Deleted

## 2022-01-26 DIAGNOSIS — I6521 Occlusion and stenosis of right carotid artery: Secondary | ICD-10-CM

## 2022-02-01 DIAGNOSIS — K219 Gastro-esophageal reflux disease without esophagitis: Secondary | ICD-10-CM | POA: Diagnosis not present

## 2022-02-01 DIAGNOSIS — I10 Essential (primary) hypertension: Secondary | ICD-10-CM | POA: Diagnosis not present

## 2022-02-08 ENCOUNTER — Ambulatory Visit (INDEPENDENT_AMBULATORY_CARE_PROVIDER_SITE_OTHER): Payer: PPO | Admitting: Physician Assistant

## 2022-02-08 ENCOUNTER — Ambulatory Visit (HOSPITAL_COMMUNITY)
Admission: RE | Admit: 2022-02-08 | Discharge: 2022-02-08 | Disposition: A | Discharge status: Home / Self Care | Payer: PPO | Source: Ambulatory Visit | Attending: Vascular Surgery | Admitting: Vascular Surgery

## 2022-02-08 VITALS — BP 195/85 | HR 67 | Temp 98.2°F | Ht 67.0 in | Wt 144.0 lb

## 2022-02-08 DIAGNOSIS — I6521 Occlusion and stenosis of right carotid artery: Secondary | ICD-10-CM | POA: Insufficient documentation

## 2022-02-09 ENCOUNTER — Encounter: Payer: Self-pay | Admitting: Physician Assistant

## 2022-02-09 NOTE — Progress Notes (Signed)
Office Note     CC:  follow up Requesting Provider:  Ronita Hipps, MD  HPI: Joel Peck is a 87 y.o. (1935-06-19) male who presents for surveillance of carotid artery stenosis.  He underwent right CEA by Dr. Scot Dock on 12/20/2020.  This was performed due to symptomatic right carotid stenosis.  He still has a persistent left hand weakness however states it is nearly at baseline.  He denies any strokelike symptoms including slurring speech or changes in vision.  He is a former smoker.  He is taking aspirin and statin daily.   Past Medical History:  Diagnosis Date   Arthritis    Coronary artery disease    Depression    Hypertension     Past Surgical History:  Procedure Laterality Date   CARDIAC CATHETERIZATION     ENDARTERECTOMY Right 12/20/2020   Procedure: RIGHT CAROTID ENDARTERECTOMY;  Surgeon: Angelia Mould, MD;  Location: [facility];  Service: Vascular;  Laterality: Right;   PATCH ANGIOPLASTY Right 12/20/2020   Procedure: [place];  Surgeon: Angelia Mould, MD;  Location: [facility];  Service: Vascular;  Laterality: Right;   TOTAL HIP ARTHROPLASTY Left 07/03/2018   Procedure: TOTAL HIP ARTHROPLASTY ANTERIOR APPROACH;  Surgeon: Rod Can, MD;  Location: [facility];  Service: Orthopedics;  Laterality: Left;    Social History   Socioeconomic History   Marital status: Married    Spouse name: Not on file   Number of children: Not on file   Years of education: Not on file   Highest education level: 12th grade  Occupational History   Not on file  Tobacco Use   Smoking status: Former   Smokeless tobacco: Never   Tobacco comments:    Quit smoking 40 years ago  Vaping Use   Vaping Use: Never used  Substance and Sexual Activity   Alcohol use: No   Drug use: No   Sexual activity: Not on file  Other Topics Concern   Not on file  Social History Narrative   Lives with daughter    Some caffeine    Social Determinants of  Health   Financial Resource Strain: Not on file  Food Insecurity: Not on file  Transportation Needs: Not on file  Physical Activity: Not on file  Stress: Not on file  Social Connections: Not on file  Intimate Partner Violence: Not on file    Family History  Problem Relation Age of Onset   Dementia Mother    Alzheimer's disease Mother    Stroke Father    Hypertension Sister     Current Outpatient Medications  Medication Sig Dispense Refill   acetaminophen (TYLENOL) 325 MG tablet Take 650 mg by mouth every 6 (six) hours as needed for headache (pain).     amLODipine (NORVASC) 10 MG tablet Take 1 tablet (10 mg total) by mouth daily. 30 tablet 0   aspirin EC 81 MG tablet Take 81 mg by mouth daily. Swallow whole.     benazepril (LOTENSIN) 20 MG tablet Take 20 mg by mouth 2 (two) times daily.     citalopram (CELEXA) 10 MG tablet Take 10 mg by mouth daily.     docusate sodium (COLACE) 100 MG capsule Take 2 capsules (200 mg total) by mouth daily after supper. 60 capsule 0   DULoxetine (CYMBALTA) 30 MG capsule Take 30 mg by mouth daily.     Eszopiclone 3 MG TABS Take 3 mg by mouth at bedtime. Take immediately  before bedtime     gabapentin (NEURONTIN) 100 MG capsule Take 1 capsule (100 mg total) by mouth at bedtime as needed (toe burning). 30 capsule 0   hydrALAZINE (APRESOLINE) 25 MG tablet Take 25 mg by mouth 3 (three) times daily.     melatonin 5 MG TABS Take 1 tablet (5 mg total) by mouth at bedtime. 30 tablet 0   metoprolol tartrate (LOPRESSOR) 50 MG tablet Take 1 tablet (50 mg total) by mouth 2 (two) times daily. 60 tablet 0   pantoprazole (PROTONIX) 40 MG tablet Take 1 tablet (40 mg total) by mouth daily. 30 tablet 0   rosuvastatin (CRESTOR) 40 MG tablet Take 1 tablet (40 mg total) by mouth at bedtime. 30 tablet 0   tamsulosin (FLOMAX) 0.4 MG CAPS capsule Take 1 capsule (0.4 mg total) by mouth daily. 30 capsule 0   traZODone (DESYREL) 50 MG tablet Take 1 tablet (50 mg total) by mouth  at bedtime. 30 tablet 0   No current facility-administered medications for this visit.    No Known Allergies   REVIEW OF SYSTEMS:   '[X]'$  denotes positive finding, '[ ]'$  denotes negative finding Cardiac  Comments:  Chest pain or chest pressure:    Shortness of breath upon exertion:    Short of breath when lying flat:    Irregular heart rhythm:        Vascular    Pain in calf, thigh, or hip brought on by ambulation:    Pain in feet at night that wakes you up from your sleep:     Blood clot in your veins:    Leg swelling:         Pulmonary    Oxygen at home:    Productive cough:     Wheezing:         Neurologic    Sudden weakness in arms or legs:     Sudden numbness in arms or legs:     Sudden onset of difficulty speaking or slurred speech:    Temporary loss of vision in one eye:     Problems with dizziness:         Gastrointestinal    Blood in stool:     Vomited blood:         Genitourinary    Burning when urinating:     Blood in urine:        Psychiatric    Major depression:         Hematologic    Bleeding problems:    Problems with blood clotting too easily:        Skin    Rashes or ulcers:        Constitutional    Fever or chills:      PHYSICAL EXAMINATION:  Vitals:   02/08/22 1413 02/08/22 1418  BP: (!) 196/86 (!) 195/85  Pulse: 67   Temp: 98.2 F (36.8 C)   TempSrc: Temporal   SpO2: 99%   Weight: 144 lb (65.3 kg)   Height: '5\' 7"'$  (1.702 m)     General:  WDWN in NAD; vital signs documented above Gait: Not observed HENT: WNL, normocephalic Pulmonary: normal non-labored breathing , without Rales, rhonchi,  wheezing Cardiac: regular HR Abdomen: soft, NT, no masses Skin: without rashes Vascular Exam/Pulses:  Right Left  Radial 2+ (normal) 2+ (normal)   Extremities: without ischemic changes, without Gangrene , without cellulitis; without open wounds;  Musculoskeletal: no muscle wasting or atrophy  Neurologic: A&O X 3;  No focal weakness or  paresthesias are detected Psychiatric:  The pt has Normal affect.   Non-Invasive Vascular Imaging:   Right carotid endarterectomy stent patent Left ICA 40 to 59% stenosis   ASSESSMENT/PLAN:: 87 y.o. male here for follow up for carotid artery stenosis  -Carotid duplex demonstrates a patent right carotid endarterectomy site without restenosis.  Left ICA with moderate stenosis.  No indication for revascularization at this time.  We will continue surveillance with annual carotid duplex.  He will continue his aspirin and statin daily.  He knows to call/return office sooner with any questions or concerns.   Dagoberto Ligas, PA-C [facility] [phone]  Clinic MD:   Donzetta Matters

## 2022-02-17 DIAGNOSIS — I351 Nonrheumatic aortic (valve) insufficiency: Secondary | ICD-10-CM | POA: Diagnosis not present

## 2022-02-17 DIAGNOSIS — I2510 Atherosclerotic heart disease of native coronary artery without angina pectoris: Secondary | ICD-10-CM | POA: Diagnosis not present

## 2022-02-17 DIAGNOSIS — I10 Essential (primary) hypertension: Secondary | ICD-10-CM | POA: Diagnosis not present

## 2022-02-17 DIAGNOSIS — I6529 Occlusion and stenosis of unspecified carotid artery: Secondary | ICD-10-CM | POA: Diagnosis not present

## 2022-02-17 DIAGNOSIS — I639 Cerebral infarction, unspecified: Secondary | ICD-10-CM | POA: Diagnosis not present

## 2022-02-17 DIAGNOSIS — E785 Hyperlipidemia, unspecified: Secondary | ICD-10-CM | POA: Diagnosis not present

## 2022-03-02 DIAGNOSIS — R351 Nocturia: Secondary | ICD-10-CM | POA: Diagnosis not present

## 2022-03-02 DIAGNOSIS — R972 Elevated prostate specific antigen [PSA]: Secondary | ICD-10-CM | POA: Diagnosis not present

## 2022-03-02 DIAGNOSIS — N401 Enlarged prostate with lower urinary tract symptoms: Secondary | ICD-10-CM | POA: Diagnosis not present

## 2022-03-20 DIAGNOSIS — G4700 Insomnia, unspecified: Secondary | ICD-10-CM | POA: Diagnosis not present

## 2022-03-20 DIAGNOSIS — I2510 Atherosclerotic heart disease of native coronary artery without angina pectoris: Secondary | ICD-10-CM | POA: Diagnosis not present

## 2022-03-20 DIAGNOSIS — I69354 Hemiplegia and hemiparesis following cerebral infarction affecting left non-dominant side: Secondary | ICD-10-CM | POA: Diagnosis not present

## 2022-03-20 DIAGNOSIS — I6521 Occlusion and stenosis of right carotid artery: Secondary | ICD-10-CM | POA: Diagnosis not present

## 2022-03-20 DIAGNOSIS — K5900 Constipation, unspecified: Secondary | ICD-10-CM | POA: Diagnosis not present

## 2022-03-20 DIAGNOSIS — H401111 Primary open-angle glaucoma, right eye, mild stage: Secondary | ICD-10-CM | POA: Diagnosis not present

## 2022-03-20 DIAGNOSIS — M1990 Unspecified osteoarthritis, unspecified site: Secondary | ICD-10-CM | POA: Diagnosis not present

## 2022-03-20 DIAGNOSIS — I10 Essential (primary) hypertension: Secondary | ICD-10-CM | POA: Diagnosis not present

## 2022-03-20 DIAGNOSIS — F419 Anxiety disorder, unspecified: Secondary | ICD-10-CM | POA: Diagnosis not present

## 2022-03-20 DIAGNOSIS — I700 Atherosclerosis of aorta: Secondary | ICD-10-CM | POA: Diagnosis not present

## 2022-03-20 DIAGNOSIS — E785 Hyperlipidemia, unspecified: Secondary | ICD-10-CM | POA: Diagnosis not present

## 2022-03-20 DIAGNOSIS — K219 Gastro-esophageal reflux disease without esophagitis: Secondary | ICD-10-CM | POA: Diagnosis not present

## 2022-05-02 DIAGNOSIS — I10 Essential (primary) hypertension: Secondary | ICD-10-CM | POA: Diagnosis not present

## 2022-05-02 DIAGNOSIS — K219 Gastro-esophageal reflux disease without esophagitis: Secondary | ICD-10-CM | POA: Diagnosis not present

## 2022-05-31 DIAGNOSIS — H524 Presbyopia: Secondary | ICD-10-CM | POA: Diagnosis not present

## 2022-05-31 DIAGNOSIS — Z961 Presence of intraocular lens: Secondary | ICD-10-CM | POA: Diagnosis not present

## 2022-05-31 DIAGNOSIS — H5203 Hypermetropia, bilateral: Secondary | ICD-10-CM | POA: Diagnosis not present

## 2022-05-31 DIAGNOSIS — H52223 Regular astigmatism, bilateral: Secondary | ICD-10-CM | POA: Diagnosis not present

## 2022-05-31 DIAGNOSIS — H401111 Primary open-angle glaucoma, right eye, mild stage: Secondary | ICD-10-CM | POA: Diagnosis not present

## 2022-06-02 DIAGNOSIS — K219 Gastro-esophageal reflux disease without esophagitis: Secondary | ICD-10-CM | POA: Diagnosis not present

## 2022-06-02 DIAGNOSIS — I10 Essential (primary) hypertension: Secondary | ICD-10-CM | POA: Diagnosis not present

## 2022-07-11 DIAGNOSIS — J209 Acute bronchitis, unspecified: Secondary | ICD-10-CM | POA: Diagnosis not present

## 2022-07-11 DIAGNOSIS — R051 Acute cough: Secondary | ICD-10-CM | POA: Diagnosis not present

## 2022-07-11 DIAGNOSIS — J019 Acute sinusitis, unspecified: Secondary | ICD-10-CM | POA: Diagnosis not present

## 2022-07-11 DIAGNOSIS — R0981 Nasal congestion: Secondary | ICD-10-CM | POA: Diagnosis not present

## 2022-07-12 DIAGNOSIS — Z961 Presence of intraocular lens: Secondary | ICD-10-CM | POA: Diagnosis not present

## 2022-07-12 DIAGNOSIS — H401134 Primary open-angle glaucoma, bilateral, indeterminate stage: Secondary | ICD-10-CM | POA: Diagnosis not present

## 2022-08-29 DIAGNOSIS — R351 Nocturia: Secondary | ICD-10-CM | POA: Diagnosis not present

## 2022-08-29 DIAGNOSIS — N401 Enlarged prostate with lower urinary tract symptoms: Secondary | ICD-10-CM | POA: Diagnosis not present

## 2022-08-29 DIAGNOSIS — R972 Elevated prostate specific antigen [PSA]: Secondary | ICD-10-CM | POA: Diagnosis not present

## 2022-09-23 IMAGING — CT CT ANGIO HEAD-NECK (W OR W/O PERF)
2 of 11 series · 6 of 34 positions shown · IV contrast (OMNI 350)
Comparison: Prior study from 12/12/2020

CLINICAL DATA: Initial evaluation for acute headache.

EXAM:
CT ANGIOGRAPHY HEAD AND NECK
TECHNIQUE: Multidetector CT imaging of the head and neck was performed using
the standard protocol during bolus administration of intravenous
contrast. Multiplanar CT image reconstructions and MIPs were
obtained to evaluate the vascular anatomy. Carotid stenosis
measurements (when applicable) are obtained utilizing NASCET
criteria, using the distal internal carotid diameter as the
denominator.

[Series 10: sagittal · sagittal · 0.37mm/px · 1 of 67 slices shown]
[im 30/67  soft-tissue]
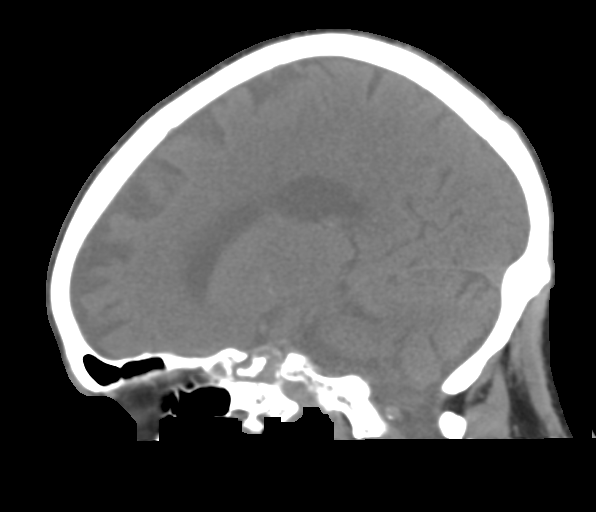

[Series 12: cta neck axial · axial · 0.45mm/px · z∈[-231,-13]mm · 5 of 328 slices shown]
[im 55/328  soft-tissue]
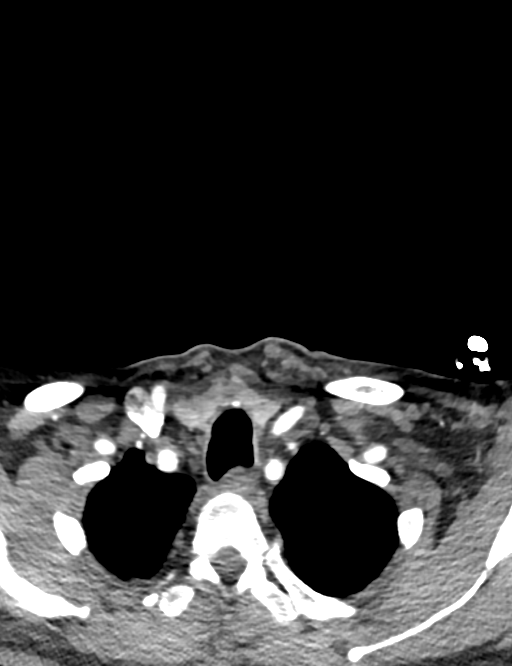
[im 110/328  bone]
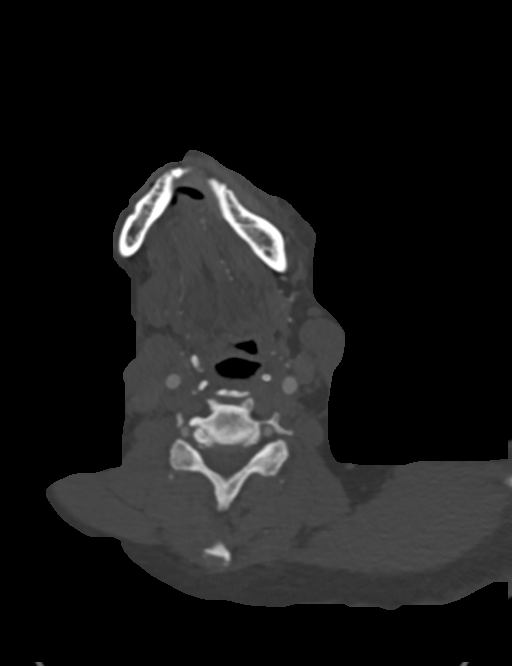
[im 164/328  soft-tissue]
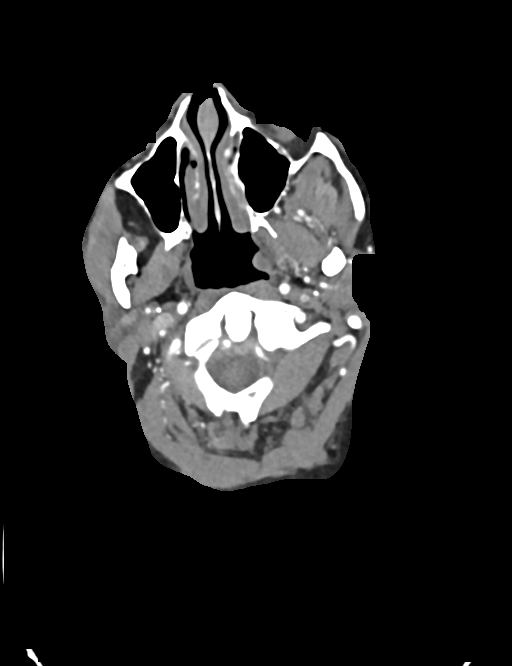
[im 219/328  bone]
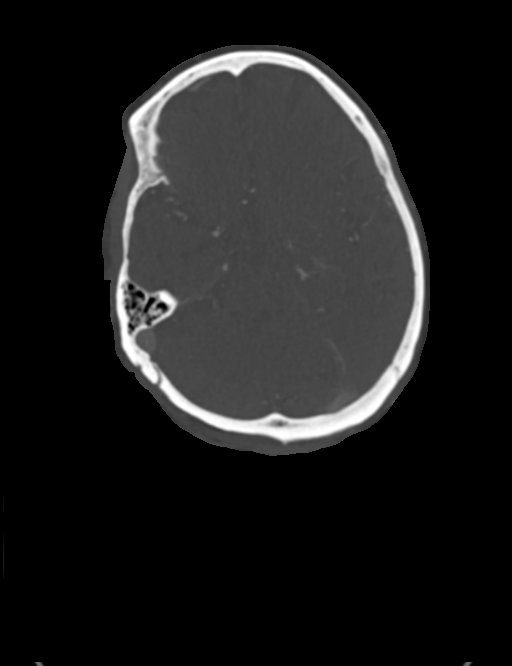
[im 273/328  soft-tissue]
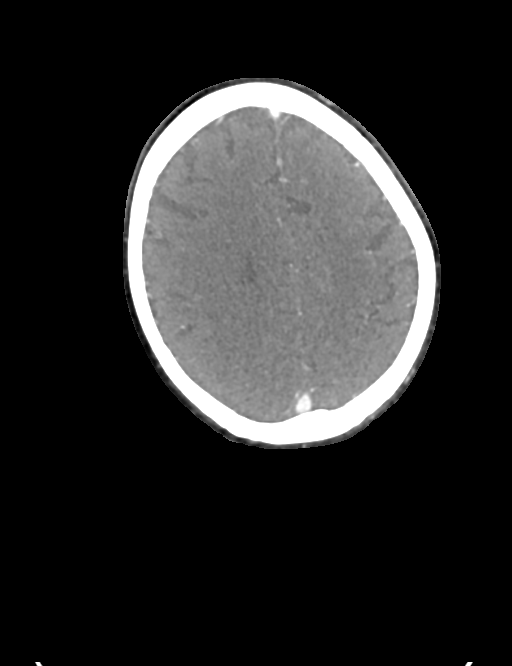

[6 of 34 positions shown; findings below may reference images not displayed]

RADIATION DOSE REDUCTION: This exam was performed according to the
departmental dose-optimization program which includes automated
exposure control, adjustment of the mA and/or kV according to
patient size and/or use of iterative reconstruction technique.

CONTRAST:  75mL OMNIPAQUE IOHEXOL 350 MG/ML SOLN
FINDINGS: CT HEAD FINDINGS

Brain: Mild age-related cerebral atrophy with chronic small vessel
ischemic disease. No acute intracranial hemorrhage. No acute large
vessel territory infarct. No mass lesion or midline shift. No
hydrocephalus or extra-axial fluid collection.

Vascular: No hyperdense vessel. Calcified atherosclerosis present at
skull base.

Skull: Small lipoma noted at the right frontal scalp. Calvarium
intact.

Sinuses: Chronic left sphenoid sinusitis. Paranasal sinuses are
otherwise clear. No mastoid effusion.

Orbits: No acute finding.

Review of the MIP images confirms the above findings

CTA NECK FINDINGS

Aortic arch: Visualized aortic arch normal in caliber with normal
branch pattern. Moderate atheromatous change about the arch and
origin of the great vessels without hemodynamically significant
stenosis.

Right carotid system: Scattered nonstenotic plaque noted within the
right CCA. Postoperative changes from interval right carotid
endarterectomy about the right carotid bulb without residual or
recurrent stenosis. Right ICA patent distally without stenosis or
dissection. Note made of probable occlusion of the right external
carotid artery at its origin. External carotid artery branches are
perfused distally, possibly retrograde in collateral nature.

Left carotid system: Left common carotid artery patent from its
origin to the bifurcation without stenosis. Mixed plaque about the
left bifurcation carotid bulb/proximal left ICA with up to 30%
stenosis by NASCET criteria, stable. Left ICA patent distally
without stenosis or dissection.

Vertebral arteries: Both vertebral arteries arise from the
subclavian arteries. No proximal subclavian artery stenosis.
Atheromatous plaque at the origins of both vertebral arteries with
associated severe ostial stenoses. Vertebral arteries otherwise
patent without stenosis or dissection.

Skeleton: No discrete or worrisome osseous lesions. Patient is
edentulous. Moderate multilevel cervical spondylosis.

Other neck: No other acute soft tissue abnormality within the neck.

Upper chest: Visualized upper chest demonstrates no acute finding.

Review of the MIP images confirms the above findings

CTA HEAD FINDINGS

Anterior circulation: Petrous segments patent bilaterally.
Atheromatous plaque within the carotid siphons with associated mild
to moderate multifocal narrowing. A1 segments patent bilaterally.
Normal anterior communicating artery complex. Anterior cerebral
arteries patent without stenosis. No M1 stenosis or occlusion.
Normal MCA bifurcations. Distal MCA branches perfused and fairly
symmetric.

Posterior circulation: Right vertebral artery patent without
stenosis. Atheromatous change at the mid left V4 segment with mild
stenosis. Right PICA patent. Left PICA not seen. Basilar patent
without stenosis. Superior cerebral arteries patent bilaterally.
Both PCAs primarily supplied via the basilar. Atheromatous
irregularity throughout the PCAs bilaterally with associated
moderate right P2 stenosis (series 13, image 109). More mild
multifocal narrowing elsewhere about the PCAs.

Venous sinuses: Patent.

Anatomic variants: None significant.  No aneurysm.

Review of the MIP images confirms the above findings
IMPRESSION: CT HEAD IMPRESSION:

1. No acute intracranial abnormality.
2. Mild age-related cerebral atrophy with chronic small vessel
ischemic disease.
3. Chronic left sphenoid sinusitis.

CTA HEAD AND NECK:

1. No large vessel occlusion.
2. Postoperative changes from interval right carotid endarterectomy
without residual or recurrent ICA stenosis. High-grade stenosis
versus occlusion at the origin of the right external carotid artery.
3. Atheromatous plaque about the left carotid bulb/proximal left ICA
with up to 30% stenosis by NASCET criteria, stable.
4. Severe ostial stenoses at the origins of both vertebral arteries,
stable.
5. Scattered intracranial atherosclerotic disease, with associated
mild to moderate stenoses about the carotid siphons and moderate
right P2 stenosis, stable.

## 2022-10-05 DIAGNOSIS — I10 Essential (primary) hypertension: Secondary | ICD-10-CM | POA: Diagnosis not present

## 2022-10-05 DIAGNOSIS — R42 Dizziness and giddiness: Secondary | ICD-10-CM | POA: Diagnosis not present

## 2022-10-05 DIAGNOSIS — Z23 Encounter for immunization: Secondary | ICD-10-CM | POA: Diagnosis not present

## 2022-10-05 DIAGNOSIS — Z79899 Other long term (current) drug therapy: Secondary | ICD-10-CM | POA: Diagnosis not present

## 2022-10-05 DIAGNOSIS — Z6822 Body mass index (BMI) 22.0-22.9, adult: Secondary | ICD-10-CM | POA: Diagnosis not present

## 2022-10-05 DIAGNOSIS — E7800 Pure hypercholesterolemia, unspecified: Secondary | ICD-10-CM | POA: Diagnosis not present

## 2022-10-05 DIAGNOSIS — R7309 Other abnormal glucose: Secondary | ICD-10-CM | POA: Diagnosis not present

## 2022-10-05 DIAGNOSIS — N400 Enlarged prostate without lower urinary tract symptoms: Secondary | ICD-10-CM | POA: Diagnosis not present

## 2022-10-05 DIAGNOSIS — G629 Polyneuropathy, unspecified: Secondary | ICD-10-CM | POA: Diagnosis not present

## 2022-10-27 DIAGNOSIS — Z8673 Personal history of transient ischemic attack (TIA), and cerebral infarction without residual deficits: Secondary | ICD-10-CM | POA: Diagnosis not present

## 2022-10-27 DIAGNOSIS — I351 Nonrheumatic aortic (valve) insufficiency: Secondary | ICD-10-CM | POA: Diagnosis not present

## 2022-10-27 DIAGNOSIS — I10 Essential (primary) hypertension: Secondary | ICD-10-CM | POA: Diagnosis not present

## 2022-10-27 DIAGNOSIS — I2510 Atherosclerotic heart disease of native coronary artery without angina pectoris: Secondary | ICD-10-CM | POA: Diagnosis not present

## 2022-10-27 DIAGNOSIS — I6529 Occlusion and stenosis of unspecified carotid artery: Secondary | ICD-10-CM | POA: Diagnosis not present

## 2022-10-27 DIAGNOSIS — E785 Hyperlipidemia, unspecified: Secondary | ICD-10-CM | POA: Diagnosis not present

## 2023-01-10 DIAGNOSIS — Z961 Presence of intraocular lens: Secondary | ICD-10-CM | POA: Diagnosis not present

## 2023-01-10 DIAGNOSIS — H401134 Primary open-angle glaucoma, bilateral, indeterminate stage: Secondary | ICD-10-CM | POA: Diagnosis not present

## 2023-01-31 ENCOUNTER — Other Ambulatory Visit: Payer: Self-pay

## 2023-01-31 DIAGNOSIS — I6521 Occlusion and stenosis of right carotid artery: Secondary | ICD-10-CM

## 2023-02-14 ENCOUNTER — Encounter: Payer: Self-pay | Admitting: Physician Assistant

## 2023-02-14 ENCOUNTER — Ambulatory Visit (HOSPITAL_COMMUNITY)
Admission: RE | Admit: 2023-02-14 | Discharge: 2023-02-14 | Disposition: A | Discharge status: Home / Self Care | Payer: PPO | Source: Ambulatory Visit | Attending: Vascular Surgery | Admitting: Vascular Surgery

## 2023-02-14 ENCOUNTER — Ambulatory Visit: Payer: PPO | Admitting: Physician Assistant

## 2023-02-14 VITALS — BP 194/84 | HR 82 | Temp 98.7°F | Ht 67.0 in | Wt 149.3 lb

## 2023-02-14 DIAGNOSIS — I6523 Occlusion and stenosis of bilateral carotid arteries: Secondary | ICD-10-CM

## 2023-02-14 DIAGNOSIS — I6521 Occlusion and stenosis of right carotid artery: Secondary | ICD-10-CM | POA: Diagnosis not present

## 2023-02-14 NOTE — Progress Notes (Signed)
HISTORY AND PHYSICAL     CC:  follow up. Requesting Provider:  Marylen Ponto, MD  HPI: This is a 88 y.o. male here for follow up for carotid artery stenosis.  Pt is s/p right CEA for symptomatic carotid artery stenosis on 12/20/2020 by Dr. Edilia Bo.    Pt was last seen 02/08/2022 and at that time he was not having any new neurological sx.   Pt returns today for follow up & here with his daughter.  He says she takes good care of him.    Pt denies any amaurosis fugax, speech difficulties, or new weakness, numbness, paralysis or clumsiness or facial droop.  He continues to have left sided weakness from previous stroke.  He states he has not smoked in about 50 years.  He denies any cramping in his calves with walking but does get some cramps at night.  He does not have any non healing wounds.   The pt is on a statin for cholesterol management.  The pt is on a daily aspirin.   Other AC:  none The pt is on CCB, ACEI, BB, hydralazine for hypertension.   The pt is not on medication for diabetes Tobacco hx:  former-quit 50 years ago    Past Medical History:  Diagnosis Date   Arthritis    Coronary artery disease    Depression    Hypertension     Past Surgical History:  Procedure Laterality Date   CARDIAC CATHETERIZATION     ENDARTERECTOMY Right 12/20/2020   Procedure: RIGHT CAROTID ENDARTERECTOMY;  Surgeon: Chuck Hint, MD;  Location: [facility];  Service: Vascular;  Laterality: Right;   PATCH ANGIOPLASTY Right 12/20/2020   Procedure: PATCH ANGIOPLASTY USING Livia Snellen BIOLOGIC PATCH;  Surgeon: Chuck Hint, MD;  Location: [facility] OR;  Service: Vascular;  Laterality: Right;   TOTAL HIP ARTHROPLASTY Left 07/03/2018   Procedure: TOTAL HIP ARTHROPLASTY ANTERIOR APPROACH;  Surgeon: Samson Frederic, MD;  Location: [facility];  Service: Orthopedics;  Laterality: Left;    No Known Allergies  Current Outpatient Medications  Medication Sig Dispense Refill   acetaminophen (TYLENOL) 325  MG tablet Take 650 mg by mouth every 6 (six) hours as needed for headache (pain).     amLODipine (NORVASC) 10 MG tablet Take 1 tablet (10 mg total) by mouth daily. 30 tablet 0   aspirin EC 81 MG tablet Take 81 mg by mouth daily. Swallow whole.     benazepril (LOTENSIN) 20 MG tablet Take 20 mg by mouth 2 (two) times daily.     citalopram (CELEXA) 10 MG tablet Take 10 mg by mouth daily.     docusate sodium (COLACE) 100 MG capsule Take 2 capsules (200 mg total) by mouth daily after supper. 60 capsule 0   DULoxetine (CYMBALTA) 30 MG capsule Take 30 mg by mouth daily.     Eszopiclone 3 MG TABS Take 3 mg by mouth at bedtime. Take immediately before bedtime     gabapentin (NEURONTIN) 100 MG capsule Take 1 capsule (100 mg total) by mouth at bedtime as needed (toe burning). 30 capsule 0   hydrALAZINE (APRESOLINE) 25 MG tablet Take 25 mg by mouth 3 (three) times daily.     melatonin 5 MG TABS Take 1 tablet (5 mg total) by mouth at bedtime. 30 tablet 0   metoprolol tartrate (LOPRESSOR) 50 MG tablet Take 1 tablet (50 mg total) by mouth 2 (two) times daily. 60 tablet 0   pantoprazole (PROTONIX) 40 MG tablet Take 1  tablet (40 mg total) by mouth daily. 30 tablet 0   rosuvastatin (CRESTOR) 40 MG tablet Take 1 tablet (40 mg total) by mouth at bedtime. 30 tablet 0   tamsulosin (FLOMAX) 0.4 MG CAPS capsule Take 1 capsule (0.4 mg total) by mouth daily. 30 capsule 0   traZODone (DESYREL) 50 MG tablet Take 1 tablet (50 mg total) by mouth at bedtime. 30 tablet 0   No current facility-administered medications for this visit.    Family History  Problem Relation Age of Onset   Dementia Mother    Alzheimer's disease Mother    Stroke Father    Hypertension Sister     Social History   Socioeconomic History   Marital status: Married    Spouse name: Not on file   Number of children: Not on file   Years of education: Not on file   Highest education level: 12th grade  Occupational History   Not on file  Tobacco  Use   Smoking status: Former   Smokeless tobacco: Never   Tobacco comments:    Quit smoking 40 years ago  Vaping Use   Vaping status: Never Used  Substance and Sexual Activity   Alcohol use: No   Drug use: No   Sexual activity: Not on file  Other Topics Concern   Not on file  Social History Narrative   Lives with daughter    Some caffeine    Social Drivers of Corporate investment banker Strain: Not on file  Food Insecurity: Not on file  Transportation Needs: Not on file  Physical Activity: Not on file  Stress: Not on file  Social Connections: Not on file  Intimate Partner Violence: Not on file     REVIEW OF SYSTEMS:   [X]  denotes positive finding, [ ]  denotes negative finding Cardiac  Comments:  Chest pain or chest pressure:    Shortness of breath upon exertion:    Short of breath when lying flat:    Irregular heart rhythm:        Vascular    Pain in calf, thigh, or hip brought on by ambulation:    Pain in feet at night that wakes you up from your sleep:     Blood clot in your veins:    Leg swelling:         Pulmonary    Oxygen at home:    Productive cough:     Wheezing:         Neurologic    Sudden weakness in arms or legs:     Sudden numbness in arms or legs:     Sudden onset of difficulty speaking or slurred speech:    Temporary loss of vision in one eye:     Problems with dizziness:         Gastrointestinal    Blood in stool:     Vomited blood:         Genitourinary    Burning when urinating:     Blood in urine:        Psychiatric    Major depression:         Hematologic    Bleeding problems:    Problems with blood clotting too easily:        Skin    Rashes or ulcers:        Constitutional    Fever or chills:      PHYSICAL EXAMINATION:  Today's Vitals   02/14/23 1444  BP: Marland Kitchen)  194/84  Pulse: 82  Temp: 98.7 F (37.1 C)  SpO2: 96%  Weight: 149 lb 4.8 oz (67.7 kg)  Height: 5\' 7"  (1.702 m)  PainSc: 0-No pain   Body mass index  is 23.38 kg/m.   General:  WDWN in NAD; vital signs documented above Gait: Not observed HENT: WNL, normocephalic Pulmonary: normal non-labored breathing Cardiac: regular HR, with carotid bruits bilaterally Skin: without rashes Vascular Exam/Pulses:  Right Left  Radial 2+ (normal) 2+ (normal)   Extremities: without open wounds Musculoskeletal: no muscle wasting or atrophy  Neurologic: A&O X 3; moving all extremities equally; speech is fluent/normal Psychiatric:  The pt has Normal affect.   Non-Invasive Vascular Imaging:   Carotid Duplex on 02/14/2023 Right:  1-39% ICA stenosis Left:  40-59% ICA stenosis Vertebrals:  Right vertebral artery demonstrates antegrade flow. Left vertebral artery antegrade and atypical.  Subclavians: Normal flow hemodynamics were seen in bilateral subclavian arteries.   Previous Carotid duplex on 02/08/2022: Right: 1-39% ICA stenosis Left:   40-59% ICA stenosis    ASSESSMENT/PLAN:: 88 y.o. male here for follow up carotid artery stenosis and s/p right CEA for symptomatic carotid artery stenosis on 12/20/2020 by Dr. Edilia Bo.    -duplex today reveals right ICA stenosis remains 1-39% and left remains 40-59%.  He does not have any new symptoms.   -his blood pressure is elevated today.  He takes his blood pressure daily at home and systolic runs in the 130's.   -discussed s/s of stroke with pt and he understands should he develop any of these sx, he will go to the nearest ER or call 911. -pt will f/u in one year with carotid duplex -pt will call sooner should he have any issues. -continue statin/asa    Doreatha Massed, [facility] [phone]  Clinic MD:  Randie Heinz

## 2023-03-13 DIAGNOSIS — Z1339 Encounter for screening examination for other mental health and behavioral disorders: Secondary | ICD-10-CM | POA: Diagnosis not present

## 2023-03-13 DIAGNOSIS — Z6822 Body mass index (BMI) 22.0-22.9, adult: Secondary | ICD-10-CM | POA: Diagnosis not present

## 2023-03-13 DIAGNOSIS — Z0000 Encounter for general adult medical examination without abnormal findings: Secondary | ICD-10-CM | POA: Diagnosis not present

## 2023-03-13 DIAGNOSIS — Z1331 Encounter for screening for depression: Secondary | ICD-10-CM | POA: Diagnosis not present

## 2023-03-13 DIAGNOSIS — E119 Type 2 diabetes mellitus without complications: Secondary | ICD-10-CM | POA: Diagnosis not present

## 2023-05-03 DIAGNOSIS — R351 Nocturia: Secondary | ICD-10-CM | POA: Diagnosis not present

## 2023-05-03 DIAGNOSIS — N401 Enlarged prostate with lower urinary tract symptoms: Secondary | ICD-10-CM | POA: Diagnosis not present

## 2023-05-03 DIAGNOSIS — R972 Elevated prostate specific antigen [PSA]: Secondary | ICD-10-CM | POA: Diagnosis not present

## 2023-06-22 DIAGNOSIS — E785 Hyperlipidemia, unspecified: Secondary | ICD-10-CM | POA: Diagnosis not present

## 2023-06-22 DIAGNOSIS — I351 Nonrheumatic aortic (valve) insufficiency: Secondary | ICD-10-CM | POA: Diagnosis not present

## 2023-06-22 DIAGNOSIS — Z8673 Personal history of transient ischemic attack (TIA), and cerebral infarction without residual deficits: Secondary | ICD-10-CM | POA: Diagnosis not present

## 2023-06-22 DIAGNOSIS — I10 Essential (primary) hypertension: Secondary | ICD-10-CM | POA: Diagnosis not present

## 2023-06-22 DIAGNOSIS — I6529 Occlusion and stenosis of unspecified carotid artery: Secondary | ICD-10-CM | POA: Diagnosis not present

## 2023-06-22 DIAGNOSIS — I2510 Atherosclerotic heart disease of native coronary artery without angina pectoris: Secondary | ICD-10-CM | POA: Diagnosis not present

## 2023-07-10 DIAGNOSIS — Z6823 Body mass index (BMI) 23.0-23.9, adult: Secondary | ICD-10-CM | POA: Diagnosis not present

## 2023-07-10 DIAGNOSIS — R252 Cramp and spasm: Secondary | ICD-10-CM | POA: Diagnosis not present

## 2023-07-10 DIAGNOSIS — G2581 Restless legs syndrome: Secondary | ICD-10-CM | POA: Diagnosis not present

## 2023-07-18 DIAGNOSIS — Z961 Presence of intraocular lens: Secondary | ICD-10-CM | POA: Diagnosis not present

## 2023-07-18 DIAGNOSIS — H401134 Primary open-angle glaucoma, bilateral, indeterminate stage: Secondary | ICD-10-CM | POA: Diagnosis not present

## 2023-09-17 DIAGNOSIS — Z6822 Body mass index (BMI) 22.0-22.9, adult: Secondary | ICD-10-CM | POA: Diagnosis not present

## 2023-09-17 DIAGNOSIS — R634 Abnormal weight loss: Secondary | ICD-10-CM | POA: Diagnosis not present

## 2023-09-17 DIAGNOSIS — I69354 Hemiplegia and hemiparesis following cerebral infarction affecting left non-dominant side: Secondary | ICD-10-CM | POA: Diagnosis not present

## 2023-09-26 DIAGNOSIS — M26601 Right temporomandibular joint disorder, unspecified: Secondary | ICD-10-CM | POA: Diagnosis not present

## 2023-09-26 DIAGNOSIS — M272 Inflammatory conditions of jaws: Secondary | ICD-10-CM | POA: Diagnosis not present

## 2024-03-27 ENCOUNTER — Ambulatory Visit

## 2024-03-27 ENCOUNTER — Ambulatory Visit (HOSPITAL_COMMUNITY)
